# Patient Record
Sex: Female | Born: 1975 | Race: White | Hispanic: No | Marital: Married | Smoking: Never smoker
Health system: Southern US, Community
[De-identification: ages and names within clinical notes are randomized; demographics above are authoritative.]

## PROBLEM LIST (undated history)

## (undated) DIAGNOSIS — I471 Supraventricular tachycardia, unspecified: Secondary | ICD-10-CM

## (undated) DIAGNOSIS — Q210 Ventricular septal defect: Secondary | ICD-10-CM

## (undated) DIAGNOSIS — R002 Palpitations: Secondary | ICD-10-CM

## (undated) DIAGNOSIS — R112 Nausea with vomiting, unspecified: Secondary | ICD-10-CM

## (undated) DIAGNOSIS — K829 Disease of gallbladder, unspecified: Secondary | ICD-10-CM

## (undated) DIAGNOSIS — E8881 Metabolic syndrome: Secondary | ICD-10-CM

## (undated) DIAGNOSIS — Z8742 Personal history of other diseases of the female genital tract: Secondary | ICD-10-CM

## (undated) DIAGNOSIS — I499 Cardiac arrhythmia, unspecified: Secondary | ICD-10-CM

## (undated) DIAGNOSIS — N8003 Adenomyosis of the uterus: Secondary | ICD-10-CM

## (undated) DIAGNOSIS — J069 Acute upper respiratory infection, unspecified: Secondary | ICD-10-CM

## (undated) DIAGNOSIS — N800 Endometriosis of uterus: Secondary | ICD-10-CM

## (undated) DIAGNOSIS — D219 Benign neoplasm of connective and other soft tissue, unspecified: Secondary | ICD-10-CM

## (undated) DIAGNOSIS — Z9189 Other specified personal risk factors, not elsewhere classified: Secondary | ICD-10-CM

## (undated) DIAGNOSIS — R51 Headache: Secondary | ICD-10-CM

## (undated) DIAGNOSIS — R7303 Prediabetes: Secondary | ICD-10-CM

## (undated) DIAGNOSIS — M1990 Unspecified osteoarthritis, unspecified site: Secondary | ICD-10-CM

## (undated) DIAGNOSIS — Z9889 Other specified postprocedural states: Secondary | ICD-10-CM

## (undated) DIAGNOSIS — E559 Vitamin D deficiency, unspecified: Secondary | ICD-10-CM

## (undated) DIAGNOSIS — E282 Polycystic ovarian syndrome: Secondary | ICD-10-CM

## (undated) DIAGNOSIS — Z91018 Allergy to other foods: Secondary | ICD-10-CM

## (undated) DIAGNOSIS — N809 Endometriosis, unspecified: Secondary | ICD-10-CM

## (undated) DIAGNOSIS — D649 Anemia, unspecified: Secondary | ICD-10-CM

## (undated) DIAGNOSIS — E119 Type 2 diabetes mellitus without complications: Secondary | ICD-10-CM

## (undated) DIAGNOSIS — K219 Gastro-esophageal reflux disease without esophagitis: Secondary | ICD-10-CM

## (undated) DIAGNOSIS — R519 Headache, unspecified: Secondary | ICD-10-CM

## (undated) DIAGNOSIS — N970 Female infertility associated with anovulation: Secondary | ICD-10-CM

## (undated) HISTORY — DX: Polycystic ovarian syndrome: E28.2

## (undated) HISTORY — DX: Type 2 diabetes mellitus without complications: E11.9

## (undated) HISTORY — DX: Supraventricular tachycardia: I47.1

## (undated) HISTORY — DX: Benign neoplasm of connective and other soft tissue, unspecified: D21.9

## (undated) HISTORY — PX: OTHER SURGICAL HISTORY: SHX169

## (undated) HISTORY — DX: Palpitations: R00.2

## (undated) HISTORY — PX: DILATION AND CURETTAGE OF UTERUS: SHX78

## (undated) HISTORY — DX: Prediabetes: R73.03

## (undated) HISTORY — DX: Unspecified osteoarthritis, unspecified site: M19.90

## (undated) HISTORY — DX: Metabolic syndrome: E88.81

## (undated) HISTORY — DX: Disease of gallbladder, unspecified: K82.9

## (undated) HISTORY — DX: Endometriosis, unspecified: N80.9

## (undated) HISTORY — DX: Female infertility associated with anovulation: N97.0

## (undated) HISTORY — DX: Supraventricular tachycardia, unspecified: I47.10

## (undated) HISTORY — DX: Vitamin D deficiency, unspecified: E55.9

## (undated) HISTORY — DX: Metabolic syndrome: E88.810

## (undated) HISTORY — DX: Allergy to other foods: Z91.018

## (undated) HISTORY — DX: Other specified personal risk factors, not elsewhere classified: Z91.89

## (undated) HISTORY — DX: Endometriosis of uterus: N80.0

## (undated) HISTORY — DX: Adenomyosis of the uterus: N80.03

---

## 2001-01-01 ENCOUNTER — Ambulatory Visit (HOSPITAL_COMMUNITY): Admission: RE | Admit: 2001-01-01 | Discharge: 2001-01-01 | Payer: Self-pay | Admitting: Family Medicine

## 2001-01-01 ENCOUNTER — Encounter: Payer: Self-pay | Admitting: Family Medicine

## 2001-01-12 ENCOUNTER — Inpatient Hospital Stay (HOSPITAL_COMMUNITY): Admission: AD | Admit: 2001-01-12 | Discharge: 2001-01-14 | Payer: Self-pay | Admitting: Family Medicine

## 2001-01-12 ENCOUNTER — Encounter: Payer: Self-pay | Admitting: Family Medicine

## 2001-01-14 ENCOUNTER — Other Ambulatory Visit: Admission: RE | Admit: 2001-01-14 | Discharge: 2001-01-14 | Payer: Self-pay | Admitting: Obstetrics and Gynecology

## 2001-03-27 ENCOUNTER — Encounter (HOSPITAL_COMMUNITY): Admission: RE | Admit: 2001-03-27 | Discharge: 2001-04-26 | Payer: Self-pay | Admitting: Family Medicine

## 2001-05-06 HISTORY — PX: CHOLECYSTECTOMY: SHX55

## 2002-01-17 ENCOUNTER — Inpatient Hospital Stay (HOSPITAL_COMMUNITY): Admission: EM | Admit: 2002-01-17 | Discharge: 2002-01-20 | Payer: Self-pay | Admitting: Emergency Medicine

## 2002-01-18 ENCOUNTER — Encounter: Payer: Self-pay | Admitting: Internal Medicine

## 2002-01-27 ENCOUNTER — Ambulatory Visit (HOSPITAL_COMMUNITY): Admission: RE | Admit: 2002-01-27 | Discharge: 2002-01-27 | Payer: Self-pay | Admitting: Family Medicine

## 2002-01-27 ENCOUNTER — Encounter: Payer: Self-pay | Admitting: Family Medicine

## 2002-01-29 ENCOUNTER — Encounter: Payer: Self-pay | Admitting: Family Medicine

## 2002-01-29 ENCOUNTER — Encounter (HOSPITAL_COMMUNITY): Admission: RE | Admit: 2002-01-29 | Discharge: 2002-02-28 | Payer: Self-pay | Admitting: Family Medicine

## 2002-08-02 ENCOUNTER — Other Ambulatory Visit: Admission: RE | Admit: 2002-08-02 | Discharge: 2002-08-02 | Payer: Self-pay | Admitting: Obstetrics and Gynecology

## 2002-08-12 ENCOUNTER — Encounter: Payer: Self-pay | Admitting: Obstetrics and Gynecology

## 2002-08-12 ENCOUNTER — Inpatient Hospital Stay (HOSPITAL_COMMUNITY): Admission: RE | Admit: 2002-08-12 | Discharge: 2002-08-12 | Payer: Self-pay | Admitting: Obstetrics and Gynecology

## 2003-09-15 ENCOUNTER — Other Ambulatory Visit: Admission: RE | Admit: 2003-09-15 | Discharge: 2003-09-15 | Payer: Self-pay | Admitting: Gynecology

## 2004-07-20 ENCOUNTER — Ambulatory Visit (HOSPITAL_COMMUNITY): Admission: RE | Admit: 2004-07-20 | Discharge: 2004-07-20 | Payer: Self-pay | Admitting: Family Medicine

## 2004-11-07 ENCOUNTER — Other Ambulatory Visit: Admission: RE | Admit: 2004-11-07 | Discharge: 2004-11-07 | Payer: Self-pay | Admitting: Gynecology

## 2007-11-12 ENCOUNTER — Ambulatory Visit (HOSPITAL_COMMUNITY): Admission: RE | Admit: 2007-11-12 | Discharge: 2007-11-12 | Payer: Self-pay | Admitting: Obstetrics and Gynecology

## 2007-11-18 ENCOUNTER — Other Ambulatory Visit: Admission: RE | Admit: 2007-11-18 | Discharge: 2007-11-18 | Payer: Self-pay | Admitting: Gynecology

## 2008-09-14 ENCOUNTER — Inpatient Hospital Stay (HOSPITAL_COMMUNITY): Admission: AD | Admit: 2008-09-14 | Discharge: 2008-09-18 | Payer: Self-pay | Admitting: Obstetrics & Gynecology

## 2008-09-15 ENCOUNTER — Encounter (INDEPENDENT_AMBULATORY_CARE_PROVIDER_SITE_OTHER): Payer: Self-pay | Admitting: Obstetrics and Gynecology

## 2008-09-20 ENCOUNTER — Ambulatory Visit: Admission: RE | Admit: 2008-09-20 | Discharge: 2008-09-20 | Payer: Self-pay | Admitting: Obstetrics and Gynecology

## 2008-09-20 ENCOUNTER — Encounter: Admission: RE | Admit: 2008-09-20 | Discharge: 2008-10-20 | Payer: Self-pay | Admitting: Obstetrics and Gynecology

## 2008-10-21 ENCOUNTER — Encounter: Admission: RE | Admit: 2008-10-21 | Discharge: 2008-11-19 | Payer: Self-pay | Admitting: Obstetrics and Gynecology

## 2008-11-20 ENCOUNTER — Encounter: Admission: RE | Admit: 2008-11-20 | Discharge: 2008-12-16 | Payer: Self-pay | Admitting: Obstetrics and Gynecology

## 2010-05-31 ENCOUNTER — Other Ambulatory Visit (HOSPITAL_COMMUNITY): Payer: Self-pay | Admitting: Obstetrics and Gynecology

## 2010-05-31 DIAGNOSIS — IMO0002 Reserved for concepts with insufficient information to code with codable children: Secondary | ICD-10-CM

## 2010-05-31 DIAGNOSIS — Q210 Ventricular septal defect: Secondary | ICD-10-CM

## 2010-05-31 DIAGNOSIS — E282 Polycystic ovarian syndrome: Secondary | ICD-10-CM

## 2010-05-31 DIAGNOSIS — O169 Unspecified maternal hypertension, unspecified trimester: Secondary | ICD-10-CM

## 2010-06-22 ENCOUNTER — Other Ambulatory Visit (HOSPITAL_COMMUNITY): Payer: Self-pay | Admitting: Obstetrics and Gynecology

## 2010-06-22 ENCOUNTER — Ambulatory Visit (HOSPITAL_COMMUNITY)
Admission: RE | Admit: 2010-06-22 | Discharge: 2010-06-22 | Disposition: A | Discharge status: Home / Self Care | Payer: Commercial Managed Care - PPO | Source: Ambulatory Visit | Attending: Obstetrics and Gynecology | Admitting: Obstetrics and Gynecology

## 2010-06-22 ENCOUNTER — Encounter (HOSPITAL_COMMUNITY): Payer: Self-pay

## 2010-06-22 DIAGNOSIS — Q210 Ventricular septal defect: Secondary | ICD-10-CM

## 2010-06-22 DIAGNOSIS — O352XX0 Maternal care for (suspected) hereditary disease in fetus, not applicable or unspecified: Secondary | ICD-10-CM | POA: Insufficient documentation

## 2010-06-22 DIAGNOSIS — O34219 Maternal care for unspecified type scar from previous cesarean delivery: Secondary | ICD-10-CM | POA: Insufficient documentation

## 2010-06-22 DIAGNOSIS — O337XX0 Maternal care for disproportion due to other fetal deformities, not applicable or unspecified: Secondary | ICD-10-CM

## 2010-06-22 DIAGNOSIS — E282 Polycystic ovarian syndrome: Secondary | ICD-10-CM

## 2010-06-22 DIAGNOSIS — O10019 Pre-existing essential hypertension complicating pregnancy, unspecified trimester: Secondary | ICD-10-CM | POA: Insufficient documentation

## 2010-06-22 DIAGNOSIS — IMO0002 Reserved for concepts with insufficient information to code with codable children: Secondary | ICD-10-CM

## 2010-06-22 DIAGNOSIS — O169 Unspecified maternal hypertension, unspecified trimester: Secondary | ICD-10-CM

## 2010-07-20 ENCOUNTER — Ambulatory Visit (HOSPITAL_COMMUNITY)
Admission: RE | Admit: 2010-07-20 | Discharge: 2010-07-20 | Disposition: A | Discharge status: Home / Self Care | Payer: Commercial Managed Care - PPO | Source: Ambulatory Visit | Attending: Obstetrics and Gynecology | Admitting: Obstetrics and Gynecology

## 2010-07-20 ENCOUNTER — Other Ambulatory Visit (HOSPITAL_COMMUNITY): Payer: Self-pay | Admitting: Obstetrics and Gynecology

## 2010-07-20 DIAGNOSIS — O352XX0 Maternal care for (suspected) hereditary disease in fetus, not applicable or unspecified: Secondary | ICD-10-CM

## 2010-07-20 DIAGNOSIS — O337XX0 Maternal care for disproportion due to other fetal deformities, not applicable or unspecified: Secondary | ICD-10-CM

## 2010-07-20 DIAGNOSIS — O10019 Pre-existing essential hypertension complicating pregnancy, unspecified trimester: Secondary | ICD-10-CM | POA: Insufficient documentation

## 2010-07-20 DIAGNOSIS — O34219 Maternal care for unspecified type scar from previous cesarean delivery: Secondary | ICD-10-CM

## 2010-07-20 DIAGNOSIS — O2690 Pregnancy related conditions, unspecified, unspecified trimester: Secondary | ICD-10-CM

## 2010-08-04 ENCOUNTER — Inpatient Hospital Stay (HOSPITAL_COMMUNITY)
Admission: AD | Admit: 2010-08-04 | Discharge: 2010-08-04 | Disposition: A | Discharge status: Home / Self Care | Payer: 59 | Source: Ambulatory Visit | Attending: Obstetrics and Gynecology | Admitting: Obstetrics and Gynecology

## 2010-08-04 DIAGNOSIS — O99891 Other specified diseases and conditions complicating pregnancy: Secondary | ICD-10-CM | POA: Insufficient documentation

## 2010-08-04 DIAGNOSIS — W010XXA Fall on same level from slipping, tripping and stumbling without subsequent striking against object, initial encounter: Secondary | ICD-10-CM | POA: Insufficient documentation

## 2010-08-14 LAB — COMPREHENSIVE METABOLIC PANEL
Albumin: 2.1 g/dL — ABNORMAL LOW (ref 3.5–5.2)
Alkaline Phosphatase: 230 U/L — ABNORMAL HIGH (ref 39–117)
BUN: 10 mg/dL (ref 6–23)
BUN: 7 mg/dL (ref 6–23)
CO2: 23 mEq/L (ref 19–32)
Chloride: 105 mEq/L (ref 96–112)
Chloride: 106 mEq/L (ref 96–112)
Creatinine, Ser: 0.46 mg/dL (ref 0.4–1.2)
GFR calc non Af Amer: >60 mL/min (ref >60)
GFR calc non Af Amer: >60 mL/min (ref >60)
Glucose, Bld: 120 mg/dL — ABNORMAL HIGH (ref 70–99)
Glucose, Bld: 129 mg/dL — ABNORMAL HIGH (ref 70–99)
Potassium: 3.7 mEq/L (ref 3.5–5.1)
Total Bilirubin: 0.2 mg/dL — ABNORMAL LOW (ref 0.3–1.2)
Total Bilirubin: 0.4 mg/dL (ref 0.3–1.2)

## 2010-08-14 LAB — URINALYSIS, ROUTINE W REFLEX MICROSCOPIC
Bilirubin Urine: NEGATIVE
Ketones, ur: NEGATIVE mg/dL
Nitrite: NEGATIVE
Urobilinogen, UA: 0.2 mg/dL (ref 0.0–1.0)

## 2010-08-14 LAB — URIC ACID
Uric Acid, Serum: 4.6 mg/dL (ref 2.4–7.0)
Uric Acid, Serum: 5.1 mg/dL (ref 2.4–7.0)

## 2010-08-14 LAB — URINE MICROSCOPIC-ADD ON

## 2010-08-14 LAB — CBC
HCT: 30.8 % — ABNORMAL LOW (ref 36.0–46.0)
HCT: 36.3 % (ref 36.0–46.0)
Hemoglobin: 12.6 g/dL (ref 12.0–15.0)
Hemoglobin: 12.6 g/dL (ref 12.0–15.0)
MCHC: 33.1 g/dL (ref 30.0–36.0)
MCV: 91.8 fL (ref 78.0–100.0)
Platelets: 196 10*3/uL (ref 150–400)
Platelets: 226 10*3/uL (ref 150–400)
RBC: 4.16 MIL/uL (ref 3.87–5.11)
WBC: 10.9 10*3/uL — ABNORMAL HIGH (ref 4.0–10.5)
WBC: 13.4 10*3/uL — ABNORMAL HIGH (ref 4.0–10.5)
WBC: 9.9 10*3/uL (ref 4.0–10.5)

## 2010-08-14 LAB — LACTATE DEHYDROGENASE
LDH: 133 U/L (ref 94–250)
LDH: 139 U/L (ref 94–250)

## 2010-08-31 ENCOUNTER — Ambulatory Visit (HOSPITAL_COMMUNITY)
Admission: RE | Admit: 2010-08-31 | Discharge: 2010-08-31 | Disposition: A | Discharge status: Home / Self Care | Payer: Commercial Managed Care - PPO | Source: Ambulatory Visit | Attending: Obstetrics and Gynecology | Admitting: Obstetrics and Gynecology

## 2010-08-31 ENCOUNTER — Other Ambulatory Visit (HOSPITAL_COMMUNITY): Payer: Self-pay | Admitting: Obstetrics and Gynecology

## 2010-08-31 DIAGNOSIS — E669 Obesity, unspecified: Secondary | ICD-10-CM | POA: Insufficient documentation

## 2010-08-31 DIAGNOSIS — O10019 Pre-existing essential hypertension complicating pregnancy, unspecified trimester: Secondary | ICD-10-CM | POA: Insufficient documentation

## 2010-08-31 DIAGNOSIS — O352XX0 Maternal care for (suspected) hereditary disease in fetus, not applicable or unspecified: Secondary | ICD-10-CM | POA: Insufficient documentation

## 2010-08-31 DIAGNOSIS — O34219 Maternal care for unspecified type scar from previous cesarean delivery: Secondary | ICD-10-CM | POA: Insufficient documentation

## 2010-08-31 DIAGNOSIS — O99210 Obesity complicating pregnancy, unspecified trimester: Secondary | ICD-10-CM | POA: Insufficient documentation

## 2010-09-18 NOTE — Discharge Summary (Signed)
Christina Rodriguez, Christina Rodriguez                ACCOUNT NO.:  [id number]   MEDICAL RECORD NO.:  [id number]          PATIENT TYPE:  INP   LOCATION:  9115                          FACILITY:  [facility]   PHYSICIAN:  Malva Limes, M.D.    DATE OF BIRTH:  June 09, 1975   DATE OF ADMISSION:  09/14/2008  DATE OF DISCHARGE:  09/18/2008                               DISCHARGE SUMMARY   FINAL DIAGNOSES:  Intrauterine gestation at 40 weeks, suspected fetal  macrosomia, pregnancy-induced high blood pressure, history of  supraventricular tachycardia and a small ventricular septal defect,  asthma.   PROCEDURE:  Primary low segment transverse cesarean section.   SURGEON:  Randye Lobo, MD   ASSISTANT:  Gretchen Short, [facility]   COMPLICATIONS:  None.   This 35 year old G1, P0 presents at 25 weeks' gestation for evaluation  of pregnancy-induced hypertension.  The patient had been seen in the  office on May 12 for routine visit, was noted to have some elevated  blood pressures.  The patient had been on Cardizem to control her  supraventricular tachycardia during the pregnancy, but now she is  experiencing some edema and a trace of protein in her urine.  The  patient's antepartum course up to this point have been complicated by  this history of supraventricular tachycardia.  The patient also has a  small ventricular septal defect.  She had been cleared by Cardiology for  delivery.  She also has asthma and had been on Advair and albuterol  during her pregnancy and is obese.  Otherwise, antepartum course, her  blood pressure did not start elevating until this point.  The patient  upon admission did have an ultrasound that documented estimated fetal  weight of over 4300 g, BPP was 8/8 and presentation was vertex.  The  patient was continued to be elevated and monitored for her blood  pressure and signs of preeclampsia.  At this point, a decision was made  to proceed with a cesarean section secondary to suspected fetal  macrosomia.  The patient does not have gestational diabetes mellitus.  The patient was taken to the operating room on Sep 15, 2008, by Dr.  Conley Simmonds where a primary low segment transverse cesarean section was  performed with the delivery of an 8-pound 7-ounce female infant with  Apgars of 9 and 9.  Delivery went without complications.  The patient's  postoperative course was benign without any significant fevers.  The  patient was felt ready for discharge on postoperative day #3.  She was  sent home on a regular diet, told to decrease activities, told to  continue her prenatal vitamins, was given a prescription for Darvocet-N  100 to take 1-2 every 4-6 hours as needed for pain, told she could use  ibuprofen up to 600 mg every 6 hours as needed for pain, was to follow  up in our office in 4 weeks.  Instructions and precautions were reviewed  with the patient.   LABORATORY FINDINGS ON DISCHARGE:  The patient had a hemoglobin of 9.8,  white blood cell count of 9.9, and platelets of 196,000.  The patient's  PIH panel remained within normal limits throughout her hospital course.      Leilani Able, P.A.-C.    ______________________________  Malva Limes, M.D.    MB/MEDQ  D:  09/28/2008  T:  09/29/2008  Job:  [id number]

## 2010-09-18 NOTE — Op Note (Signed)
Christina Rodriguez, Christina Rodriguez                ACCOUNT NO.:  [id number]   MEDICAL RECORD NO.:  [id number]          PATIENT TYPE:  INP   LOCATION:  9172                          FACILITY:  [facility]   PHYSICIAN:  Randye Lobo, M.D.   DATE OF BIRTH:  1976/01/22   DATE OF PROCEDURE:  09/15/2008  DATE OF DISCHARGE:                               OPERATIVE REPORT   PREOPERATIVE DIAGNOSES:  1. Intrauterine gestation at 40 weeks.  2. Suspected fetal macrosomia.  3. Pregnancy-induced hypertension.   POSTOPERATIVE DIAGNOSES:  1. Intrauterine gestation at 40 weeks.  2. Suspected fetal macrosomia.  3. Pregnancy-induced hypertension.   PROCEDURE:  Primary low segment transverse cesarean section.   SURGEON:  Randye Lobo, MD   ASSISTANT:  Gretchen Short, PA-C   ANESTHESIA:  Spinal.   INTRAVENOUS FLUIDS:  2050 mL Ringer's lactate.   ESTIMATED BLOOD LOSS:  400 mL.   URINE OUTPUT:  125 mL.   COMPLICATIONS:  None.   INDICATIONS FOR THE PROCEDURE:  The patient is a 35 year old gravida 1,  para 10 Caucasian female at 35 weeks' gestation, who was seen in the  office on Sep 14, 2008 for routine visit and was noted to have slightly  elevated blood pressure.  The patient had been on Cardizem to control  supraventricular tachycardia during the pregnancy.  The patient was also  exhibiting 2+ lower extremity edema and trace protein in the urine.  The  patient was sent to the Maternity Admissions Unit for evaluation where  she had a PIH panel which was all normal.  The formal urinalysis  documented 30 mg of protein per deciliter.  The patient did have an  ultrasound documenting an estimated fetal weight of 4357 grams and a  biophysical profile was 8/8.  The presentation was vertex.  The patient  was noted to have some elevated blood pressures.  A decision was made to  admit the patient for observation of blood pressure elevation and signs  of preeclampsia and a plan was made to proceed with a primary cesarean  section for suspected fetal macrosomia the following morning.  Of note,  the patient did have a normal glucose tolerance test and hemoglobin A1c  during the pregnancy.   A discussion was held with the patient on the morning of Sep 15, 2008,  regarding her diagnoses and a recommendation to proceed with a cesarean  section and this was done after risks, benefits, and alternatives were  reviewed.   FINDINGS:  A viable female was delivered at 9:25 a.m., now with Apgars  of 8 at 1 minute and 9 at 5 minutes.  The weight was 8 pounds 7 ounces.  The amniotic fluid was clear.  The uterus, tubes, and ovaries were  unremarkable.  The placenta had a normal insertion of a 3-vessel cord.   SPECIMENS:  The placenta was sent to Pathology.   PROCEDURE:  The patient was escorted from her room on Labor and Delivery  down to the preop hold area.  From there, she was then taken to the C-  section suite where her spinal anesthetic  was administered.  The patient  was placed in the supine position with a left lateral tilt. Then, the  abdomen was sterilely prepped and a Foley catheter placed in the  bladder.  She was sterilely draped.   The procedure began by making a Pfannenstiel incision sharply with a  scalpel.  The incision was carried down to the fascia using a scalpel  and monopolar cautery for hemostasis.  The fascia was incised in the  midline with the scalpel and the incision was extended bilaterally with  the Mayo scissors.  The rectus muscles were dissected from the fascia  superiorly and inferiorly.  The rectus muscles were sharply divided in  the midline.  The parietal peritoneum was elevated with 2 hemostat  clamps and entered sharply.  The peritoneal incision was extended  cranially and caudally.   The lower uterine segment was exposed and the bladder flap was sharply  created.  A transverse lower uterine segment incision was then created  with a scalpel.  The incision was extended bluntly  bilaterally.  Membranes were ruptured.  A hand was inserted through the uterine  incision and the vertex was delivered without difficulty.  The nares and  mouth were suctioned and the remainder of the newborn was then  delivered.  The umbilical cord was doubly clamped and cut.  The newborn  was carried over to the awaiting pediatricians in vigorous condition.   Cord blood was then obtained and the placenta was manually extracted and  sent to Pathology.   The uterus was then wiped clean with a moistened lap pad and all  remaining products of conception were removed.  The uterus was  exteriorized for its closure.  The incision was closed with a double  layer closure of #1 chromic.  The first was a running locked layer and  the second was an imbricating layer.   The uterus was returned to the peritoneal cavity which was irrigated and  suctioned.  There was a small area of bleeding noted in the right  midportion of the incision and this responded well to a horizontal  mattress suture of #1 chromic.  Hemostasis was then good and the abdomen  was therefore closed.   The parietal peritoneum was closed with a running suture of 3-0 Vicryl.  The rectus muscles were reapproximated in the midline with interrupted  sutures of #1 chromic.  The fascia was closed with a running suture of 0  Vicryl.  The subcutaneous layer was irrigated, suctioned, and made  hemostatic with monopolar cautery.  The subcutaneous layer was closed  with interrupted sutures of 2-0 plain gut suture and the skin was closed  with staples.  A sterile pressure bandage was placed over the incision.   This concluded the patient's procedure.  There were no complications.  All needle, instrument, and sponge counts were correct.  The patient was  escorted to the recovery room in stable and awake condition.      Randye Lobo, M.D.  Electronically Signed     BES/MEDQ  D:  09/15/2008  T:  09/15/2008  Job:  [id number]

## 2010-09-21 NOTE — H&P (Signed)
NAME:  Christina Rodriguez, Christina Rodriguez                          ACCOUNT NO.:  [id number]   MEDICAL RECORD NO.:  [id number]                   PATIENT TYPE:  EMS   LOCATION:  ED                                   FACILITY:  [facility]   PHYSICIAN:  Gracelyn Nurse, M.D.              DATE OF BIRTH:  03/01/1976   DATE OF ADMISSION:  01/17/2002  DATE OF DISCHARGE:                                HISTORY & PHYSICAL   CHIEF COMPLAINT:  Abdominal pain.   HISTORY OF PRESENT ILLNESS:  This is a 35 year old white female who presents  with right upper quadrant abdominal pain that started earlier today.  She  has had no nausea, vomiting, or diarrhea.  No fever but feels that she has  had chills.  She never had symptoms like this before.   PAST MEDICAL HISTORY:  1. Hypertension.  2. Asthma.  3. Iron deficiency anemia.   ALLERGIES:  No known drug allergies.   CURRENT MEDICATIONS:  1. Cardizem CD 180 mg q.d.  2. Zyrtec 10 mg q.d.  3. Multivitamin with folate q.d.   SOCIAL HISTORY:  She does not smoke or drink alcohol.  She is married.  Two  children.  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   FAMILY HISTORY:  Mother, 32, is in good health.  Father, age 54, has  diabetes and hypertension.   REVIEW OF SYSTEMS:  As per HPI.  All other systems reviewed and are normal.   PHYSICAL EXAMINATION:  VITAL SIGNS:  Temperature 97.2, pulse 54,  respirations 16, blood pressure 116/74.  GENERAL:  Well-nourished white female in no acute distress.  HEENT:  Pupils are equal, round, and reactive to light.  Extraocular  movements intact.  Oral mucosa is moist.  Oropharynx is clear.  CARDIOVASCULAR:  Regular rate and rhythm.  No murmurs.  LUNGS:  Clear to auscultation.  ABDOMEN:  Soft, nondistended.  Bowel sounds are positive.  She is tender in  the right upper quadrant with positive  Murphy sign.  EXTREMITIES:  Trace edema lower extremities.  NEUROLOGIC:  Cranial nerves II-XII grossly intact.  SKIN:  Moist.  No rash.   ADMISSION LABORATORY DATA:  White blood cell count 12.3, hemoglobin 12.7,  platelets 225.  Sodium 137, potassium 3.6, chloride 109, CO2 27, BUN 10,  creatinine 0.7, glucose 112, total bilirubin 0.8, alkaline phosphatase 64,  SGOT 23, SGPT 45, total protein 7.4.   ASSESSMENT AND PLAN:  1. Abdominal pain:  With location of abdominal pain in the right upper     quadrant and elevated white count and mildly elevated SGPT, this is     likely early cholecystitis.  Will go ahead and admit and start     antibiotics and get an abdominal ultrasound in the morning and surgical     consult if necessary.  2. Hypertension:  Will continue her current medications.  Gracelyn Nurse, M.D.    JDJ/MEDQ  D:  01/17/2002  T:  01/18/2002  Job:  [id number]

## 2010-09-21 NOTE — Consult Note (Signed)
NAME:  Christina Rodriguez, Christina Rodriguez                          ACCOUNT NO.:  [id number]   MEDICAL RECORD NO.:  [id number]                   PATIENT TYPE:  INP   LOCATION:  A311                                 FACILITY:  [facility]   PHYSICIAN:  Dirk Dress. Katrinka Blazing, M.D.                DATE OF BIRTH:  24-Jul-1975   DATE OF CONSULTATION:  DATE OF DISCHARGE:                                   CONSULTATION   HISTORY OF PRESENT ILLNESS:  [age]-year-old female who presented on  the evening of 14 September with recurrent severe abdominal pain, without  nausea or vomiting.  The patient states that she developed indigestion in  the early afternoon of 14 September.  This started while she was returning  from the beach.  It became more severe  as she approached home.  After she  arrived she was brought to the emergency room by her husband because of  recurrent severe pain.   On evaluation in the emergency room she was noted to have an acute abdomen  with epigastric and right upper quadrant tenderness.  She had mild  leukocytosis with white count of 12,300 and she had moderate epigastric and  right upper quadrant tenderness.  She was admitted with the presumptive  diagnosis of acure cholecystitis.  Ultrasound was done earlier today and it  shows multiple gallstones with thickening of the gallbladder wall compatible  with acute cholecystitis.  Clinically she is improved.  She is now scheduled  for laparoscopic cholecystectomy and has been counselled for this.   PAST HISTORY:  Reveals that she has a history of hypertension, iron-  deficiency anemia secondary to severe hypermenorrhea and she has seasonal  allergies.   MEDICATIONS ON ADMISSION:  1. Cardizem CD 180 mg q.d.  2. Zyrtec 10 mg q.d.  3. Multivitamin with iron.   PHYSICAL EXAMINATION:   GENERAL:  On examination she appears to be in no acute distress.   VITAL SIGNS:  Blood pressure is 120/90, pulse 80, respirations 18 and  temperature 98.   HEENT:   No evidence of jaundice.  Sclerae and conjunctivae are clear.  Pupils equal and react.  Extraocular movements intact.  Oropharynx  unremarkable.  Normal oral mucosa.  Gag reflex is intact.   NECK:  Supple.  No JVD or bruit.  No adenopathy or thyromegaly.   CHEST:  Clear to auscultation.   HEART:  Regular rate and rhythm without murmur, gallop or rub.   ABDOMEN:  Mildly obese, soft with mild epigastric and right upper quadrant  tenderness without guarding or rebound.  She has good active bowel sounds.   EXTREMITIES:  No cyanosis, clubbing or edema.   NEUROLOGIC EXAMINATION:  No focal motor, sensory or cerebellar deficit.   IMPRESSION:  1. Acute cholecystitis with cholelithiasis.  2. Hypertension.  3. Iron-deficiency anemia.   PLAN:  The patient is scheduled for laparoscopic cholecystectomy in the A.M.  Dirk Dress. Katrinka Blazing, M.D.    LCS/MEDQ  D:  01/18/2002  T:  01/18/2002  Job:  [id number]

## 2010-09-25 NOTE — Op Note (Signed)
   NAME:  Christina Rodriguez, Christina Rodriguez                          ACCOUNT NO.:  [id number]   MEDICAL RECORD NO.:  [id number]                   PATIENT TYPE:  INP   LOCATION:  A311                                 FACILITY:  [facility]   PHYSICIAN:  Dirk Dress. Katrinka Blazing, M.D.                DATE OF BIRTH:  [birth year]   DATE OF PROCEDURE:  01/19/2002  DATE OF DISCHARGE:                                 OPERATIVE REPORT   PREOPERATIVE DIAGNOSES:  1. Cholelithiasis.  2. Acute cholecystitis.   POSTOPERATIVE DIAGNOSES:  1. Cholelithiasis.  2. Acute cholecystitis.   PROCEDURE:  Laparoscopic cholecystectomy.   SURGEON:  Dirk Dress. Katrinka Blazing, M.D.   DESCRIPTION OF PROCEDURE:  Under general anesthesia, the patient's abdomen  was prepped and draped in a sterile field.  A supraumbilical incision was  made and a Veress needle was inserted without difficulty.  Position was  confirmed by saline drop test.  Using a Visiport guide, a 10-mm port was  placed under videoscopic guidance.  Laparoscope was placed and an acutely  inflamed edematous gallbladder was noted.  Standard right upper quadrant  incisions were made and a 10-mm port and two 5-mm ports were placed under  videoscopic guidance.  The gallbladder was positioned.  The cystic duct was  dissected, clipped with five clips, and divided.  There were three cystic  artery branches.  Each was dissected, clipped with three clips, and divided.  The gallbladder was separated from the infrahepatic bed.  The gallbladder  wall was thick and there was significant oozing of blood from the  gallbladder bed.  The gallbladder was retrieved in an EndoCatch device.  Irrigation was carried out and electrocautery was carried out in the bed of  the liver until there was no bleeding.  There was no evidence of any bile  leak.  There was minimal drainage otherwise.  A kidney drain was not placed.  The patient tolerated the procedure  well.  CO2 was allowed to escape from the abdomen and then the  ports were  removed.  The incisions were closed with 0 Dexon on the fascia of the larger  incisions and staples on the skin.  The patient tolerated the procedure  well.  She was awakened from anesthesia uneventfully, transferred to a bed,  and taken to the Post Anesthetic Care Unit.                                                 Dirk Dress. Katrinka Blazing, M.D.    LCS/MEDQ  D:  01/19/2002  T:  01/20/2002  Job:  [id number]   cc:   Donna Bernard, M.D.  [address]  [place]  [place] [address]  Fax: [phone]

## 2010-09-25 NOTE — Discharge Summary (Signed)
[facility]  Patient:    Christina Rodriguez, Christina Rodriguez Visit Number: [id number] MRN: [id number]          Service Type: Attending:  Donna Bernard, M.D. Dictated by:   Donna Bernard, M.D. Adm. Date:  01/12/01 Disc. Date: 01/14/01                             Discharge Summary  FINAL DIAGNOSES: 1. Severe iron deficiency anemia. 2. Exacerbation of asthma. 3. Menorrhagia.  FINAL DISPOSITION:  Patient discharged to home.  DISCHARGE MEDICATIONS: 1. Stop Toprol. 2. Start Cardizem CD 180 mg each morning. 3. Prednisone taper as directed. 4. Ventolin 2 sprays q.4h. while awake. 5. Levaquin daily for 7 days. 6. Chromagen forte 1 at least b.i.d. with food.  FOLLOWUP:  Follow up in the office in 1 week.  Report any significant bleeding.  INITIAL HISTORY AND PHYSICAL:  Please history and physical as dictated.  HOSPITAL COURSE:  This patient is a 35 year old white female admitted to the hospital with cough, fever, trouble breathing, wheezing. She also had an excessive amount of vaginal bleeding noted.  She was admitted and is being evaluated by Dr. Francee Piccolo for this who had attempted to hormonally maintain her dysfunctional uterine bleeding.  Due to a combination of factors it was felt she needed to be in the hospital.  Dr. Emelda Fear was consulted.  He assessed the patient and made recommendations, please see his notes.  Dr. Ladona Horns. Neijstrom, M.D. is also consulted.  He evaluated the patient and he felt that her problems were most likely to severe iron deficiency anemia with iron intolerance.  Over the next several days the patient improved.  Due to her reactive airways we elected to stop her chronic beta blocker therapy and switch her to Cardizem.  Over the next several days the patient improved.  On the day of discharge she is feeling better.  She is breathing easier. Though she still had significant ongoing problems it was felt that this could be managed on an  outpatient basis.  The patient was discharged with the diagnosis and disposition as noted above. Dictated by:   Donna Bernard, M.D. Attending:  Donna Bernard, M.D. DD:  03/11/01 TD:  03/11/01 Job: [id number]

## 2010-09-25 NOTE — Discharge Summary (Signed)
   NAMEEVANGELA, Christina Rodriguez                            ACCOUNT NO.:  [id number]   MEDICAL RECORD NO.:  [id number]                   PATIENT TYPE:   LOCATION:                                       FACILITY:   PHYSICIAN:  Dirk Dress. Katrinka Blazing, M.D.                DATE OF BIRTH:   DATE OF ADMISSION:  01/17/2002  DATE OF DISCHARGE:  01/20/2002                                 DISCHARGE SUMMARY   DISCHARGE DIAGNOSES:  1. Acute cholecystitis with cholelithiasis.  2. Hypertension.  3. Iron-deficiency anemia.   SPECIAL PROCEDURE:  Laparoscopic cholecystectomy September 16.   DISPOSITION:  The patient discharged home in stable, satisfactory condition.   DISCHARGE MEDICATIONS:  1. Tiazac 180 mg q.d.  2. Phenergan 25 mg q.i.d. p.r.n.  3. Tylenol 650 mg 2 q.4h. p.r.n.  4. Tylox 1 or 2 q.4h. p.r.n.   FOLLOW UP:  The patient is scheduled to be seen in the office on September  25.   SUMMARY:  This is a 35 year old female who presented on the evening of  September 14 with recurrent severe abdominal pain.  She developed  indigestion on the evening of September 14.  Her pain became more severe and  she was brought to the emergency room by her husband.  On evaluation, she  had severe epigastric and right upper quadrant tenderness with a white count  of 12,300.  She was admitted and an ultrasound was done which showed  multiple gallstones with thickening of the gallbladder wall compatible with  acute cholecystitis.  The patient was seen in consultation and was scheduled  for surgery.  She underwent laparoscopic cholecystectomy uneventfully on  September 16.  She had an inflamed gallbladder with multiple stones.  She  had nausea and vomiting on the first postoperative day but this resolved.  She was discharged home on the evening of the first postoperative day in  satisfactory condition.                                               Dirk Dress. Katrinka Blazing, M.D.    LCS/MEDQ  D:  04/24/2002  T:  04/26/2002   Job:  [id number]   cc:   Lorin Picket A. Gerda Diss, M.D.  [address], [address]  [place]  [place] [address]  Fax: [phone]

## 2010-10-12 ENCOUNTER — Ambulatory Visit (HOSPITAL_COMMUNITY)
Admission: RE | Admit: 2010-10-12 | Discharge: 2010-10-12 | Disposition: A | Discharge status: Home / Self Care | Payer: 59 | Source: Ambulatory Visit | Attending: Obstetrics and Gynecology | Admitting: Obstetrics and Gynecology

## 2010-10-12 ENCOUNTER — Other Ambulatory Visit (HOSPITAL_COMMUNITY): Payer: Self-pay | Admitting: Obstetrics and Gynecology

## 2010-10-12 DIAGNOSIS — O352XX0 Maternal care for (suspected) hereditary disease in fetus, not applicable or unspecified: Secondary | ICD-10-CM | POA: Insufficient documentation

## 2010-10-12 DIAGNOSIS — O2690 Pregnancy related conditions, unspecified, unspecified trimester: Secondary | ICD-10-CM

## 2010-10-12 DIAGNOSIS — E669 Obesity, unspecified: Secondary | ICD-10-CM | POA: Insufficient documentation

## 2010-10-12 DIAGNOSIS — O34219 Maternal care for unspecified type scar from previous cesarean delivery: Secondary | ICD-10-CM | POA: Insufficient documentation

## 2010-10-12 DIAGNOSIS — O10019 Pre-existing essential hypertension complicating pregnancy, unspecified trimester: Secondary | ICD-10-CM | POA: Insufficient documentation

## 2010-11-09 ENCOUNTER — Ambulatory Visit (HOSPITAL_COMMUNITY)
Admission: RE | Admit: 2010-11-09 | Discharge: 2010-11-09 | Disposition: A | Discharge status: Home / Self Care | Payer: 59 | Source: Ambulatory Visit | Attending: Obstetrics and Gynecology | Admitting: Obstetrics and Gynecology

## 2010-11-09 ENCOUNTER — Other Ambulatory Visit: Payer: Self-pay

## 2010-11-09 ENCOUNTER — Encounter (HOSPITAL_COMMUNITY): Payer: Self-pay

## 2010-11-09 DIAGNOSIS — O2690 Pregnancy related conditions, unspecified, unspecified trimester: Secondary | ICD-10-CM

## 2010-11-09 DIAGNOSIS — O352XX0 Maternal care for (suspected) hereditary disease in fetus, not applicable or unspecified: Secondary | ICD-10-CM | POA: Insufficient documentation

## 2010-11-09 DIAGNOSIS — O99210 Obesity complicating pregnancy, unspecified trimester: Secondary | ICD-10-CM | POA: Insufficient documentation

## 2010-11-09 DIAGNOSIS — O34219 Maternal care for unspecified type scar from previous cesarean delivery: Secondary | ICD-10-CM | POA: Insufficient documentation

## 2010-11-09 DIAGNOSIS — O10019 Pre-existing essential hypertension complicating pregnancy, unspecified trimester: Secondary | ICD-10-CM | POA: Insufficient documentation

## 2010-11-09 DIAGNOSIS — E669 Obesity, unspecified: Secondary | ICD-10-CM | POA: Insufficient documentation

## 2010-11-09 HISTORY — DX: Nausea with vomiting, unspecified: R11.2

## 2010-11-09 HISTORY — DX: Ventricular septal defect: Q21.0

## 2010-11-09 HISTORY — DX: Acute upper respiratory infection, unspecified: J06.9

## 2010-11-09 HISTORY — DX: Cardiac arrhythmia, unspecified: I49.9

## 2010-11-09 HISTORY — DX: Other specified postprocedural states: Z98.890

## 2010-11-09 LAB — SURGICAL PCR SCREEN
MRSA, PCR: NEGATIVE
Staphylococcus aureus: NEGATIVE

## 2010-11-09 LAB — CBC
Hemoglobin: 11.4 g/dL — ABNORMAL LOW (ref 12.0–15.0)
MCH: 29.4 pg (ref 26.0–34.0)
Platelets: 294 10*3/uL (ref 150–400)
RBC: 3.88 MIL/uL (ref 3.87–5.11)
WBC: 9.1 10*3/uL (ref 4.0–10.5)

## 2010-11-09 NOTE — Patient Instructions (Addendum)
20 Christina Rodriguez  11/09/2010   Your procedure is scheduled on:  Friday July 13th  Report to [facility] at 10  AM.  Call this number if you have problems the morning of surgery: [phone]   Remember:   Do not eat food:After Midnight.  Do not drink clear liquids: After Midnight.  Take these medicines the morning of surgery with A SIP OF WATER: cardizem, advair and bring inhalers with you day of surgery   Do not wear jewelry, make-up or nail polish.  Do not bring valuables to the hospital.  Contacts, dentures or bridgework may not be worn into surgery.  Leave suitcase in the car. After surgery it may be brought to your room.  For patients admitted to the hospital, checkout time is 11:00 AM the day of discharge.   Patients discharged the day of surgery will not be allowed to drive home.  Name and phone number of your driver:Brandon [phone]  Special Instructions: CHG shower per instructions   Please read over the following fact sheets that you were given: surgical site infections

## 2010-11-10 LAB — RPR: RPR Ser Ql: NONREACTIVE

## 2010-11-15 ENCOUNTER — Other Ambulatory Visit: Payer: Self-pay | Admitting: Obstetrics and Gynecology

## 2010-11-15 ENCOUNTER — Encounter (HOSPITAL_COMMUNITY): Payer: Self-pay | Admitting: Obstetrics and Gynecology

## 2010-11-15 NOTE — H&P (Addendum)
Christina Rodriguez is a 35 y.o. female presenting for scheduled repeat c/section.   HPI:  Pt is a G2P1001 who presents at 39 weeks for a scheduled repeat c-section.  She has a h/o a prior LTCS for macrosomia. Patient declines trial of labor. 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 Patient's current pregnancy has been complicated by maternal obesity, suspected macrosomia and a history of chronic hypertension for which she is on Cardizem. Additionally, the patient is a history of a VSD. She's been asymptomatic from this during the pregnancy, however a fetal echo was performed during the pregnancy for evaluation of her cardiac anatomy of the baby. She has been followed jointly, with maternal-fetal medicine. Her most recent ultrasound for estimated fetal weight demonstrated an estimated fetal weight greater than 90th percentile. OB History    Grav Para Term Preterm Abortions TAB SAB Ect Mult Living   2 1 1             Past Medical History  Diagnosis Date  . PONV (postoperative nausea and vomiting)   . Dysrhythmia     history of svt-controlled on cardizem-diagnosed in 1999-  . Asthma     albuterol inhaler prn-recent uri-using more freq  . Recurrent upper respiratory infection (URI)     resolving  . VSD (ventricular septal defect)     history of -no problems   Past Surgical History  Procedure Date  . Cesarean section 2010  . Cholecystectomy 2003   Family History: Noncontributory Social History:  reports that she has never smoked. She does not have any smokeless tobacco history on file. She reports that she does not drink alcohol or use illicit drugs. All: Shellfish, beta blocker, soybeans No current facility-administered medications for this encounter. Current outpatient prescriptions:acetaminophen (TYLENOL) 500 MG tablet, Take 500-1,000 mg by mouth every 6 (six) hours as needed. TAKES FOR PAIN , Disp: , Rfl: ;  albuterol (PROVENTIL,VENTOLIN) 90 MCG/ACT inhaler, Inhale 2  puffs into the lungs every 6 (six) hours as needed. TAKES FOR SHORTNESS OF BREATH , Disp: , Rfl: ;  diltiazem (CARDIZEM CD) 240 MG 24 hr capsule, Take 240 mg by mouth daily.  , Disp: , Rfl:  Fluticasone-Salmeterol (ADVAIR DISKUS) 250-50 MCG/DOSE AEPB, Inhale 1 puff into the lungs 2 (two) times daily.  , Disp: , Rfl: ;  Prenatal Vit-Fe Psac Cmplx-FA (PRENATAL MULTIVITAMIN) 60-1 MG tablet, Take 1 tablet by mouth daily.  , Disp: , Rfl:    ROS: Patient endorses active fetal movement, denies leakage of fluid or bleeding    There were no vitals taken for this visit. Physical exam: Gen.: Alert and oriented x3 in no apparent distress Cardiovascular: Regular rate and rhythm, no murmurs gallops or rubs Pulmonary: Clear to auscultation bilaterally Abdomen: Obese, soft, nontender, nondistended, gravid Cervix: Deferred  Prenatal labs: ABO, Rh: A+ Antibody: RH neg Rubella: Immune RPR: NON REACTIVE (07/06 1415)  HBsAg: Neg HIV: Neg GBS: Neg Genetic Screening: Declined One Hr GTT: 122 GC: Neg Ch: Neg   Assessment/Plan: 35 year old gravida 2 para 1001 who presents at 62 weeks estimated gestational age for a scheduled repeat cesarean section 1) admit 2) Ancef 2 g on call to the OR. Plan to administer prior to skin incision. 3) SCDs for DVT prophylaxis  Jacobi Nile H. 11/15/2010, 12:52 PM

## 2010-11-16 ENCOUNTER — Encounter (HOSPITAL_COMMUNITY): Payer: Self-pay

## 2010-11-16 ENCOUNTER — Inpatient Hospital Stay (HOSPITAL_COMMUNITY): Payer: 59

## 2010-11-16 ENCOUNTER — Encounter (HOSPITAL_COMMUNITY)
Admission: RE | Disposition: A | Discharge status: Home / Self Care | Payer: Self-pay | Source: Ambulatory Visit | Attending: Obstetrics and Gynecology

## 2010-11-16 ENCOUNTER — Encounter (HOSPITAL_COMMUNITY): Payer: Self-pay | Admitting: Obstetrics and Gynecology

## 2010-11-16 ENCOUNTER — Inpatient Hospital Stay (HOSPITAL_COMMUNITY)
Admission: RE | Admit: 2010-11-16 | Discharge: 2010-11-19 | DRG: 765 | Disposition: A | Discharge status: Home / Self Care | Payer: 59 | Source: Ambulatory Visit | Attending: Obstetrics and Gynecology | Admitting: Obstetrics and Gynecology

## 2010-11-16 ENCOUNTER — Inpatient Hospital Stay (HOSPITAL_COMMUNITY): Admission: RE | Admit: 2010-11-16 | Payer: 59 | Source: Ambulatory Visit | Admitting: Obstetrics and Gynecology

## 2010-11-16 ENCOUNTER — Encounter (HOSPITAL_COMMUNITY): Payer: Self-pay | Admitting: *Deleted

## 2010-11-16 DIAGNOSIS — E669 Obesity, unspecified: Secondary | ICD-10-CM | POA: Diagnosis present

## 2010-11-16 DIAGNOSIS — O34219 Maternal care for unspecified type scar from previous cesarean delivery: Principal | ICD-10-CM | POA: Diagnosis present

## 2010-11-16 DIAGNOSIS — O3660X0 Maternal care for excessive fetal growth, unspecified trimester, not applicable or unspecified: Secondary | ICD-10-CM | POA: Diagnosis present

## 2010-11-16 DIAGNOSIS — O1002 Pre-existing essential hypertension complicating childbirth: Secondary | ICD-10-CM | POA: Diagnosis present

## 2010-11-16 SURGERY — Surgical Case
Anesthesia: Spinal | Site: Abdomen | Wound class: Clean Contaminated

## 2010-11-16 MED ORDER — WITCH HAZEL-GLYCERIN EX PADS: MEDICATED_PAD | CUTANEOUS | Status: DC | PRN

## 2010-11-16 MED ORDER — OXYTOCIN 20 UNITS IN LACTATED RINGERS INFUSION - SIMPLE
125.0000 mL/h | INTRAVENOUS | Status: AC
  Administered 2010-11-16 – 2010-11-17 (×2): 125 mL/h via INTRAVENOUS
  Filled 2010-11-16 (×2): qty 1000

## 2010-11-16 MED ORDER — ONDANSETRON HCL 4 MG/2ML IJ SOLN: 4.0000 mg | Freq: Three times a day (TID) | INTRAMUSCULAR | Status: DC | PRN

## 2010-11-16 MED ORDER — NALBUPHINE SYRINGE 5 MG/0.5 ML
5.0000 mg | INJECTION | INTRAMUSCULAR | Status: AC | PRN
  Filled 2010-11-16: qty 1

## 2010-11-16 MED ORDER — IBUPROFEN 200 MG PO TABS
600.0000 mg | ORAL_TABLET | Freq: Four times a day (QID) | ORAL | Status: DC | PRN
  Administered 2010-11-16 – 2010-11-19 (×2): 600 mg via ORAL

## 2010-11-16 MED ORDER — DILTIAZEM HCL ER COATED BEADS 240 MG PO CP24
240.0000 mg | ORAL_CAPSULE | Freq: Every day | ORAL | Status: DC
  Administered 2010-11-17 – 2010-11-19 (×3): 240 mg via ORAL
  Filled 2010-11-16 (×4): qty 1

## 2010-11-16 MED ORDER — PRENATAL PLUS 27-1 MG PO TABS
1.0000 | ORAL_TABLET | Freq: Every day | ORAL | Status: DC
  Administered 2010-11-17 – 2010-11-19 (×3): 1 via ORAL
  Filled 2010-11-16 (×3): qty 1

## 2010-11-16 MED ORDER — CEFAZOLIN SODIUM 1-5 GM-% IV SOLN: 1.0000 g | Freq: Once | INTRAVENOUS | Status: DC

## 2010-11-16 MED ORDER — METOCLOPRAMIDE HCL 5 MG/ML IJ SOLN: 10.0000 mg | Freq: Once | INTRAMUSCULAR | Status: DC | PRN

## 2010-11-16 MED ORDER — NALBUPHINE SYRINGE 5 MG/0.5 ML: 5.0000 mg | INJECTION | INTRAMUSCULAR | Status: AC | PRN

## 2010-11-16 MED ORDER — MORPHINE SULFATE (PF) 0.5 MG/ML IJ SOLN
INTRAMUSCULAR | Status: DC | PRN
  Administered 2010-11-16: .15 mg via EPIDURAL

## 2010-11-16 MED ORDER — KETOROLAC TROMETHAMINE 30 MG/ML IJ SOLN: 30.0000 mg | Freq: Four times a day (QID) | INTRAMUSCULAR | Status: AC | PRN

## 2010-11-16 MED ORDER — CEFAZOLIN SODIUM 1-5 GM-% IV SOLN: 1.0000 g | INTRAVENOUS | Status: DC

## 2010-11-16 MED ORDER — FENTANYL CITRATE 0.05 MG/ML IJ SOLN: 25.0000 ug | INTRAMUSCULAR | Status: DC | PRN

## 2010-11-16 MED ORDER — EPHEDRINE SULFATE 50 MG/ML IJ SOLN
INTRAMUSCULAR | Status: DC | PRN
  Administered 2010-11-16: 20 mg via INTRAVENOUS
  Administered 2010-11-16: 10 mg via INTRAVENOUS
  Administered 2010-11-16: 20 mg via INTRAVENOUS

## 2010-11-16 MED ORDER — CEFAZOLIN SODIUM-DEXTROSE 2-3 GM-% IV SOLR
2.0000 g | Freq: Once | INTRAVENOUS | Status: DC
  Filled 2010-11-16: qty 50

## 2010-11-16 MED ORDER — SIMETHICONE 80 MG PO CHEW: 80.0000 mg | CHEWABLE_TABLET | ORAL | Status: DC | PRN

## 2010-11-16 MED ORDER — ALBUTEROL SULFATE HFA 108 (90 BASE) MCG/ACT IN AERS
2.0000 | INHALATION_SPRAY | Freq: Four times a day (QID) | RESPIRATORY_TRACT | Status: DC | PRN
  Filled 2010-11-16: qty 6.7

## 2010-11-16 MED ORDER — PHENYLEPHRINE HCL 10 MG/ML IJ SOLN
INTRAMUSCULAR | Status: DC | PRN
  Administered 2010-11-16: 75 ug via INTRAVENOUS

## 2010-11-16 MED ORDER — FLUTICASONE-SALMETEROL 250-50 MCG/DOSE IN AEPB
1.0000 | INHALATION_SPRAY | Freq: Two times a day (BID) | RESPIRATORY_TRACT | Status: DC
  Administered 2010-11-16 – 2010-11-19 (×5): 1 via RESPIRATORY_TRACT
  Filled 2010-11-16: qty 14

## 2010-11-16 MED ORDER — NALOXONE HCL 0.4 MG/ML IJ SOLN: 0.4000 mg | INTRAMUSCULAR | Status: DC | PRN

## 2010-11-16 MED ORDER — DIPHENHYDRAMINE HCL 25 MG PO CAPS
25.0000 mg | ORAL_CAPSULE | ORAL | Status: DC | PRN
  Filled 2010-11-16: qty 1

## 2010-11-16 MED ORDER — SCOPOLAMINE 1 MG/3DAYS TD PT72
1.0000 | MEDICATED_PATCH | TRANSDERMAL | Status: DC
  Administered 2010-11-16: 1.5 mg via TRANSDERMAL

## 2010-11-16 MED ORDER — SODIUM CHLORIDE 0.9 % IV SOLN
1.0000 ug/kg/h | INTRAVENOUS | Status: DC | PRN
  Filled 2010-11-16: qty 2.5

## 2010-11-16 MED ORDER — SIMETHICONE 80 MG PO CHEW
80.0000 mg | CHEWABLE_TABLET | Freq: Three times a day (TID) | ORAL | Status: DC
  Administered 2010-11-16 – 2010-11-19 (×8): 80 mg via ORAL

## 2010-11-16 MED ORDER — SCOPOLAMINE 1 MG/3DAYS TD PT72: 1.0000 | MEDICATED_PATCH | Freq: Once | TRANSDERMAL | Status: DC

## 2010-11-16 MED ORDER — ONDANSETRON HCL 4 MG/2ML IJ SOLN
INTRAMUSCULAR | Status: DC | PRN
  Administered 2010-11-16: 4 mg via INTRAVENOUS

## 2010-11-16 MED ORDER — LACTATED RINGERS IV SOLN
INTRAVENOUS | Status: DC
Start: 2010-11-16 — End: 2010-11-16
  Administered 2010-11-16 (×4): via INTRAVENOUS

## 2010-11-16 MED ORDER — IBUPROFEN 600 MG PO TABS
600.0000 mg | ORAL_TABLET | Freq: Four times a day (QID) | ORAL | Status: DC
  Administered 2010-11-16 – 2010-11-19 (×10): 600 mg via ORAL
  Filled 2010-11-16 (×11): qty 1

## 2010-11-16 MED ORDER — ONDANSETRON HCL 4 MG/2ML IJ SOLN: 4.0000 mg | INTRAMUSCULAR | Status: DC | PRN

## 2010-11-16 MED ORDER — BUPIVACAINE HCL 0.75 % IJ SOLN
INTRAMUSCULAR | Status: DC | PRN
  Administered 2010-11-16: 15 mg

## 2010-11-16 MED ORDER — METOCLOPRAMIDE HCL 5 MG/ML IJ SOLN
10.0000 mg | Freq: Three times a day (TID) | INTRAMUSCULAR | Status: DC | PRN
  Administered 2010-11-16: 10 mg via INTRAVENOUS

## 2010-11-16 MED ORDER — MEPERIDINE HCL 25 MG/ML IJ SOLN: 6.2500 mg | INTRAMUSCULAR | Status: DC | PRN

## 2010-11-16 MED ORDER — DIPHENHYDRAMINE HCL 25 MG PO CAPS
25.0000 mg | ORAL_CAPSULE | Freq: Four times a day (QID) | ORAL | Status: DC | PRN
  Administered 2010-11-17: 25 mg via ORAL
  Filled 2010-11-16: qty 1

## 2010-11-16 MED ORDER — TETANUS-DIPHTH-ACELL PERTUSSIS 5-2.5-18.5 LF-MCG/0.5 IM SUSP
0.5000 mL | Freq: Once | INTRAMUSCULAR | Status: DC
  Filled 2010-11-16: qty 0.5

## 2010-11-16 MED ORDER — ONDANSETRON HCL 4 MG PO TABS
4.0000 mg | ORAL_TABLET | ORAL | Status: DC | PRN
  Administered 2010-11-17 (×3): 4 mg via ORAL
  Filled 2010-11-16 (×3): qty 1

## 2010-11-16 MED ORDER — SENNOSIDES-DOCUSATE SODIUM 8.6-50 MG PO TABS
1.0000 | ORAL_TABLET | Freq: Every day | ORAL | Status: DC
  Administered 2010-11-16 – 2010-11-18 (×3): 1 via ORAL

## 2010-11-16 MED ORDER — OXYTOCIN 20 UNITS IN LACTATED RINGERS INFUSION - SIMPLE
INTRAVENOUS | Status: DC | PRN
  Administered 2010-11-16 (×2): 20 [IU] via INTRAVENOUS

## 2010-11-16 MED ORDER — ALBUTEROL 90 MCG/ACT IN AERS: 2.0000 | INHALATION_SPRAY | Freq: Four times a day (QID) | RESPIRATORY_TRACT | Status: DC | PRN

## 2010-11-16 MED ORDER — KETOROLAC TROMETHAMINE 30 MG/ML IJ SOLN: 15.0000 mg | Freq: Once | INTRAMUSCULAR | Status: DC | PRN

## 2010-11-16 MED ORDER — CEFAZOLIN SODIUM 1-5 GM-% IV SOLN
INTRAVENOUS | Status: DC | PRN
  Administered 2010-11-16: 2 g via INTRAVENOUS

## 2010-11-16 MED ORDER — ZOLPIDEM TARTRATE 5 MG PO TABS: 5.0000 mg | ORAL_TABLET | Freq: Every evening | ORAL | Status: DC | PRN

## 2010-11-16 MED ORDER — RHO D IMMUNE GLOBULIN 1500 UNIT/2ML IJ SOLN: 300.0000 ug | Freq: Once | INTRAMUSCULAR | Status: DC

## 2010-11-16 MED ORDER — KETOROLAC TROMETHAMINE 60 MG/2ML IM SOLN
60.0000 mg | Freq: Once | INTRAMUSCULAR | Status: AC | PRN
  Administered 2010-11-16: 60 mg via INTRAMUSCULAR

## 2010-11-16 MED ORDER — OXYCODONE-ACETAMINOPHEN 5-325 MG PO TABS
1.0000 | ORAL_TABLET | ORAL | Status: DC | PRN
Start: 2010-11-16 — End: 2010-11-19

## 2010-11-16 MED ORDER — DIPHENHYDRAMINE HCL 50 MG/ML IJ SOLN: 12.5000 mg | INTRAMUSCULAR | Status: DC | PRN

## 2010-11-16 MED ORDER — SODIUM CHLORIDE 0.9 % IJ SOLN: 3.0000 mL | INTRAMUSCULAR | Status: DC | PRN

## 2010-11-16 MED ORDER — METOCLOPRAMIDE HCL 5 MG/ML IJ SOLN
10.0000 mg | Freq: Once | INTRAMUSCULAR | Status: AC
  Administered 2010-11-16: 10 mg via INTRAVENOUS

## 2010-11-16 MED ORDER — MENTHOL 3 MG MT LOZG: 1.0000 | LOZENGE | OROMUCOSAL | Status: DC | PRN

## 2010-11-16 MED ORDER — FENTANYL CITRATE 0.05 MG/ML IJ SOLN
INTRAMUSCULAR | Status: DC | PRN
  Administered 2010-11-16 (×4): 25 ug via INTRAVENOUS

## 2010-11-16 MED ORDER — DIPHENHYDRAMINE HCL 50 MG/ML IJ SOLN: 25.0000 mg | INTRAMUSCULAR | Status: DC | PRN

## 2010-11-16 SURGICAL SUPPLY — 26 items
CLOTH BEACON ORANGE TIMEOUT ST (SAFETY) ×3 IMPLANT
DRAPE UTILITY XL STRL (DRAPES) ×3 IMPLANT
DRESSING TELFA 8X3 (GAUZE/BANDAGES/DRESSINGS) ×3 IMPLANT
ELECT REM PT RETURN 9FT ADLT (ELECTROSURGICAL) ×3
ELECTRODE REM PT RTRN 9FT ADLT (ELECTROSURGICAL) ×2 IMPLANT
EXTRACTOR VACUUM M CUP 4 TUBE (SUCTIONS) IMPLANT
GAUZE SPONGE 4X4 12PLY STRL LF (GAUZE/BANDAGES/DRESSINGS) ×6 IMPLANT
GLOVE BIO SURGEON STRL SZ7 (GLOVE) ×6 IMPLANT
GOWN BRE IMP SLV AUR LG STRL (GOWN DISPOSABLE) ×6 IMPLANT
KIT ABG SYR 3ML LUER SLIP (SYRINGE) IMPLANT
NDL HYPO 25X5/8 SAFETYGLIDE (NEEDLE) IMPLANT
NEEDLE HYPO 25X5/8 SAFETYGLIDE (NEEDLE) IMPLANT
NS IRRIG 1000ML POUR BTL (IV SOLUTION) ×3 IMPLANT
PACK C SECTION WH (CUSTOM PROCEDURE TRAY) ×3 IMPLANT
PAD ABD 7.5X8 STRL (GAUZE/BANDAGES/DRESSINGS) ×3 IMPLANT
RTRCTR C-SECT PINK 25CM LRG (MISCELLANEOUS) IMPLANT
RTRCTR C-SECT PINK 34CM XLRG (MISCELLANEOUS) IMPLANT
SLEEVE SCD COMPRESS KNEE MED (MISCELLANEOUS) IMPLANT
STAPLER VISISTAT 35W (STAPLE) ×2 IMPLANT
SUT CHROMIC 1 CTX 36 (SUTURE) ×6 IMPLANT
SUT PDS AB 0 CTX 60 (SUTURE) ×3 IMPLANT
SUT VIC AB 2-0 CT1 27 (SUTURE) ×3
SUT VIC AB 2-0 CT1 TAPERPNT 27 (SUTURE) ×2 IMPLANT
TOWEL OR 17X24 6PK STRL BLUE (TOWEL DISPOSABLE) ×6 IMPLANT
TRAY FOLEY CATH 14FR (SET/KITS/TRAYS/PACK) ×3 IMPLANT
WATER STERILE IRR 1000ML POUR (IV SOLUTION) ×3 IMPLANT

## 2010-11-16 NOTE — Interval H&P Note (Signed)
History and Physical Interval Note:   11/16/2010   11:40 AM   Christina Rodriguez  has presented today for surgery, with the diagnosis of Previous Cesarean Section   The various methods of treatment have been discussed with the patient and family. After consideration of risks, benefits and other options for treatment, the patient has consented to  Procedure(s): CESAREAN SECTION as a surgical intervention .  I have reviewed the patients' chart and labs.  Questions were answered to the patient's satisfaction.     Almon Hercules  MD  No changes to H&P

## 2010-11-16 NOTE — Brief Op Note (Signed)
11/16/2010  1:05 PM  PATIENT:  Christina Rodriguez  34 y.o. female  PRE-OPERATIVE DIAGNOSIS:  1) history of prior cesarean section declines part of labor 2) 39 week intrauterine pregnancy 3) suspected macrosomia POST-OPERATIVE DIAGNOSIS:  Same   PROCEDURE:  Procedure(s): Repeat low transverse cesarean section   SURGEON:  Waynard Reeds, MD  PHYSICIAN ASSISTANT: NA  ASSISTANTS:  Wyvonnia Lora, MD  ANESTHESIA:   epidural  ESTIMATED BLOOD LOSS: 800 BLOOD ADMINISTERED:none  DRAINS: Urinary Catheter (Foley)   LOCAL MEDICATIONS USED:  NONE  SPECIMEN:  Source of Specimen:  placenta  DISPOSITION OF SPECIMEN:  L&D for disposal  COUNTS:  YES  TOURNIQUET:  N/A   DICTATION #:     PATIENT DISPOSITION:  PACU - hemodynamically stable.   Delay start of Pharmacological VTE agent (>24hrs) due to surgical blood loss or risk of bleeding:  no

## 2010-11-16 NOTE — Anesthesia Postprocedure Evaluation (Signed)
  Anesthesia Post-op Note  Patient: Christina Rodriguez  Procedure(s) Performed:  CESAREAN SECTION - Repeat   Patient Location: PACU  Anesthesia Type: Spinal  Level of Consciousness: awake, alert  and oriented  Airway and Oxygen Therapy: Patient Spontanous Breathing  Post-op Pain: none  Post-op Assessment:   Post-op Vital Signs: Reviewed and stable  Complications: No apparent anesthesia complications

## 2010-11-16 NOTE — Anesthesia Preprocedure Evaluation (Signed)
Anesthesia Evaluation  Name, MR# and DOB Patient awake, Patient confused and Patient unresponsive  General Assessment Comment  Reviewed: Allergy & Precautions, H&P  and Patient's Chart, lab work & pertinent test results  History of Anesthesia Complications (+) PONV  Airway Mallampati: III TM Distance: >3 FB Neck ROM: Full    Dental No notable dental hx (+) Teeth Intact   Pulmonary    pulmonary exam normal   Cardiovascular Dysrhythmias: controlled with Rx. Regular Normal   Neuro/PsychNegative Neurological ROS Negative Psych ROS  GI/Hepatic/Renal negative GI ROS, negative Liver ROS, and negative Renal ROS (+)       Endo/Other  Negative Endocrine ROS (+)   Abdominal   Musculoskeletal negative musculoskeletal ROS (+)  Hematology negative hematology ROS (+)   Peds  Reproductive/Obstetrics (+) Pregnancy   Anesthesia Other Findings             Anesthesia Physical Anesthesia Plan  ASA: III  Anesthesia Plan: Spinal   Post-op Pain Management:    Induction:   Airway Management Planned:   Additional Equipment:   Intra-op Plan:   Post-operative Plan:   Informed Consent: I have reviewed the patients History and Physical, chart, labs and discussed the procedure including the risks, benefits and alternatives for the proposed anesthesia with the patient or authorized representative who has indicated his/her understanding and acceptance.     Plan Discussed with: Anesthesiologist (AP)  Anesthesia Plan Comments:         Anesthesia Quick Evaluation

## 2010-11-16 NOTE — Transfer of Care (Signed)
Immediate Anesthesia Transfer of Care Note  Patient: Christina Rodriguez  Procedure(s) Performed:  CESAREAN SECTION - Repeat   Patient Location: PACU  Anesthesia Type: Spinal  Level of Consciousness: awake and oriented  Airway & Oxygen Therapy: Patient Spontanous Breathing  Post-op Assessment: Report given to PACU RN and Post -op Vital signs reviewed and stable  Post vital signs: Reviewed and stable  Complications: No apparent anesthesia complications

## 2010-11-16 NOTE — Consult Note (Signed)
[facility] [place]  Delivery Note   11/16/2010  12:09 PM  I was called to the operating room at the request of the patient's obstetrician.   Prenatal Hx:  Prior c/section.  Chronic hypertension.  Suspected macrosomia.   PMH:  VSD  Intrapartum Hx:  No labor.  Elective scheduled c/section at term.  Delivery:  Uncomplicated vertex delivery.  The baby was vigorous.  She appears to be large for gestation.  ___________________ Electronically signed by:  Ruben Gottron, MD Neonatologist

## 2010-11-16 NOTE — Progress Notes (Signed)
Mom has PCOS, baby conceived IVF, history of low milk supply, used Fenugreek and Reglan with last baby but never produced an adequate amount of breastmilk. Breastmilk dried up at 3 months. Mom is already postpumping, Did not observe feeding, baby breastfed at 2020 at 15-20 minutes. Basics reviewed, Lactation brochure given to parents, community resources for breastfeeding moms discussed, mom advised of outpatient services available if needed. Advised to offer breast every 2-3 hours or on demand, post pump for 15 minutes. Baby had low blood sugar after birth, supplemented with formula due to this, mom has supplemented one time because baby was fussy. Advised to limit supplements unless medically necessary (another low blood sugar). If supplement [place] medicine dropper of slow flow nipple and limit to 5ml.

## 2010-11-16 NOTE — Op Note (Signed)
Preoperative diagnosis: 1) 39 week intrauterine pregnancy 2) history of a prior cesarean section declines trial of labor 3) suspected macrosomia  Postoperative diagnosis: Same  Procedure: Repeat low transverse cesarean section  Surgeon: Dr. Waynard Reeds  Asst.: Dr. Duard Larsen  Anesthesia: Spinal  Operative findings: Vigorous female infant in the vertex presentation weighing 10 lbs. 1 oz. with Apgar scores of 9 at 1 minute and 9 at 5 minutes. Normal-appearing ovaries tubes and uterus  Specimen placenta  Disposition of specimen to labor and delivery for disposal  Estimated blood loss 800 cc  Complications: None  Disposition of patient: To PACU in stable condition following the procedure  Procedure: Ms. Diven is a 35 year old G2 P1001 who presents at 2 weeks and 0 days estimated gestational age for a repeat cesarean section for history of a prior C-section for macrosomia he declines trial of labor. Following the appropriate informed consent the patient was brought to the operating room where spinal anesthesia was administered and found to be adequate. She was placed in the dorsal supine position with a leftward tilt. She was prepped and draped in the normal sterile fashion. A scalpel was then used to make a Pfannenstiel skin incision which was carried down to through underlying layers of soft tissue to the fascia. The fascial incision was extended laterally with Mayo scissors. The superior aspect of the fascial incision was grasped with Coker clamps x2, tented up, and the underlying rectus muscle was dissected off sharply with the electrocautery unit. The same procedure was repeated on the inferior aspect of the fascial incision. The rectus muscles were then separated in the midline, entered bluntly, the incision was then extended superiorly and inferiorly with good visualization of the bladder. The Alexis  retractor was then deployed. The vesicouterine peritoneum was then tented up, entered  sharply with the Metzenbaum scissors, the incision was extended laterally with the Metzenbaums, and the bladder flap was created digitally. The scalpel was then used to make a low transverse incision on the uterus which has been asked tented with blunt dissection. Bandage scissors were used to further extend the uterine incision. The fetal vertex was then identified, rotated, and delivered through the uterine incision followed by the infant's body. The infant cried vigorously on the operative field, was bulb suctioned, the cord was clamped and cut, and the infant was passed to the waiting neonatologist. The placenta was then spontaneously delivered. The uterus was exteriorized and cleared of all clot and debris. The uterine incision was repaired with #1 chromic in a running locked fashion. Several figure-of-eight sutures were required to achieve complete hemostasis of the uterine incision. The ovaries and tubes were inspected and found to be normal. The uterus was then returned to the abdominal cavity, the abdominal cavity was cleared of all clot and debris, and the Alexis retractor was removed. The abdominal peritoneum was reapproximated with 2-0 Vicryl in a running fashion. The rectus muscles were reapproximated with #1 chromic in a running fashion. The fascia was closed with a looped PDS in a running fashion. The subcutaneous tissue was reapproximated with 2-0 plain gut suture in an interrupted fashion. And the skin was closed with staples. All sponge and needle counts were correct x2 patient tolerated the procedure well was brought to the recovery room in stable condition following the procedure.

## 2010-11-16 NOTE — Anesthesia Procedure Notes (Signed)
Spinal Block  Patient location during procedure: OR Start time: 11/16/2010 11:47 AM Staffing Anesthesiologist: Yony Roulston A. Performed by: anesthesiologist  Preanesthetic Checklist Completed: patient identified, site marked, surgical consent, pre-op evaluation, timeout performed, IV checked, risks and benefits discussed and monitors and equipment checked Spinal Block Patient position: sitting Prep: site prepped and draped and DuraPrep Patient monitoring: heart rate, cardiac monitor, continuous pulse ox and blood pressure Approach: midline Location: L3-4 Injection technique: single-shot Needle Needle type: Sprotte  Needle gauge: 24 G Needle length: 9 cm Needle insertion depth: 7 cm Assessment Sensory level: T4

## 2010-11-17 LAB — CBC
HCT: 28.9 % — ABNORMAL LOW (ref 36.0–46.0)
Hemoglobin: 9.4 g/dL — ABNORMAL LOW (ref 12.0–15.0)
MCH: 29.7 pg (ref 26.0–34.0)
MCV: 91.2 fL (ref 78.0–100.0)
RBC: 3.17 MIL/uL — ABNORMAL LOW (ref 3.87–5.11)

## 2010-11-17 MED ORDER — ACETAMINOPHEN 500 MG PO TABS
1000.0000 mg | ORAL_TABLET | Freq: Four times a day (QID) | ORAL | Status: DC | PRN
  Administered 2010-11-17 – 2010-11-19 (×5): 1000 mg via ORAL
  Filled 2010-11-17 (×2): qty 2

## 2010-11-17 NOTE — Progress Notes (Signed)
  Patient is eating, ambulating, voiding.  Pain control is good.  Filed Vitals:   11/16/10 1655 11/16/10 2200 11/17/10 0207 11/17/10 0600  BP: 111/71 102/70 113/72 121/81  Pulse: 76 81 75 89  Temp: 98.3 F (36.8 C) 98.2 F (36.8 C) 98.2 F (36.8 C) 98.8 F (37.1 C)  TempSrc: Oral Oral Oral Oral  Resp: 20 20 18 18   Height:      Weight:      SpO2: 98% 96% 97%    Lungs CTA Cor RRR Fundus firm, incisions intact. Perineum without swelling.  Lab Results  Component Value Date   WBC 9.7 11/17/2010   HGB 9.4* 11/17/2010   HCT 28.9* 11/17/2010   MCV 91.2 11/17/2010   PLT 219 11/17/2010     A+/Imm  A/P  Routine care.  Expect d/c per plan.    Kaj Vasil A

## 2010-11-18 NOTE — Progress Notes (Signed)
Patient is eating, ambulating, voiding.  Pain control is good.  Filed Vitals:   11/17/10 1000 11/17/10 1535 11/17/10 2100 11/18/10 0625  BP: 112/68 116/80 118/82 125/79  Pulse: 82 87 76 75  Temp: 97.8 F (36.6 C) 98.2 F (36.8 C) 98 F (36.7 C) 98.5 F (36.9 C)  TempSrc: Oral Oral Oral Oral  Resp: 20 19 18 18   Height:      Weight:      SpO2: 97%       lungs:   clear to auscultation cor:    RRR Abdomen:  soft, appropriate tenderness, incisions intact and without erythema or                       exudate. ex:    no cords   Lab Results  Component Value Date   WBC 9.7 11/17/2010   HGB 9.4* 11/17/2010   HCT 28.9* 11/17/2010   MCV 91.2 11/17/2010   PLT 219 11/17/2010     Rh +/Immune   A/P  Routine post op and postpartum care.  Expect d/c per plan.  Percocet for pain control.  Iron for anemia.

## 2010-11-18 NOTE — Progress Notes (Deleted)
Patient is eating, ambulating, voiding.  Pain control is good.  Filed Vitals:   11/17/10 1000 11/17/10 1535 11/17/10 2100 11/18/10 0625  BP: 112/68 116/80 118/82 125/79  Pulse: 82 87 76 75  Temp: 97.8 F (36.6 C) 98.2 F (36.8 C) 98 F (36.7 C) 98.5 F (36.9 C)  TempSrc: Oral Oral Oral Oral  Resp: 20 19 18 18   Height:      Weight:      SpO2: 97%       Fundus firm Perineum without swelling.  Lab Results  Component Value Date   WBC 9.7 11/17/2010   HGB 9.4* 11/17/2010   HCT 28.9* 11/17/2010   MCV 91.2 11/17/2010   PLT 219 11/17/2010       A/P  Routine care.  Expect d/c per plan.    Tulio Facundo A

## 2010-11-19 ENCOUNTER — Encounter (HOSPITAL_COMMUNITY): Payer: Self-pay | Admitting: *Deleted

## 2010-11-19 MED ORDER — IBUPROFEN 600 MG PO TABS: 600.0000 mg | ORAL_TABLET | Freq: Four times a day (QID) | ORAL | Status: AC | PRN

## 2010-11-19 NOTE — Discharge Summary (Signed)
Obstetric Discharge Summary Reason for Admission: cesarean section Prenatal Procedures: ultrasound Intrapartum Procedures: cesarean: low cervical, transverse Postpartum Procedures: none Complications-Operative and Postpartum: none  Hemoglobin  Date Value Range Status  11/17/2010 9.4* 12.0-15.0 (g/dL) Final     HCT  Date Value Range Status  11/17/2010 28.9* 36.0-46.0 (%) Final    Discharge Diagnoses: Term Pregnancy-delivered  Discharge Information: Date: 11/19/2010  Diet: routine and regular Medications: Ibuprophen Condition: stable Instructions:  Discharge to: home   Newborn Data: Live born  Information for the patient's newborn:  Arlita, Buffkin Girl Elesha [[id number]]  female ; APGAR , ; weight ;  Home with mother.  ANDERSON,MARK E 11/19/2010, 12:13 PM

## 2010-11-19 NOTE — Progress Notes (Signed)
Pt is POD 3.  Doing well. Ready for d/c.

## 2010-11-19 NOTE — Progress Notes (Signed)
Nursing observed.  Mom assisted w/minor positioning changes.  Multiple swallows heard (R breast).  Mom encouraged.  L breast noted to have inverted "Coke-bottled" appearance w/areola and nipple placement.  Requested that Mom continue pumping after feeds until baby has surpassed birth weight.  Mom also told that Fenugreek is contraindicated in those w/asthma & HTN (of which Mom has both). Lurline Hare Tushka

## 2010-11-27 ENCOUNTER — Ambulatory Visit (HOSPITAL_COMMUNITY)
Admit: 2010-11-27 | Discharge: 2010-11-27 | Disposition: A | Discharge status: Home / Self Care | Payer: 59 | Attending: Obstetrics and Gynecology | Admitting: Obstetrics and Gynecology

## 2010-11-27 NOTE — Progress Notes (Signed)
BABY:  Christina Rodriguez  DOB [birth year]  BW 10.1.4  DISCHARGE WEIGHT 9-4 11/19/10  WT AT MD ON 11/22/10  9-2  MOM: Christina Rodriguez  PATIENT HER FOR FEEDING ASSESSMENT WITH 11 DAY OLD BABY.  PATIENT HAS HX OF POOR MILK SUPPLY WITH FIRST BABY EVEN AFTER PUMPING, REGLAN AND FENUGREEK.  PATIENTS BREASTS VERY SOFT AND SHE STATES NEVER HAS FELT FULLNESS.  BABY BREASTFEEDS EVERY 3 HOUR FOR 15 30 MIN AND NEEDS PC OF 2-2.5 OZ OF FORMULA BY BOTTLE.  PATIENT HAS A NEW MEDELA PIS SHE HAS NEVER USED.  SHE REPORTS USING HER MANUAL PUMP SOME OBTAINING ABOUT 10 CC'S.  BABY OBSERVED ON RIGHT BREAST FOR .  LATCH FAIR AND RELATCHED A FEW TIMES FOR BETTER DEPTH ALTHOUGH BABY TENDS TO SLIDE TO NIPPLE.  NUTRITIVE SUCKING FOR 6-7 MINUTES THEN A LOT OF NON- NUTRITIVE.  PC WEIGHT ON 1ST SIDE 10CCS.  BABY THEN FED ON LEFT BREAST FOR 5 MIN. WITH LITTLE SWALLOWING.  SNS  SET UP AND BABY TOOK 25 CC'S WELL.  PLAN IS TO CONTINUE BREASTFEEDING EVERY THREE HOURS AND USE SNS OR BOTTLE TO PC 2-3 OZ OF SUPPLEMENT.  ENCOURAGED TO BEGIN PC PUMPING WITH DEBP AND CALL MD TO DISCUSS POSSIBLE REGLAN [place].  PATIENT TO CALL us FOR F/U APPOINTMENT.

## 2010-12-04 ENCOUNTER — Encounter (HOSPITAL_COMMUNITY): Payer: Self-pay | Admitting: Obstetrics and Gynecology

## 2012-12-15 ENCOUNTER — Other Ambulatory Visit: Payer: Self-pay | Admitting: Nurse Practitioner

## 2012-12-15 MED ORDER — NORETHIN ACE-ETH ESTRAD-FE 1.5-30 MG-MCG PO TABS: 1.0000 | ORAL_TABLET | Freq: Every day | ORAL | Status: DC

## 2012-12-25 ENCOUNTER — Other Ambulatory Visit: Payer: Self-pay | Admitting: Obstetrics and Gynecology

## 2013-02-08 ENCOUNTER — Other Ambulatory Visit: Payer: Self-pay | Admitting: Nurse Practitioner

## 2013-02-08 MED ORDER — METFORMIN HCL ER 500 MG PO TB24: 1000.0000 mg | ORAL_TABLET | Freq: Every day | ORAL | Status: DC

## 2013-02-10 ENCOUNTER — Encounter: Payer: Self-pay | Admitting: Nurse Practitioner

## 2013-02-10 ENCOUNTER — Ambulatory Visit (INDEPENDENT_AMBULATORY_CARE_PROVIDER_SITE_OTHER): Payer: 59 | Admitting: Nurse Practitioner

## 2013-02-10 VITALS — BP 132/88 | Ht 68.0 in

## 2013-02-10 DIAGNOSIS — M722 Plantar fascial fibromatosis: Secondary | ICD-10-CM

## 2013-02-10 MED ORDER — METHYLPREDNISOLONE ACETATE 40 MG/ML IJ SUSP: 40.0000 mg | Freq: Once | INTRAMUSCULAR | Status: DC

## 2013-02-11 ENCOUNTER — Encounter: Payer: Self-pay | Admitting: Nurse Practitioner

## 2013-02-11 NOTE — Progress Notes (Signed)
Subjective:  Presents for complaints of medial area heel pain for the past few months. Has now gotten to the point where she has pain with weightbearing. Started off with pain in the morning and when she first gets up from sitting. Has tried different shoes and gel inserts with no improvement. No history of injury.  Objective:   BP 132/88  Ht 5\' 8"  (1.727 m) NAD alert, active. Localized area of tenderness noted on the right medial heel area just distal to the medial malleolus. No erythema or warmth. Area was injected with 1 cc lidocaine with epi mixed with 20 mg Depo-Medrol by Dr. Brett Canales. Patient tolerated procedure well.  Assessment:Plantar fasciitis of right foot - Plan: methylPREDNISolone acetate (DEPO-MEDROL) injection 40 mg  Plan: Expect gradual improvement of symptoms, recheck if worsens or persists.

## 2013-03-24 ENCOUNTER — Ambulatory Visit (INDEPENDENT_AMBULATORY_CARE_PROVIDER_SITE_OTHER): Payer: 59 | Admitting: Nurse Practitioner

## 2013-03-24 DIAGNOSIS — J329 Chronic sinusitis, unspecified: Secondary | ICD-10-CM

## 2013-03-24 MED ORDER — PREDNISONE 20 MG PO TABS: ORAL_TABLET | ORAL | Status: DC

## 2013-03-24 MED ORDER — AZITHROMYCIN 250 MG PO TABS: ORAL_TABLET | ORAL | Status: DC

## 2013-03-24 MED ORDER — METHYLPREDNISOLONE ACETATE 40 MG/ML IJ SUSP
40.0000 mg | Freq: Once | INTRAMUSCULAR | Status: AC
  Administered 2013-03-24: 40 mg via INTRAMUSCULAR

## 2013-03-25 ENCOUNTER — Encounter: Payer: Self-pay | Admitting: Nurse Practitioner

## 2013-03-25 NOTE — Progress Notes (Signed)
Subjective:  Presents complaints of cough and congestion for the past 6-7 days. No fever. Spells of coughing. Mainly nonproductive. Producing slight green mucus mainly in the mornings. Slight wheeze at times. Used her albuterol inhaler right before office visit. Frontal area headache. No ear pain. No sore throat.  Objective:   There were no vitals taken for this visit. NAD. Alert, oriented. TMs clear effusion, no erythema. Pharynx injected with PND noted. Neck supple with mild soft nontender adenopathy. Lungs clear. Heart regular rate rhythm.  Assessment:Rhinosinusitis - Plan: methylPREDNISolone acetate (DEPO-MEDROL) injection 40 mg  Plan: Meds ordered this encounter  Medications  . DISCONTD: azithromycin (ZITHROMAX Z-PAK) 250 MG tablet    Sig: Take 2 tablets (500 mg) on  Day 1,  followed by 1 tablet (250 mg) once daily on Days 2 through 5.    Dispense:  6 each    Refill:  0    Order Specific Question:  Supervising Provider    Answer:  Merlyn Albert [[id number]]  . methylPREDNISolone acetate (DEPO-MEDROL) injection 40 mg    Sig:   . predniSONE (DELTASONE) 20 MG tablet    Sig: 3 po qd x 3 d then 2 po qd x 3 d then 1 po qd x 3 d    Dispense:  18 tablet    Refill:  0    Order Specific Question:  Supervising Provider    Answer:  Merlyn Albert [[id number]]  . azithromycin (ZITHROMAX Z-PAK) 250 MG tablet    Sig: Take 2 tablets (500 mg) on  Day 1,  followed by 1 tablet (250 mg) once daily on Days 2 through 5.    Dispense:  6 each    Refill:  0    Order Specific Question:  Supervising Provider    Answer:  Riccardo Dubin   Given prescription for prednisone if no improvement in symptoms or if wheezing worsens. Continue OTC meds as directed for cough. Recheck if worsens or persists.

## 2013-04-28 ENCOUNTER — Other Ambulatory Visit: Payer: Self-pay | Admitting: Nurse Practitioner

## 2013-04-28 MED ORDER — TRIAMCINOLONE ACETONIDE 0.1 % MT PSTE: 1.0000 "application " | PASTE | Freq: Two times a day (BID) | OROMUCOSAL | Status: DC

## 2013-05-07 ENCOUNTER — Encounter: Payer: Self-pay | Admitting: Nurse Practitioner

## 2013-05-07 ENCOUNTER — Other Ambulatory Visit: Payer: Self-pay | Admitting: *Deleted

## 2013-05-07 ENCOUNTER — Ambulatory Visit (INDEPENDENT_AMBULATORY_CARE_PROVIDER_SITE_OTHER): Payer: 59 | Admitting: Nurse Practitioner

## 2013-05-07 VITALS — BP 118/80 | Temp 98.5°F | Ht 68.0 in

## 2013-05-07 DIAGNOSIS — J45901 Unspecified asthma with (acute) exacerbation: Secondary | ICD-10-CM

## 2013-05-07 DIAGNOSIS — J069 Acute upper respiratory infection, unspecified: Secondary | ICD-10-CM

## 2013-05-07 DIAGNOSIS — J209 Acute bronchitis, unspecified: Secondary | ICD-10-CM

## 2013-05-07 MED ORDER — LEVOFLOXACIN 500 MG PO TABS: 500.0000 mg | ORAL_TABLET | Freq: Every day | ORAL | Status: DC

## 2013-05-07 MED ORDER — ALBUTEROL SULFATE (2.5 MG/3ML) 0.083% IN NEBU: INHALATION_SOLUTION | RESPIRATORY_TRACT | Status: DC

## 2013-05-07 MED ORDER — ALBUTEROL SULFATE HFA 108 (90 BASE) MCG/ACT IN AERS: 2.0000 | INHALATION_SPRAY | RESPIRATORY_TRACT | Status: DC | PRN

## 2013-05-07 MED ORDER — PREDNISONE 20 MG PO TABS: ORAL_TABLET | ORAL | Status: DC

## 2013-05-07 MED ORDER — ALBUTEROL SULFATE (2.5 MG/3ML) 0.083% IN NEBU
2.5000 mg | INHALATION_SOLUTION | Freq: Once | RESPIRATORY_TRACT | Status: AC
  Administered 2013-05-07: 2.5 mg via RESPIRATORY_TRACT

## 2013-05-10 ENCOUNTER — Encounter: Payer: Self-pay | Admitting: Nurse Practitioner

## 2013-05-10 NOTE — Progress Notes (Signed)
Subjective:  Presents for complaints of continued cough and wheezing. Began the Friday before Christmas. Just completed a Z-Pak. Finished a short course of prednisone today. No fever. Cough is improved continues to have frequent nonproductive spasmodic cough. Slight wheeze which is better. Initially airflow was very diminished, this has improved. No sore throat or ear pain. Chest pain and headache with coughing. Currently on menstrual cycle, no chance of pregnancy. On birth control pills.  Objective:   BP 118/80  Temp(Src) 98.5 F (36.9 C) (Oral)  Ht 5\' 8"  (1.727 m) NAD. Alert, oriented. TMs significant clear effusion, no erythema. Pharynx injected with PND noted. Neck supple with mild soft nontender adenopathy. Lungs mildly diminished breath sounds, faint expiratory wheezes heard throughout lung fields. Given albuterol 2.5 mg nebulizer treatment. Airflow much improved with continued faint expiratory wheezes heard more clearly throughout lung fields. Color normal limit. No tachypnea. Heart regular rate rhythm.  Assessment:Acute upper respiratory infections of unspecified site - Plan: albuterol (PROVENTIL) (2.5 MG/3ML) 0.083% nebulizer solution 2.5 mg  Acute bronchitis  Asthma with acute exacerbation  Plan: Meds ordered this encounter  Medications  . predniSONE (DELTASONE) 20 MG tablet    Sig: 3 po qd x 3 d then 2 po qd x 3 d then 1 po qd x 3 d    Dispense:  18 tablet    Refill:  0    Order Specific Question:  Supervising Provider    Answer:  Merlyn AlbertLUKING, WILLIAM S [[id number]]  . DISCONTD: albuterol (PROVENTIL HFA;VENTOLIN HFA) 108 (90 BASE) MCG/ACT inhaler    Sig: Inhale 2 puffs into the lungs every 4 (four) hours as needed for wheezing or shortness of breath.    Dispense:  1 Inhaler    Refill:  5    Order Specific Question:  Supervising Provider    Answer:  Merlyn AlbertLUKING, WILLIAM S [[id number]]  . albuterol (PROVENTIL) (2.5 MG/3ML) 0.083% nebulizer solution    Sig: Use via neb q 4 hrs prn wheezing   Dispense:  50 vial    Refill:  5    Order Specific Question:  Supervising Provider    Answer:  Merlyn AlbertLUKING, WILLIAM S [[id number]]  . levofloxacin (LEVAQUIN) 500 MG tablet    Sig: Take 1 tablet (500 mg total) by mouth daily.    Dispense:  10 tablet    Refill:  0    Order Specific Question:  Supervising Provider    Answer:  Merlyn AlbertLUKING, WILLIAM S [[id number]]  . albuterol (PROVENTIL) (2.5 MG/3ML) 0.083% nebulizer solution 2.5 mg    Sig:    Recheck in 72 hours if no improvement, go to ED over the weekend if worse. Continue OTC meds as directed for congestion.

## 2013-05-19 ENCOUNTER — Telehealth (HOSPITAL_COMMUNITY): Payer: Self-pay | Admitting: Dietician

## 2013-05-19 NOTE — Telephone Encounter (Addendum)
Received message left on 05/18/13 at 05/18/13 at 1357. Desires Am appointment.

## 2013-05-19 NOTE — Telephone Encounter (Signed)
Called back at 1333. Left message. Offered appointment on 06/18/13.

## 2013-05-19 NOTE — Telephone Encounter (Signed)
Called back on 05/18/13 at 1425. Confirmed 06/18/13 appointment at 0900.

## 2013-05-19 NOTE — Telephone Encounter (Signed)
Received message left on 05/18/13 at 1113. Pt desires appointment. Noted pt is a [employer] on [employer]. She desires appointment on 05/28/13 in the afternoon or 06/18/13.

## 2013-06-07 ENCOUNTER — Telehealth: Payer: Self-pay

## 2013-06-07 ENCOUNTER — Other Ambulatory Visit: Payer: Self-pay | Admitting: Nurse Practitioner

## 2013-06-07 MED ORDER — FUROSEMIDE 80 MG PO TABS: 80.0000 mg | ORAL_TABLET | Freq: Every day | ORAL | Status: DC

## 2013-06-07 NOTE — Telephone Encounter (Signed)
[facility]

## 2013-06-18 ENCOUNTER — Encounter (HOSPITAL_COMMUNITY): Payer: Self-pay | Admitting: Dietician

## 2013-06-18 NOTE — Progress Notes (Signed)
Outpatient Initial Nutrition Assessment  Date:06/18/2013   Appt Start Time: 0852  Referring Physician: Wellness Reason for Visit: obesity  Nutrition Assessment:  Height: '5\' 8"'  (172.7 cm)   Weight: 264 lb (119.75 kg)   IBW: 140#  %IBW: 189# UBW: 260#  %UBW: 102$ Body mass index is 40.15 kg/(m^2).  Goal Weight: 238# (10% loss of current wt) Weight hx: Wt Readings from Last 10 Encounters:  06/18/13 264 lb (119.75 kg)  11/16/10 265 lb (120.203 kg)  11/16/10 265 lb (120.203 kg)  11/09/10 265 lb (120.203 kg)   Reports pregregnancy wt of 215# 5 years ago. She reports unable to lose weight after 2 back to back pregnancies.  Estimated nutritional needs:  Kcals/ day: 1900-2000 Protein (grams)/day: 96-120 Fluid (L)/ day: 1.9-2.0  PMH:  Past Medical History  Diagnosis Date  . PONV (postoperative nausea and vomiting)   . Dysrhythmia     history of svt-controlled on cardizem-diagnosed in 1999-  . Asthma     albuterol inhaler prn-recent uri-using more freq  . Recurrent upper respiratory infection (URI)     resolving  . VSD (ventricular septal defect)     history of -no problems    Medications:  Current Outpatient Rx  Name  Route  Sig  Dispense  Refill  . albuterol (PROVENTIL HFA;VENTOLIN HFA) 108 (90 BASE) MCG/ACT inhaler   Inhalation   Inhale 2 puffs into the lungs every 4 (four) hours as needed for wheezing or shortness of breath.   1 Inhaler   5   . albuterol (PROVENTIL) (2.5 MG/3ML) 0.083% nebulizer solution      Use via neb q 4 hrs prn wheezing   50 vial   5   . azithromycin (ZITHROMAX Z-PAK) 250 MG tablet      Take 2 tablets (500 mg) on  Day 1,  followed by 1 tablet (250 mg) once daily on Days 2 through 5.   6 each   0   . diltiazem (CARDIZEM CD) 240 MG 24 hr capsule   Oral   Take 240 mg by mouth daily.           . Fluticasone-Salmeterol (ADVAIR DISKUS) 250-50 MCG/DOSE AEPB   Inhalation   Inhale 1 puff into the lungs 2 (two) times daily.           .  furosemide (LASIX) 80 MG tablet   Oral   Take 1 tablet (80 mg total) by mouth daily.   90 tablet   1   . levofloxacin (LEVAQUIN) 500 MG tablet   Oral   Take 1 tablet (500 mg total) by mouth daily.   10 tablet   0   . metFORMIN (GLUCOPHAGE-XR) 500 MG 24 hr tablet   Oral   Take 2 tablets (1,000 mg total) by mouth daily with breakfast.   180 tablet   1   . norethindrone-ethinyl estradiol-iron (MICROGESTIN FE,GILDESS FE,LOESTRIN FE) 1.5-30 MG-MCG tablet   Oral   Take 1 tablet by mouth daily.   1 Package   0   . predniSONE (DELTASONE) 20 MG tablet      3 po qd x 3 d then 2 po qd x 3 d then 1 po qd x 3 d   18 tablet   0   . Prenatal Vit-Fe Psac Cmplx-FA (PRENATAL MULTIVITAMIN) 60-1 MG tablet   Oral   Take 1 tablet by mouth daily.           Marland Kitchen triamcinolone (KENALOG) 0.1 % paste  Mouth/Throat   Use as directed 1 application in the mouth or throat 2 (two) times daily. prn   5 g   0     Please deliver to [facility]. Than ...   . Vitamin D, Ergocalciferol, (DRISDOL) 50000 UNITS CAPS capsule   Oral   Take 50,000 Units by mouth every 7 (seven) days.           Labs: CMP     Component Value Date/Time   NA 132* 09/15/2008 1921   K 3.7 09/15/2008 1921   CL 105 09/15/2008 1921   CO2 23 09/15/2008 1921   GLUCOSE 120* 09/15/2008 1921   BUN 7 09/15/2008 1921   CREATININE 0.46 09/15/2008 1921   CALCIUM 8.3* 09/15/2008 1921   PROT 5.2* 09/15/2008 1921   ALBUMIN 2.1* 09/15/2008 1921   AST 25 09/15/2008 1921   ALT 19 09/15/2008 1921   ALKPHOS 171* 09/15/2008 1921   BILITOT 0.4 09/15/2008 1921   GFRNONAA >60 09/15/2008 1921   GFRAA  Value: >60        The eGFR has been calculated using the MDRD equation. This calculation has not been validated in all clinical situations. eGFR's persistently <60 mL/min signify possible Chronic Kidney Disease. 09/15/2008 1921    Lipid Panel  No results found for this basename: chol, trig, hdl, cholhdl, vldl, ldlcalc     No results found  for this basename: HGBA1C   Lab Results  Component Value Date   CREATININE 0.46 09/15/2008     Lifestyle/ social habits: Christina Rodriguez resides in [place] with her husband and 2 children (ages 3 and 68). Occupation Therapist, sports at [employer].  Pt reports stress level as 3-4 out of 10 citing "life" as main sources of stress. Physical activity is inconsistent. Outside of job and caring for her children, she does not engage in exercise.   Nutrition hx/habits: Christina Rodriguez desires weight loss she reports. She would like to get down to her prepregnancy weight (215#). She reports it has been difficult for her to get the weight off after having back to back pregnancies. Due to her busy schedule, meals often consist of convenience food items. She reports dinners are also hectic, due to varying end times to her job. She skips dinner once per week.  She chooses mainly healthy proteins, but admits to having weaknesses for pasta, bacon, and cheese. She eats out about once per week and splits a plate with her kids. Meal consists usually of chicken and baked potato.  She has made an effort to cut back on high calorie soft drinks. She drinks mainly water. She desires to cut out soda completely. She has good support from her husband, who is also trying to commit to a healthier lifestyle.    Diet recall: Breakfast: egg biscuit from [employer] and water; Lunch: Hot Pocket, Easy Mac OR Oodles of Noodles; Dinner: Berniece Salines and Eggs, chicken OR soup. Beverages mainly consist of water and diet soda. Occasional Starbucks Frappuchino.   Nutrition Diagnosis:  Nutrition Intervention: Nutrition rx: 1500 Kcal NAS, no added suagr diet; 3 meals per day; low calorie beverages only; Physical activity as tolerated  Education/Counseling Provided: Educated pt on principles of weight management. Discussed principles of energy expenditure and how changes in diet and physical activity affect weight status. Discussed nutritional content of  commonly eaten foods and suggested healthier alternatives. Educated pt on plate method and a general, healthful diet that includes low fat dairy, lean meats, whole fruits and vegetables, and whole grains  most often. Discussed importance of a healthy diet along with regular physical activity (at least 30 minutes 5 times per week) to achieve weight loss goals. Encouraged slow, moderate weight loss (0.5-2# weight loss per week) and adopting healthy lifestyle changes vs. obtaining a certain body type or weight. Encouraged weighing self weekly at a consistent day and time of choice. Showed pt functionality of MyFitnessPal and encouraged using a food diary to better track caloric intake. Used TeachBack to assess understanding.   Understanding, Motivation, Ability to Follow Recommendations: Expect fair to good compliance.   Monitoring and Evaluation: Goals: 1) 0.2-2# wt loss per week; 2) Physical activity as tolerated  Recommendations: 1) Choose healthier option for breakfast and lunch such as granola bars, healthy choice meals, or leftovers; 2) Continue to use crock pot to save time with cooking; 3) Cook extra portions during the weekend for healthy dinners and lunch  F/U: PRN. RD contact information Provided  Christina Rodriguez, RD, LDN 06/18/2013  Appt EndTime: 2010

## 2013-07-19 ENCOUNTER — Ambulatory Visit (INDEPENDENT_AMBULATORY_CARE_PROVIDER_SITE_OTHER): Payer: 59 | Admitting: Nurse Practitioner

## 2013-07-19 ENCOUNTER — Encounter: Payer: Self-pay | Admitting: Nurse Practitioner

## 2013-07-19 VITALS — BP 122/88 | Ht 68.0 in

## 2013-07-19 DIAGNOSIS — J45909 Unspecified asthma, uncomplicated: Secondary | ICD-10-CM | POA: Insufficient documentation

## 2013-07-19 DIAGNOSIS — J209 Acute bronchitis, unspecified: Secondary | ICD-10-CM

## 2013-07-19 DIAGNOSIS — J45901 Unspecified asthma with (acute) exacerbation: Secondary | ICD-10-CM

## 2013-07-19 DIAGNOSIS — J069 Acute upper respiratory infection, unspecified: Secondary | ICD-10-CM

## 2013-07-19 MED ORDER — PREDNISONE 20 MG PO TABS: ORAL_TABLET | ORAL | Status: DC

## 2013-07-19 MED ORDER — METHYLPREDNISOLONE ACETATE 40 MG/ML IJ SUSP
40.0000 mg | Freq: Once | INTRAMUSCULAR | Status: AC
  Administered 2013-07-19: 40 mg via INTRAMUSCULAR

## 2013-07-19 MED ORDER — AZITHROMYCIN 250 MG PO TABS: ORAL_TABLET | ORAL | Status: DC

## 2013-07-19 NOTE — Progress Notes (Signed)
Subjective:  Presents for complaints of a flareup of her asthma that began 2 days ago. Using her albuterol at least every 4 hours. No fever. No sinus headache. Occasional cough. Some postnasal drainage. Producing clear mucus. No sore throat or ear pain. Taking fluids well. Note patient just use her albuterol inhaler right before visit.  Objective:   BP 122/88  Ht 5\' 8"  (1.727 m) NAD. Alert, oriented. TMs clear effusion, no erythema. Pharynx injected with PND noted. Neck supple with mild soft nontender adenopathy. Lungs faint expiratory wheezes noted throughout lung fields. Crackles with occasional sonorous rhonchi noted on expiration. No tachypnea. Normal color. Heart regular rate rhythm.  Assessment:Acute upper respiratory infections of unspecified site - Plan: methylPREDNISolone acetate (DEPO-MEDROL) injection 40 mg  Acute bronchitis - Plan: methylPREDNISolone acetate (DEPO-MEDROL) injection 40 mg  Asthma with acute exacerbation - Plan: methylPREDNISolone acetate (DEPO-MEDROL) injection 40 mg  Plan: Meds ordered this encounter  Medications  . methylPREDNISolone acetate (DEPO-MEDROL) injection 40 mg    Sig:   . azithromycin (ZITHROMAX Z-PAK) 250 MG tablet    Sig: Take 2 tablets (500 mg) on  Day 1,  followed by 1 tablet (250 mg) once daily on Days 2 through 5.    Dispense:  6 each    Refill:  0    Order Specific Question:  Supervising Provider    Answer:  Merlyn AlbertLUKING, WILLIAM S [[id number]]  . predniSONE (DELTASONE) 20 MG tablet    Sig: 3 po qd x 3 d then 2 po qd x 3 d then 1 po qd x 3 d    Dispense:  18 tablet    Refill:  0    Order Specific Question:  Supervising Provider    Answer:  Merlyn AlbertLUKING, WILLIAM S [[id number]]   recheck in 48 hours if no improvement, sooner if worse.

## 2013-08-13 ENCOUNTER — Telehealth: Payer: Self-pay

## 2013-08-13 NOTE — Telephone Encounter (Signed)
Patient would like a 90 day supply of Pantoprazole sent to [facility].  

## 2013-08-16 ENCOUNTER — Other Ambulatory Visit: Payer: Self-pay | Admitting: *Deleted

## 2013-08-16 MED ORDER — PANTOPRAZOLE SODIUM 40 MG PO TBEC: 40.0000 mg | DELAYED_RELEASE_TABLET | Freq: Every day | ORAL | Status: DC

## 2013-09-02 ENCOUNTER — Ambulatory Visit: Payer: 59 | Admitting: Nurse Practitioner

## 2013-09-03 ENCOUNTER — Ambulatory Visit (INDEPENDENT_AMBULATORY_CARE_PROVIDER_SITE_OTHER): Payer: 59 | Admitting: Nurse Practitioner

## 2013-09-03 DIAGNOSIS — G43109 Migraine with aura, not intractable, without status migrainosus: Secondary | ICD-10-CM

## 2013-09-03 MED ORDER — TOPIRAMATE 25 MG PO TABS: ORAL_TABLET | ORAL | Status: DC

## 2013-09-06 ENCOUNTER — Encounter: Payer: Self-pay | Admitting: Nurse Practitioner

## 2013-09-06 MED ORDER — RIZATRIPTAN BENZOATE 10 MG PO TBDP: 10.0000 mg | ORAL_TABLET | ORAL | Status: DC | PRN

## 2013-09-06 NOTE — Progress Notes (Signed)
Subjective:  Presents for c/o exacerbation of her migraine headaches. Has an aura with headaches, usually "flashing lights" but can be other. No numbness, weakness of face, arms or legs. Exacerbations began last year, progressively worse. 10-15 headaches per month. Nausea, rare vomiting. No change in symptomatology. Has not identified any specific triggers. Not specifically associated with menses. On ocs, some breakthrough spotting.  Objective:   There were no vitals taken for this visit. NAD. Alert, oriented. Lungs clear. Heart RRR.   Assessment:  Problem List Items Addressed This Visit     Cardiovascular and Mediastinum   Migraine headache with aura - Primary   Relevant Medications      topiramate (TOPAMAX) tablet      rizatriptan (MAXALT MLT) disintegrating tablet     Plan:  Meds ordered this encounter  Medications  . topiramate (TOPAMAX) 25 MG tablet    Sig: One po q hs x 7 d then one po BID x 7 d then one po q AM and 2 po q PM    Dispense:  42 tablet    Refill:  0    Order Specific Question:  Supervising Provider    Answer:  Merlyn AlbertLUKING, WILLIAM S [[id number]]  . rizatriptan (MAXALT-MLT) 10 MG disintegrating tablet    Sig: Take 1 tablet (10 mg total) by mouth as needed for migraine. May repeat in 2 hours if needed; max 2 per 24 hours    Dispense:  27 tablet    Refill:  1    Order Specific Question:  Supervising Provider    Answer:  Merlyn AlbertLUKING, WILLIAM S [[id number]]  recommend patient keep a headache diary and bring to next visit. Warning signs reviewed. Recheck sooner if any problems. Recheck in 3 weeks. Patient advised of risks associated with pregnancy and Topamax use.

## 2013-09-16 ENCOUNTER — Other Ambulatory Visit: Payer: Self-pay | Admitting: Nurse Practitioner

## 2013-09-16 ENCOUNTER — Telehealth: Payer: Self-pay | Admitting: *Deleted

## 2013-09-16 MED ORDER — AZITHROMYCIN 250 MG PO TABS: ORAL_TABLET | ORAL | Status: DC

## 2013-09-16 NOTE — Telephone Encounter (Signed)
Will send in to [facility] so she can get it before she leaves

## 2013-09-16 NOTE — Telephone Encounter (Signed)
Leaving on vacation. Can a zpack be called into [facility] just in case she needs it while on vacation.

## 2013-09-16 NOTE — Telephone Encounter (Signed)
Yes. Will send in rx.

## 2013-09-17 ENCOUNTER — Encounter: Payer: Self-pay | Admitting: Family Medicine

## 2013-09-17 ENCOUNTER — Ambulatory Visit (INDEPENDENT_AMBULATORY_CARE_PROVIDER_SITE_OTHER): Payer: 59 | Admitting: Family Medicine

## 2013-09-17 VITALS — BP 116/81 | Ht 68.0 in | Wt 245.0 lb

## 2013-09-17 DIAGNOSIS — M722 Plantar fascial fibromatosis: Secondary | ICD-10-CM

## 2013-09-17 NOTE — Progress Notes (Signed)
Patient ID: Christina Rodriguez, female   DOB: 1975/07/10, 38 y.o.   MRN: [id number] 38 year old female presents with plantar fasciitis symptoms for approximately one year. She's had 2 injections done by her primary care doctor both giving her some symptomatic relief for several months however, the symptoms returned. She's taken anti-inflammatory and apply heat to the area for symptomatic relief. She also notices when she was on a short course of prednisone her symptoms resolved somewhat. Has not been a significant amount of icing her morning night splint in the past. Has not been a significant amount stretching.  Pertinent past medical history: Migraine headaches, asthma  Social history: Nonsmoker, no alcohol  Review of systems as per history of present illness otherwise all systems negative  Examination: BP 116/81  Ht 5\' 8"  (1.727 m)  Wt 245 lb (111.131 kg)  BMI 37.26 kg/m2 Well-developed well-nourished 38 year old female awake alert oriented no acute distress Atraumatic normocephalic Extraocular muscles intact Normal respiratory effort  Feet: Arch collapse with standing with splaying of the toes Tenderness to palpation of the medial calcaneal tuberosity of the right foot Negative calcaneal squeeze Intact Thompson test Neurovascularly intact with equal pulses  Gait: Excessive pronation with ambulation To many toes sign standing greater on the right  Patient was fitted for a : standard, cushioned, semi-rigid orthotic. The orthotic was heated and afterward the patient stood on the orthotic blank positioned on the orthotic stand. The patient was positioned in subtalar neutral position and 10 degrees of ankle dorsiflexion in a weight bearing stance. After completion of molding, a stable base was applied to the orthotic blank. The blank was ground to a stable position for weight bearing. Size: 8 Base: blue eva Posting: none Additional orthotic padding: none  The patient did not have any  normal running or walking shoes with her. She had flip-flops in her work clogs.  We made the orthotics today. She'll put them in her shoes at home and she will return later today if there are any issues or areas of discomfort.  Greater than 40 minutes of time was spent obtaining history from the patient discussing treatment options doing physical examination and making custom orthotics as well as evaluating gait.

## 2013-09-17 NOTE — Assessment & Plan Note (Signed)
Patient was placed in formal custom orthotics today. I've also given her a prescription for a night splint to wear either the evenings or at night if she tolerates it well sleeping. She's given a plantar fashion strap to wear. I advised stretching as well as rolling her foot on a tennis ball and rolling her foot on an iced water bottle. She'll try all these measures if her symptoms do not slowly start to improve over the next 6 weeks she'll return and we'll discuss further options. Advised her that a third corticosteroid injection is ill-advised at this time. I believe orthotics will help alleviate some of her symptoms.  As she did not have appropriate foot wear with her today she will return and we'll make any needed adjustments as needed.

## 2013-12-30 ENCOUNTER — Other Ambulatory Visit: Payer: Self-pay | Admitting: Obstetrics and Gynecology

## 2013-12-31 ENCOUNTER — Other Ambulatory Visit: Payer: Self-pay | Admitting: Nurse Practitioner

## 2013-12-31 DIAGNOSIS — R5383 Other fatigue: Secondary | ICD-10-CM

## 2013-12-31 DIAGNOSIS — Z0189 Encounter for other specified special examinations: Secondary | ICD-10-CM

## 2013-12-31 DIAGNOSIS — M2550 Pain in unspecified joint: Secondary | ICD-10-CM

## 2013-12-31 DIAGNOSIS — Z13220 Encounter for screening for lipoid disorders: Secondary | ICD-10-CM

## 2013-12-31 DIAGNOSIS — D509 Iron deficiency anemia, unspecified: Secondary | ICD-10-CM

## 2013-12-31 DIAGNOSIS — R5381 Other malaise: Secondary | ICD-10-CM

## 2013-12-31 LAB — CYTOLOGY - PAP

## 2014-01-07 ENCOUNTER — Encounter (INDEPENDENT_AMBULATORY_CARE_PROVIDER_SITE_OTHER): Payer: Self-pay

## 2014-01-21 LAB — LIPID PANEL
Cholesterol: 139 mg/dL (ref 0–200)
HDL: 30 mg/dL — ABNORMAL LOW (ref >39)
LDL Cholesterol: 87 mg/dL (ref 0–99)
Total CHOL/HDL Ratio: 4.6 Ratio
Triglycerides: 108 mg/dL (ref <150)
VLDL: 22 mg/dL (ref 0–40)

## 2014-01-21 LAB — BASIC METABOLIC PANEL
BUN: 16 mg/dL (ref 6–23)
CALCIUM: 9.2 mg/dL (ref 8.4–10.5)
CO2: 25 meq/L (ref 19–32)
Chloride: 104 mEq/L (ref 96–112)
Creat: 0.62 mg/dL (ref 0.50–1.10)
GLUCOSE: 77 mg/dL (ref 70–99)
Potassium: 4.2 mEq/L (ref 3.5–5.3)
SODIUM: 137 meq/L (ref 135–145)

## 2014-01-21 LAB — TSH: TSH: 2.877 u[IU]/mL (ref 0.350–4.500)

## 2014-01-21 LAB — SEDIMENTATION RATE: Sed Rate: 34 mm/hr — ABNORMAL HIGH (ref 0–22)

## 2014-01-21 LAB — HEPATIC FUNCTION PANEL
ALT: 13 U/L (ref 0–35)
AST: 13 U/L (ref 0–37)
Albumin: 4.1 g/dL (ref 3.5–5.2)
Alkaline Phosphatase: 74 U/L (ref 39–117)
BILIRUBIN DIRECT: 0.1 mg/dL (ref 0.0–0.3)
BILIRUBIN INDIRECT: 0.2 mg/dL (ref 0.2–1.2)
Total Bilirubin: 0.3 mg/dL (ref 0.2–1.2)
Total Protein: 7.1 g/dL (ref 6.0–8.3)

## 2014-01-21 LAB — VITAMIN D 25 HYDROXY (VIT D DEFICIENCY, FRACTURES): Vit D, 25-Hydroxy: 25 ng/mL — ABNORMAL LOW (ref 30–89)

## 2014-01-21 LAB — FERRITIN: Ferritin: 16 ng/mL (ref 10–291)

## 2014-01-24 ENCOUNTER — Encounter: Payer: Self-pay | Admitting: Nurse Practitioner

## 2014-01-24 ENCOUNTER — Ambulatory Visit (INDEPENDENT_AMBULATORY_CARE_PROVIDER_SITE_OTHER): Payer: 59 | Admitting: Nurse Practitioner

## 2014-01-24 DIAGNOSIS — D509 Iron deficiency anemia, unspecified: Secondary | ICD-10-CM

## 2014-01-24 DIAGNOSIS — E559 Vitamin D deficiency, unspecified: Secondary | ICD-10-CM

## 2014-01-24 DIAGNOSIS — R700 Elevated erythrocyte sedimentation rate: Secondary | ICD-10-CM

## 2014-01-24 NOTE — Progress Notes (Signed)
Subjective:  Presents to discuss labs requested by GYN. Followed for heavy menses. Also sed rate was very elevated at last check. Taking vitamin D. Wanted labs done through [facility] as employee due to costs.  Objective:   See labs dated 01/20/14. Reviewed results with patient. Sed rate has improved. HDL slightly low.   Assessment: Elevated sed rate  Iron deficiency anemia  Vitamin D insufficiency  Plan: given a copy of labs for GYN. Continue vitamin D. Recommend regular exercise. Recheck here as needed.

## 2014-02-16 ENCOUNTER — Other Ambulatory Visit: Payer: Self-pay | Admitting: Nurse Practitioner

## 2014-02-16 MED ORDER — ANTIPYRINE-BENZOCAINE 5.4-1.4 % OT SOLN: OTIC | Status: DC

## 2014-02-16 NOTE — Progress Notes (Signed)
Phone call from patient. C/o severe ear pain; throbbing; kept her up last night. Is supposed to go out of town today. Sinus pressure for the past few weeks. Sinus headache. No fever. Will call in antibiotic. Office visit if no better.

## 2014-02-18 ENCOUNTER — Other Ambulatory Visit: Payer: Self-pay

## 2014-02-28 ENCOUNTER — Other Ambulatory Visit: Payer: Self-pay | Admitting: *Deleted

## 2014-02-28 MED ORDER — FLUCONAZOLE 150 MG PO TABS: 150.0000 mg | ORAL_TABLET | Freq: Once | ORAL | Status: DC

## 2014-02-28 MED ORDER — FLUCONAZOLE 150 MG PO TABS: ORAL_TABLET | ORAL | Status: DC

## 2014-03-02 ENCOUNTER — Other Ambulatory Visit: Payer: Self-pay | Admitting: Nurse Practitioner

## 2014-03-02 MED ORDER — TERCONAZOLE 0.4 % VA CREA: 1.0000 | TOPICAL_CREAM | Freq: Every day | VAGINAL | Status: DC

## 2014-03-04 ENCOUNTER — Other Ambulatory Visit: Payer: Self-pay | Admitting: *Deleted

## 2014-03-04 MED ORDER — FLUCONAZOLE 150 MG PO TABS: ORAL_TABLET | ORAL | Status: DC

## 2014-03-07 ENCOUNTER — Encounter: Payer: Self-pay | Admitting: Nurse Practitioner

## 2014-04-06 ENCOUNTER — Other Ambulatory Visit: Payer: Self-pay | Admitting: Nurse Practitioner

## 2014-04-06 MED ORDER — DOXYCYCLINE HYCLATE 100 MG PO TABS: 100.0000 mg | ORAL_TABLET | Freq: Two times a day (BID) | ORAL | Status: DC

## 2014-04-06 NOTE — Progress Notes (Signed)
C/o pain on outer ear yesterday; worse today. Mild erythema and small papule noted.

## 2014-07-12 ENCOUNTER — Other Ambulatory Visit: Payer: Self-pay

## 2014-07-12 MED ORDER — HYDROCODONE-HOMATROPINE 5-1.5 MG/5ML PO SYRP: 5.0000 mL | ORAL_SOLUTION | Freq: Four times a day (QID) | ORAL | Status: DC | PRN

## 2014-09-07 ENCOUNTER — Other Ambulatory Visit: Payer: Self-pay | Admitting: Nurse Practitioner

## 2014-09-07 MED ORDER — NORETHINDRONE 0.35 MG PO TABS: 1.0000 | ORAL_TABLET | Freq: Every day | ORAL | Status: DC

## 2014-10-13 ENCOUNTER — Other Ambulatory Visit: Payer: Self-pay | Admitting: Nurse Practitioner

## 2014-10-13 MED ORDER — FLUCONAZOLE 150 MG PO TABS
ORAL_TABLET | ORAL | Status: DC
Start: 2014-10-13 — End: 2014-10-13

## 2014-10-13 MED ORDER — ONDANSETRON 8 MG PO TBDP: 8.0000 mg | ORAL_TABLET | Freq: Three times a day (TID) | ORAL | Status: DC | PRN

## 2014-10-13 MED ORDER — AZITHROMYCIN 250 MG PO TABS: ORAL_TABLET | ORAL | Status: DC

## 2014-10-13 MED ORDER — FLUCONAZOLE 150 MG PO TABS: ORAL_TABLET | ORAL | Status: DC

## 2014-11-15 ENCOUNTER — Other Ambulatory Visit: Payer: Self-pay | Admitting: Nurse Practitioner

## 2014-11-15 ENCOUNTER — Telehealth: Payer: Self-pay | Admitting: *Deleted

## 2014-11-15 MED ORDER — DILTIAZEM HCL ER COATED BEADS 240 MG PO CP24: 240.0000 mg | ORAL_CAPSULE | Freq: Every day | ORAL | Status: DC

## 2014-11-15 MED ORDER — METFORMIN HCL ER 500 MG PO TB24: 1000.0000 mg | ORAL_TABLET | Freq: Every day | ORAL | Status: DC

## 2014-11-15 MED ORDER — FUROSEMIDE 80 MG PO TABS: 80.0000 mg | ORAL_TABLET | Freq: Every day | ORAL | Status: DC

## 2014-11-15 NOTE — Telephone Encounter (Signed)
Pt.notified

## 2014-11-15 NOTE — Telephone Encounter (Signed)
Requesting refill on cardizem, lasix, and metformin. [facility]

## 2014-11-15 NOTE — Telephone Encounter (Signed)
Done. Office visit this fall.

## 2014-11-16 ENCOUNTER — Encounter (HOSPITAL_COMMUNITY): Payer: Self-pay

## 2014-11-16 ENCOUNTER — Encounter (HOSPITAL_COMMUNITY)
Admission: RE | Admit: 2014-11-16 | Discharge: 2014-11-16 | Disposition: A | Discharge status: Home / Self Care | Payer: 59 | Source: Ambulatory Visit | Attending: Obstetrics and Gynecology | Admitting: Obstetrics and Gynecology

## 2014-11-16 DIAGNOSIS — Z01818 Encounter for other preprocedural examination: Secondary | ICD-10-CM | POA: Diagnosis present

## 2014-11-16 HISTORY — DX: Headache, unspecified: R51.9

## 2014-11-16 HISTORY — DX: Anemia, unspecified: D64.9

## 2014-11-16 HISTORY — DX: Headache: R51

## 2014-11-16 HISTORY — DX: Personal history of other diseases of the female genital tract: Z87.42

## 2014-11-16 HISTORY — DX: Gastro-esophageal reflux disease without esophagitis: K21.9

## 2014-11-16 LAB — BASIC METABOLIC PANEL
ANION GAP: 4 — AB (ref 5–15)
BUN: 12 mg/dL (ref 6–20)
CHLORIDE: 108 mmol/L (ref 101–111)
CO2: 23 mmol/L (ref 22–32)
Calcium: 8.4 mg/dL — ABNORMAL LOW (ref 8.9–10.3)
Creatinine, Ser: 0.74 mg/dL (ref 0.44–1.00)
GFR calc Af Amer: >60 mL/min (ref >60)
Glucose, Bld: 127 mg/dL — ABNORMAL HIGH (ref 65–99)
POTASSIUM: 3.9 mmol/L (ref 3.5–5.1)
Sodium: 135 mmol/L (ref 135–145)

## 2014-11-16 LAB — CBC
HCT: 36.8 % (ref 36.0–46.0)
HEMOGLOBIN: 11.7 g/dL — AB (ref 12.0–15.0)
MCH: 27 pg (ref 26.0–34.0)
MCHC: 31.8 g/dL (ref 30.0–36.0)
MCV: 84.8 fL (ref 78.0–100.0)
PLATELETS: 340 10*3/uL (ref 150–400)
RBC: 4.34 MIL/uL (ref 3.87–5.11)
RDW: 16.1 % — AB (ref 11.5–15.5)
WBC: 8.9 10*3/uL (ref 4.0–10.5)

## 2014-11-16 NOTE — Patient Instructions (Signed)
Your procedure is scheduled on:  December 06, 2014  Enter through the Main Entrance of [facility] at:  10:30 am   Pick up the phone at the desk and dial [phone].  Call this number if you have problems the morning of surgery: [phone].  Remember: Do NOT eat food:  After midnight on 12-05-14   Do NOT drink clear liquids after:  8:00 am day of surgery  Take these medicines the morning of surgery with a SIP OF WATER:  Cardizem CD, and Lasix Bring all inhalers with you day of surgery   Do NOT wear jewelry (body piercing), metal hair clips/bobby pins, make-up, or nail polish. Do NOT wear lotions, powders, or perfumes.  You may wear deoderant. Do NOT shave for 48 hours prior to surgery. Do NOT bring valuables to the hospital. Contacts, dentures, or bridgework may not be worn into surgery. Have a responsible adult drive you home and stay with you for 24 hours after your procedure.

## 2014-11-17 ENCOUNTER — Other Ambulatory Visit (HOSPITAL_COMMUNITY): Payer: Self-pay | Admitting: Obstetrics and Gynecology

## 2014-11-17 DIAGNOSIS — Z1231 Encounter for screening mammogram for malignant neoplasm of breast: Secondary | ICD-10-CM

## 2014-12-05 ENCOUNTER — Other Ambulatory Visit: Payer: Self-pay | Admitting: Obstetrics and Gynecology

## 2014-12-06 ENCOUNTER — Ambulatory Visit (HOSPITAL_COMMUNITY): Payer: 59 | Admitting: Anesthesiology

## 2014-12-06 ENCOUNTER — Ambulatory Visit (HOSPITAL_COMMUNITY)
Admission: RE | Admit: 2014-12-06 | Discharge: 2014-12-06 | Disposition: A | Discharge status: Home / Self Care | Payer: 59 | Source: Ambulatory Visit | Attending: Obstetrics and Gynecology | Admitting: Obstetrics and Gynecology

## 2014-12-06 ENCOUNTER — Encounter (HOSPITAL_COMMUNITY)
Admission: RE | Disposition: A | Discharge status: Home / Self Care | Payer: Self-pay | Source: Ambulatory Visit | Attending: Obstetrics and Gynecology

## 2014-12-06 ENCOUNTER — Encounter (HOSPITAL_COMMUNITY): Payer: Self-pay | Admitting: Anesthesiology

## 2014-12-06 DIAGNOSIS — K219 Gastro-esophageal reflux disease without esophagitis: Secondary | ICD-10-CM | POA: Diagnosis not present

## 2014-12-06 DIAGNOSIS — N840 Polyp of corpus uteri: Secondary | ICD-10-CM | POA: Insufficient documentation

## 2014-12-06 DIAGNOSIS — J45909 Unspecified asthma, uncomplicated: Secondary | ICD-10-CM | POA: Diagnosis not present

## 2014-12-06 DIAGNOSIS — G43909 Migraine, unspecified, not intractable, without status migrainosus: Secondary | ICD-10-CM | POA: Insufficient documentation

## 2014-12-06 DIAGNOSIS — Z79899 Other long term (current) drug therapy: Secondary | ICD-10-CM | POA: Insufficient documentation

## 2014-12-06 DIAGNOSIS — Z888 Allergy status to other drugs, medicaments and biological substances status: Secondary | ICD-10-CM | POA: Insufficient documentation

## 2014-12-06 DIAGNOSIS — Z3202 Encounter for pregnancy test, result negative: Secondary | ICD-10-CM | POA: Diagnosis not present

## 2014-12-06 DIAGNOSIS — Z6841 Body Mass Index (BMI) 40.0 and over, adult: Secondary | ICD-10-CM | POA: Diagnosis not present

## 2014-12-06 DIAGNOSIS — Z91013 Allergy to seafood: Secondary | ICD-10-CM | POA: Diagnosis not present

## 2014-12-06 DIAGNOSIS — N939 Abnormal uterine and vaginal bleeding, unspecified: Secondary | ICD-10-CM | POA: Diagnosis present

## 2014-12-06 DIAGNOSIS — I471 Supraventricular tachycardia: Secondary | ICD-10-CM | POA: Diagnosis not present

## 2014-12-06 DIAGNOSIS — D649 Anemia, unspecified: Secondary | ICD-10-CM | POA: Diagnosis not present

## 2014-12-06 DIAGNOSIS — Z91018 Allergy to other foods: Secondary | ICD-10-CM | POA: Insufficient documentation

## 2014-12-06 HISTORY — PX: HYSTEROSCOPY WITH D & C: SHX1775

## 2014-12-06 LAB — TYPE AND SCREEN
ABO/RH(D): A POS
Antibody Screen: NEGATIVE

## 2014-12-06 LAB — ABO/RH: ABO/RH(D): A POS

## 2014-12-06 LAB — PREGNANCY, URINE: PREG TEST UR: NEGATIVE

## 2014-12-06 SURGERY — DILATATION AND CURETTAGE /HYSTEROSCOPY
Anesthesia: General | Site: Vagina

## 2014-12-06 MED ORDER — LIDOCAINE HCL 1 % IJ SOLN
INTRAMUSCULAR | Status: AC
  Filled 2014-12-06: qty 20

## 2014-12-06 MED ORDER — DEXAMETHASONE SODIUM PHOSPHATE 10 MG/ML IJ SOLN
INTRAMUSCULAR | Status: DC | PRN
  Administered 2014-12-06: 8 mg via INTRAVENOUS

## 2014-12-06 MED ORDER — MEPERIDINE HCL 25 MG/ML IJ SOLN: 6.2500 mg | INTRAMUSCULAR | Status: DC | PRN

## 2014-12-06 MED ORDER — DEXAMETHASONE SODIUM PHOSPHATE 10 MG/ML IJ SOLN
INTRAMUSCULAR | Status: AC
Start: 2014-12-06 — End: 2014-12-06
  Filled 2014-12-06: qty 1

## 2014-12-06 MED ORDER — ONDANSETRON HCL 4 MG/2ML IJ SOLN
INTRAMUSCULAR | Status: DC | PRN
  Administered 2014-12-06: 4 mg via INTRAVENOUS

## 2014-12-06 MED ORDER — HYDROMORPHONE HCL 1 MG/ML IJ SOLN: 0.2500 mg | INTRAMUSCULAR | Status: DC | PRN

## 2014-12-06 MED ORDER — FENTANYL CITRATE (PF) 250 MCG/5ML IJ SOLN
INTRAMUSCULAR | Status: AC
  Filled 2014-12-06: qty 25

## 2014-12-06 MED ORDER — SCOPOLAMINE 1 MG/3DAYS TD PT72
MEDICATED_PATCH | TRANSDERMAL | Status: AC
  Administered 2014-12-06: 1.5 mg via TRANSDERMAL
  Filled 2014-12-06: qty 1

## 2014-12-06 MED ORDER — GLYCINE 1.5 % IR SOLN
Status: DC | PRN
  Administered 2014-12-06: 3000 mL

## 2014-12-06 MED ORDER — LIDOCAINE HCL (CARDIAC) 20 MG/ML IV SOLN
INTRAVENOUS | Status: DC | PRN
  Administered 2014-12-06: 80 mg via INTRAVENOUS

## 2014-12-06 MED ORDER — LACTATED RINGERS IV SOLN: INTRAVENOUS | Status: DC

## 2014-12-06 MED ORDER — MIDAZOLAM HCL 2 MG/2ML IJ SOLN
INTRAMUSCULAR | Status: DC | PRN
  Administered 2014-12-06: 2 mg via INTRAVENOUS

## 2014-12-06 MED ORDER — PROPOFOL 10 MG/ML IV BOLUS
INTRAVENOUS | Status: DC | PRN
  Administered 2014-12-06: 200 mg via INTRAVENOUS

## 2014-12-06 MED ORDER — PROPOFOL 10 MG/ML IV BOLUS
INTRAVENOUS | Status: AC
  Filled 2014-12-06: qty 40

## 2014-12-06 MED ORDER — FENTANYL CITRATE (PF) 100 MCG/2ML IJ SOLN
INTRAMUSCULAR | Status: DC | PRN
  Administered 2014-12-06: 25 ug via INTRAVENOUS
  Administered 2014-12-06: 50 ug via INTRAVENOUS
  Administered 2014-12-06: 25 ug via INTRAVENOUS
  Administered 2014-12-06: 100 ug via INTRAVENOUS

## 2014-12-06 MED ORDER — LACTATED RINGERS IV SOLN
INTRAVENOUS | Status: DC
  Administered 2014-12-06 (×2): via INTRAVENOUS

## 2014-12-06 MED ORDER — MIDAZOLAM HCL 2 MG/2ML IJ SOLN
INTRAMUSCULAR | Status: AC
  Filled 2014-12-06: qty 4

## 2014-12-06 MED ORDER — SCOPOLAMINE 1 MG/3DAYS TD PT72
1.0000 | MEDICATED_PATCH | Freq: Once | TRANSDERMAL | Status: DC
  Administered 2014-12-06: 1.5 mg via TRANSDERMAL

## 2014-12-06 MED ORDER — LIDOCAINE HCL (CARDIAC) 20 MG/ML IV SOLN
INTRAVENOUS | Status: AC
  Filled 2014-12-06: qty 5

## 2014-12-06 MED ORDER — METOCLOPRAMIDE HCL 5 MG/ML IJ SOLN
INTRAMUSCULAR | Status: AC
  Filled 2014-12-06: qty 2

## 2014-12-06 MED ORDER — KETOROLAC TROMETHAMINE 30 MG/ML IJ SOLN
INTRAMUSCULAR | Status: AC
  Filled 2014-12-06: qty 1

## 2014-12-06 MED ORDER — METOCLOPRAMIDE HCL 5 MG/ML IJ SOLN
10.0000 mg | Freq: Once | INTRAMUSCULAR | Status: AC | PRN
  Administered 2014-12-06: 10 mg via INTRAVENOUS

## 2014-12-06 MED ORDER — ONDANSETRON HCL 4 MG/2ML IJ SOLN
INTRAMUSCULAR | Status: AC
  Filled 2014-12-06: qty 2

## 2014-12-06 MED ORDER — KETOROLAC TROMETHAMINE 30 MG/ML IJ SOLN
INTRAMUSCULAR | Status: DC | PRN
  Administered 2014-12-06: 30 mg via INTRAVENOUS

## 2014-12-06 MED ORDER — HYDROCODONE-ACETAMINOPHEN 5-325 MG PO TABS: ORAL_TABLET | ORAL | Status: DC

## 2014-12-06 MED ORDER — LIDOCAINE HCL 1 % IJ SOLN
INTRAMUSCULAR | Status: DC | PRN
  Administered 2014-12-06: 10 mL

## 2014-12-06 SURGICAL SUPPLY — 16 items
ABLATOR ENDOMETRIAL BIPOLAR (ABLATOR) IMPLANT
CANISTER SUCT 3000ML (MISCELLANEOUS) ×2 IMPLANT
CATH ROBINSON RED A/P 16FR (CATHETERS) ×2 IMPLANT
CLOTH BEACON ORANGE TIMEOUT ST (SAFETY) ×2 IMPLANT
CONTAINER PREFILL 10% NBF 60ML (FORM) ×4 IMPLANT
DILATOR CANAL MILEX (MISCELLANEOUS) IMPLANT
ELECT REM PT RETURN 9FT ADLT (ELECTROSURGICAL) ×2
ELECTRODE REM PT RTRN 9FT ADLT (ELECTROSURGICAL) IMPLANT
GLOVE BIO SURGEON STRL SZ7 (GLOVE) ×2 IMPLANT
GOWN STRL REUS W/TWL LRG LVL3 (GOWN DISPOSABLE) ×4 IMPLANT
PACK VAGINAL MINOR WOMEN LF (CUSTOM PROCEDURE TRAY) ×2 IMPLANT
PAD OB MATERNITY 4.3X12.25 (PERSONAL CARE ITEMS) ×2 IMPLANT
TOWEL OR 17X24 6PK STRL BLUE (TOWEL DISPOSABLE) ×4 IMPLANT
TUBING AQUILEX INFLOW (TUBING) ×2 IMPLANT
TUBING AQUILEX OUTFLOW (TUBING) ×2 IMPLANT
WATER STERILE IRR 1000ML POUR (IV SOLUTION) ×2 IMPLANT

## 2014-12-06 NOTE — H&P (Signed)
Christina Rodriguez is an 39 y.o. female.   39 yo G2P2 presents for hysteroscopy D&C for abnormal uterine bleeding. Office sonohystogram showed an endocervical polyp. R/B/A of the procedure were discussed and length and patient wishes to procede   No LMP recorded.    Past Medical History  Diagnosis Date  . PONV (postoperative nausea and vomiting)   . Dysrhythmia     history of svt-controlled on cardizem-diagnosed in 1999-  . Asthma     albuterol inhaler prn-recent uri-using more freq  . Recurrent upper respiratory infection (URI)     resolving  . VSD (ventricular septal defect)     history of -no problems  . GERD (gastroesophageal reflux disease)     uses protonix  . History of polycystic ovarian disease   . Headache     migraines   . Anemia     HIstory of     Past Surgical History  Procedure Laterality Date  . Cesarean section  2010  . Cholecystectomy  2003  . Cesarean section  11/16/2010    Procedure: CESAREAN SECTION;  Surgeon: Almon Hercules;  Location: [facility];  Service: Gynecology;  Laterality: N/A;  Repeat   . Uterine polyps    . Dilation and curettage of uterus      History reviewed. No pertinent family history.  Social History:  reports that she has never smoked. She has never used smokeless tobacco. She reports that she does not drink alcohol or use illicit drugs.  Allergies:  Allergies  Allergen Reactions  . Beta Adrenergic Blockers Shortness Of Breath  . Shrimp [Shellfish Allergy] Hives  . Soybeans Hives    Facility-administered medications prior to admission  Medication Dose Route Frequency Provider Last Rate Last Dose  . methylPREDNISolone acetate (DEPO-MEDROL) injection 40 mg  40 mg Intramuscular Once Campbell Riches, NP       Prescriptions prior to admission  Medication Sig Dispense Refill Last Dose  . albuterol (PROVENTIL HFA;VENTOLIN HFA) 108 (90 BASE) MCG/ACT inhaler Inhale 2 puffs into the lungs every 4 (four) hours as needed for wheezing or  shortness of breath. 1 Inhaler 5 rescue  . albuterol (PROVENTIL) (2.5 MG/3ML) 0.083% nebulizer solution Use via neb q 4 hrs prn wheezing 50 vial 5 rescue  . diltiazem (CARDIZEM CD) 240 MG 24 hr capsule Take 1 capsule (240 mg total) by mouth daily. 90 capsule 1 12/06/2014 at 0730  . EPINEPHrine 0.3 mg/0.3 mL IJ SOAJ injection Inject 0.3 mg into the muscle once.   rescue  . Fluticasone-Salmeterol (ADVAIR DISKUS) 250-50 MCG/DOSE AEPB Inhale 1 puff into the lungs 2 (two) times daily.     Taking  . ibuprofen (ADVIL,MOTRIN) 200 MG tablet Take 400 mg by mouth every 6 (six) hours as needed for headache, mild pain or moderate pain.     . metFORMIN (GLUCOPHAGE-XR) 500 MG 24 hr tablet Take 2 tablets (1,000 mg total) by mouth daily with breakfast. 180 tablet 1 12/05/2014 at Unknown time  . Multiple Vitamins-Minerals (MULTIVITAMIN ADULT PO) Take 1 tablet by mouth daily.     . norethindrone (MICRONOR,CAMILA,ERRIN) 0.35 MG tablet Take 1 tablet (0.35 mg total) by mouth daily. 3 Package 3 12/05/2014 at Unknown time  . ondansetron (ZOFRAN-ODT) 8 MG disintegrating tablet Take 1 tablet (8 mg total) by mouth every 8 (eight) hours as needed for nausea or vomiting. 30 tablet 0   . pantoprazole (PROTONIX) 40 MG tablet Take 1 tablet (40 mg total) by mouth daily. (Patient taking differently: Take  40 mg by mouth daily as needed (acid reflux). ) 90 tablet 1 Past Month at Unknown time  . rizatriptan (MAXALT-MLT) 10 MG disintegrating tablet Take 1 tablet (10 mg total) by mouth as needed for migraine. May repeat in 2 hours if needed; max 2 per 24 hours 27 tablet 1   . antipyrine-benzocaine (AURALGAN) otic solution 4 drops in affected ear QID prn pain (Patient not taking: Reported on 11/14/2014) 10 mL 0 Completed Course at Unknown time  . azithromycin (ZITHROMAX Z-PAK) 250 MG tablet Take 2 tablets (500 mg) on  Day 1,  followed by 1 tablet (250 mg) once daily on Days 2 through 5. (Patient not taking: Reported on 11/14/2014) 6 each 0 Completed  Course at Unknown time  . doxycycline (VIBRA-TABS) 100 MG tablet Take 1 tablet (100 mg total) by mouth 2 (two) times daily. (Patient not taking: Reported on 11/14/2014) 20 tablet 0 Completed Course at Unknown time  . fluconazole (DIFLUCAN) 150 MG tablet One po qd prn yeast infection; may repeat in 3-4 days if needed (Patient not taking: Reported on 11/14/2014) 2 tablet 0 Completed Course at Unknown time  . furosemide (LASIX) 80 MG tablet Take 1 tablet (80 mg total) by mouth daily. 90 tablet 1   . HYDROcodone-homatropine (HYCODAN) 5-1.5 MG/5ML syrup Take 5 mLs by mouth every 6 (six) hours as needed for cough. (Patient not taking: Reported on 11/14/2014) 90 mL 0 Completed Course at Unknown time  . predniSONE (DELTASONE) 20 MG tablet 3 po qd x 3 d then 2 po qd x 3 d then 1 po qd x 3 d (Patient not taking: Reported on 11/14/2014) 18 tablet 0 Completed Course at Unknown time  . terconazole (TERAZOL 7) 0.4 % vaginal cream Place 1 applicator vaginally at bedtime. (Patient not taking: Reported on 11/14/2014) 45 g 0 Completed Course at Unknown time  . topiramate (TOPAMAX) 25 MG tablet One po q hs x 7 d then one po BID x 7 d then one po q AM and 2 po q PM (Patient not taking: Reported on 11/14/2014) 42 tablet 0 Not Taking at Unknown time  . triamcinolone (KENALOG) 0.1 % paste Use as directed 1 application in the mouth or throat 2 (two) times daily. prn (Patient not taking: Reported on 11/14/2014) 5 g 0 Not Taking at Unknown time    ROS  Blood pressure 134/91, pulse 92, temperature 98.6 F (37 C), temperature source Oral, resp. rate 18, SpO2 100 %. Physical Exam   AOX3, NAD Abd soft Breathing without difficulty  Results for orders placed or performed during the hospital encounter of 12/06/14 (from the past 24 hour(s))  Pregnancy, urine     Status: None   Collection Time: 12/06/14 11:15 AM  Result Value Ref Range   Preg Test, Ur NEGATIVE NEGATIVE    No results found.  Assessment/Plan: 1) Hysteroscopy  D&C  Christina Rodriguez H. 12/06/2014, 11:37 AM

## 2014-12-06 NOTE — Anesthesia Postprocedure Evaluation (Signed)
  Anesthesia Post-op Note  Patient: Christina Rodriguez  Procedure(s) Performed: Procedure(s): DILATATION AND CURETTAGE /HYSTEROSCOPY (N/A)  Patient is awake and responsive. Pain and nausea are reasonably well controlled. Vital signs are stable and clinically acceptable. Oxygen saturation is clinically acceptable. There are no apparent anesthetic complications at this time. Patient is ready for discharge.

## 2014-12-06 NOTE — Discharge Instructions (Signed)

## 2014-12-06 NOTE — Anesthesia Preprocedure Evaluation (Addendum)
Anesthesia Evaluation  Patient identified by MRN, date of birth, ID band Patient awake    Reviewed: Allergy & Precautions, NPO status , Patient's Chart, lab work & pertinent test results, reviewed documented beta blocker date and time   History of Anesthesia Complications (+) PONV and history of anesthetic complications  Airway Mallampati: II  TM Distance: >3 FB Neck ROM: Full    Dental no notable dental hx. (+) Teeth Intact   Pulmonary asthma , Recent URI , Resolved,  breath sounds clear to auscultation  Pulmonary exam normal       Cardiovascular + dysrhythmias Supra Ventricular Tachycardia Rhythm:Regular Rate:Normal  SVT controlled with Diltiazem Hx/o VSD   Neuro/Psych  Headaches, negative psych ROS   GI/Hepatic Neg liver ROS, GERD-  Medicated and Controlled,  Endo/Other  Morbid obesityPCOS  Renal/GU   negative genitourinary   Musculoskeletal negative musculoskeletal ROS (+)   Abdominal (+) + obese,   Peds  Hematology  (+) anemia ,   Anesthesia Other Findings   Reproductive/Obstetrics                            Anesthesia Physical Anesthesia Plan  ASA: III  Anesthesia Plan: General   Post-op Pain Management:    Induction: Intravenous  Airway Management Planned: LMA  Additional Equipment:   Intra-op Plan:   Post-operative Plan: Extubation in OR  Informed Consent: I have reviewed the patients History and Physical, chart, labs and discussed the procedure including the risks, benefits and alternatives for the proposed anesthesia with the patient or authorized representative who has indicated his/her understanding and acceptance.   Dental advisory given  Plan Discussed with: CRNA, Anesthesiologist and Surgeon  Anesthesia Plan Comments:       Anesthesia Quick Evaluation

## 2014-12-06 NOTE — Transfer of Care (Addendum)
Immediate Anesthesia Transfer of Care Note  Patient: Christina Rodriguez  Procedure(s) Performed: Procedure(s): DILATATION AND CURETTAGE /HYSTEROSCOPY (N/A)  Patient Location: PACU  Anesthesia Type:General  Level of Consciousness: drowsy but arouses to touch and name  Airway & Oxygen Therapy: Patient Spontanous Breathing and Patient connected to nasal cannula oxygen  Post-op Assessment: Report given to RN and Post -op Vital signs reviewed and stable  Post vital signs: Reviewed and stable R 20 SaO2 100% VSS EKG NSR with occasional unifocal wide complex ventricular beats  Last Vitals:  Filed Vitals:   12/06/14 1255  BP:   Pulse: 93  Temp: 36.6 C  Resp:     Complications: No apparent anesthesia complications

## 2014-12-06 NOTE — Op Note (Signed)
Pre-Operative Diagnosis:  Abnormal uterine bleeding Postoperative Diagnosis: Same Procedure: Hysteroscopy, dilation and curettage, endocervical polypectomy Surgeon: Dr. Waynard Reeds Assistant: None Operative Findings: Anteverted uterus, normal appearing intrauterine cavity. Bilateral ostia visualized. Endocervical polyp noted. Fluid deficit 350 cc, however a significant volume of fluid was spilled on the floor Specimen: Endometrial and endocervical curettings EBL: Total I/O In: 500 [I.V.:500] Out: -    Christina Rodriguez Is a 39 year old gravida 2 para 2002 who presents for definitive surgical management for abnormal uterine bleeding. Please see the patient's history and physical for complete details of the history. Management options were discussed with the patient. R/B/A reviewed. Following appropriate informed consent was taken to the operating room. The patient was appropriately identified during a time out procedure. General anesthesia was administered and the patient was placed in the dorsal lithotomy position. The patient was prepped and draped in the normal sterile fashion. A speculum was placed into the vagina, a single-tooth tenaculum was placed on the anterior lip of the cervix, and 10 cc of 1% lidocaine was administered in a paracervical fashion. The cervix was serially dilated with Hank dilators. The hysteroscope was passed through the cervix and the above findings were noted. Both sharp curettage and a hysteroscopic scissors were used to remove the endocervical polyp. This completed the procedure. The patient tolerated the procedure well was brought to the recovery room in stable condition for the procedure. All sponge and needle counts correct x2.

## 2014-12-06 NOTE — Anesthesia Procedure Notes (Signed)
Procedure Name: LMA Insertion Date/Time: 12/06/2014 11:57 AM Performed by: Earmon Phoenix Pre-anesthesia Checklist: Patient identified, Patient being monitored, Timeout performed, Emergency Drugs available and Suction available Patient Re-evaluated:Patient Re-evaluated prior to inductionOxygen Delivery Method: Circle system utilized Preoxygenation: Pre-oxygenation with 100% oxygen Intubation Type: IV induction Ventilation: Mask ventilation without difficulty LMA: LMA inserted and LMA with gastric port inserted LMA Size: 4.0 Number of attempts: 1 Placement Confirmation: positive ETCO2,  CO2 detector and breath sounds checked- equal and bilateral Tube secured with: Tape Dental Injury: Teeth and Oropharynx as per pre-operative assessment

## 2014-12-07 ENCOUNTER — Encounter (HOSPITAL_COMMUNITY): Payer: Self-pay | Admitting: Obstetrics and Gynecology

## 2014-12-08 ENCOUNTER — Other Ambulatory Visit: Payer: Self-pay | Admitting: Nurse Practitioner

## 2014-12-08 MED ORDER — BACLOFEN 10 MG PO TABS: 10.0000 mg | ORAL_TABLET | Freq: Three times a day (TID) | ORAL | Status: DC

## 2014-12-26 ENCOUNTER — Ambulatory Visit (INDEPENDENT_AMBULATORY_CARE_PROVIDER_SITE_OTHER): Payer: 59 | Admitting: Nurse Practitioner

## 2014-12-26 ENCOUNTER — Encounter: Payer: Self-pay | Admitting: Nurse Practitioner

## 2014-12-26 DIAGNOSIS — L509 Urticaria, unspecified: Secondary | ICD-10-CM | POA: Diagnosis not present

## 2014-12-26 MED ORDER — METHYLPREDNISOLONE ACETATE 40 MG/ML IJ SUSP
40.0000 mg | Freq: Once | INTRAMUSCULAR | Status: AC
  Administered 2014-12-26: 40 mg via INTRAMUSCULAR

## 2014-12-26 MED ORDER — PREDNISONE 20 MG PO TABS: ORAL_TABLET | ORAL | Status: DC

## 2014-12-26 NOTE — Patient Instructions (Signed)
Loratadine 10 mg in the morning Benadryl 25 mg in the evening Zantac 150 mg daily

## 2014-12-26 NOTE — Progress Notes (Signed)
Subjective:  Presents for complaints of migratory hives that comes and goes over the past week. Worse 2 days ago. 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 No known exposure to any allergens. Itching is very intense especially at nighttime. Right eye was swollen shut, this has improved. No difficulty breathing or swallowing. No wheezing. Had some prednisone at home, has taken 60 mg for the past 3 days. This has helped some.  Objective:   Wt  NAD. Alert, oriented. TMs minimal clear effusion, no erythema. Minimal erythema above the right eye with minimal edema. Conjunctiva clear. Pharynx clear. Neck supple with mild soft anterior adenopathy. Lungs clear. Heart regular rhythm. A picture was shown of multiple slightly raised pink plaques. Has faint areas on the abdomen and neck.  Assessment: Urticaria - Plan: methylPREDNISolone acetate (DEPO-MEDROL) injection 40 mg  Plan:  Meds ordered this encounter  Medications  . predniSONE (DELTASONE) 20 MG tablet    Sig: 3 po qd x 3 d then 2 po qd x 3 d then 1 po qd x 3 d    Dispense:  18 tablet    Refill:  0    Order Specific Question:  Supervising Provider    Answer:  Merlyn Albert [[id number]]  . methylPREDNISolone acetate (DEPO-MEDROL) injection 40 mg    Sig:    OTC antihistamines and Zantac as directed. Warning signs reviewed. Call or go to ED if symptoms worsen, recheck if persists or recurs.

## 2014-12-29 ENCOUNTER — Ambulatory Visit (HOSPITAL_COMMUNITY): Payer: 59

## 2015-01-05 ENCOUNTER — Ambulatory Visit (HOSPITAL_COMMUNITY)
Admission: RE | Admit: 2015-01-05 | Discharge: 2015-01-05 | Disposition: A | Discharge status: Home / Self Care | Payer: 59 | Source: Ambulatory Visit | Attending: Obstetrics and Gynecology | Admitting: Obstetrics and Gynecology

## 2015-01-05 DIAGNOSIS — Z1231 Encounter for screening mammogram for malignant neoplasm of breast: Secondary | ICD-10-CM | POA: Diagnosis not present

## 2015-01-19 ENCOUNTER — Other Ambulatory Visit: Payer: Self-pay | Admitting: Obstetrics and Gynecology

## 2015-01-20 LAB — CYTOLOGY - PAP

## 2015-01-23 ENCOUNTER — Other Ambulatory Visit: Payer: Self-pay | Admitting: Nurse Practitioner

## 2015-01-23 DIAGNOSIS — R5383 Other fatigue: Secondary | ICD-10-CM

## 2015-01-23 DIAGNOSIS — R700 Elevated erythrocyte sedimentation rate: Secondary | ICD-10-CM

## 2015-01-23 DIAGNOSIS — Z79899 Other long term (current) drug therapy: Secondary | ICD-10-CM

## 2015-01-23 DIAGNOSIS — Z13220 Encounter for screening for lipoid disorders: Secondary | ICD-10-CM

## 2015-01-23 DIAGNOSIS — D500 Iron deficiency anemia secondary to blood loss (chronic): Secondary | ICD-10-CM

## 2015-01-23 DIAGNOSIS — D649 Anemia, unspecified: Secondary | ICD-10-CM | POA: Insufficient documentation

## 2015-01-23 DIAGNOSIS — E559 Vitamin D deficiency, unspecified: Secondary | ICD-10-CM | POA: Insufficient documentation

## 2015-01-27 LAB — TSH: TSH: 2.88 u[IU]/mL (ref 0.450–4.500)

## 2015-01-27 LAB — LIPID PANEL
CHOL/HDL RATIO: 5.4 ratio — AB (ref 0.0–4.4)
CHOLESTEROL TOTAL: 162 mg/dL (ref 100–199)
HDL: 30 mg/dL — ABNORMAL LOW (ref >39)
LDL CALC: 117 mg/dL — AB (ref 0–99)
TRIGLYCERIDES: 77 mg/dL (ref 0–149)
VLDL CHOLESTEROL CAL: 15 mg/dL (ref 5–40)

## 2015-01-27 LAB — CBC WITH DIFFERENTIAL/PLATELET
BASOS ABS: 0 10*3/uL (ref 0.0–0.2)
BASOS: 0 %
EOS (ABSOLUTE): 0.2 10*3/uL (ref 0.0–0.4)
Eos: 2 %
Hematocrit: 38.9 % (ref 34.0–46.6)
Hemoglobin: 12.5 g/dL (ref 11.1–15.9)
Immature Grans (Abs): 0 10*3/uL (ref 0.0–0.1)
Immature Granulocytes: 0 %
LYMPHS ABS: 1.9 10*3/uL (ref 0.7–3.1)
Lymphs: 26 %
MCH: 27.7 pg (ref 26.6–33.0)
MCHC: 32.1 g/dL (ref 31.5–35.7)
MCV: 86 fL (ref 79–97)
MONOS ABS: 0.5 10*3/uL (ref 0.1–0.9)
Monocytes: 6 %
NEUTROS ABS: 4.7 10*3/uL (ref 1.4–7.0)
Neutrophils: 66 %
PLATELETS: 389 10*3/uL — AB (ref 150–379)
RBC: 4.52 x10E6/uL (ref 3.77–5.28)
RDW: 15.7 % — AB (ref 12.3–15.4)
WBC: 7.2 10*3/uL (ref 3.4–10.8)

## 2015-01-27 LAB — HEPATIC FUNCTION PANEL
ALBUMIN: 4.1 g/dL (ref 3.5–5.5)
ALK PHOS: 75 IU/L (ref 39–117)
ALT: 17 IU/L (ref 0–32)
AST: 16 IU/L (ref 0–40)
Bilirubin Total: 0.5 mg/dL (ref 0.0–1.2)
Bilirubin, Direct: 0.11 mg/dL (ref 0.00–0.40)
TOTAL PROTEIN: 7 g/dL (ref 6.0–8.5)

## 2015-01-27 LAB — BASIC METABOLIC PANEL
BUN / CREAT RATIO: 19 (ref 8–20)
BUN: 13 mg/dL (ref 6–20)
CHLORIDE: 100 mmol/L (ref 97–108)
CO2: 21 mmol/L (ref 18–29)
CREATININE: 0.67 mg/dL (ref 0.57–1.00)
Calcium: 9.2 mg/dL (ref 8.7–10.2)
GFR calc Af Amer: 129 mL/min/{1.73_m2} (ref >59)
GFR calc non Af Amer: 112 mL/min/{1.73_m2} (ref >59)
GLUCOSE: 93 mg/dL (ref 65–99)
Potassium: 4.6 mmol/L (ref 3.5–5.2)
SODIUM: 137 mmol/L (ref 134–144)

## 2015-01-27 LAB — SEDIMENTATION RATE: Sed Rate: 32 mm/hr (ref 0–32)

## 2015-01-27 LAB — FERRITIN: FERRITIN: 36 ng/mL (ref 15–150)

## 2015-01-27 LAB — VITAMIN D 25 HYDROXY (VIT D DEFICIENCY, FRACTURES): Vit D, 25-Hydroxy: 22.4 ng/mL — ABNORMAL LOW (ref 30.0–100.0)

## 2015-02-14 ENCOUNTER — Telehealth: Payer: Self-pay | Admitting: Family Medicine

## 2015-02-14 ENCOUNTER — Other Ambulatory Visit: Payer: Self-pay | Admitting: Nurse Practitioner

## 2015-02-14 MED ORDER — SULFAMETHOXAZOLE-TRIMETHOPRIM 800-160 MG PO TABS: 1.0000 | ORAL_TABLET | Freq: Two times a day (BID) | ORAL | Status: DC

## 2015-02-14 NOTE — Telephone Encounter (Signed)
Spoke with patient earlier. Small skin tag removed from bottom of both breasts. Right one healing well. Left one has superficial open area with slight yellowish discoloration, faint surrounding erythema and tender to palpation. Most likely secondary infection after procedure. Will send in Rx for antibiotics. Recheck if worsens or persists.

## 2015-02-14 NOTE — Telephone Encounter (Signed)
Patient has infected skin tag,it was taken out couple of weeks ago.call into [facility].

## 2015-02-21 ENCOUNTER — Ambulatory Visit: Payer: 59 | Admitting: Cardiovascular Disease

## 2015-03-29 ENCOUNTER — Encounter: Payer: Self-pay | Admitting: Cardiovascular Disease

## 2015-03-29 ENCOUNTER — Ambulatory Visit (INDEPENDENT_AMBULATORY_CARE_PROVIDER_SITE_OTHER): Payer: 59 | Admitting: Cardiovascular Disease

## 2015-03-29 VITALS — BP 104/82 | HR 85 | Ht 67.0 in | Wt 269.0 lb

## 2015-03-29 DIAGNOSIS — Z8679 Personal history of other diseases of the circulatory system: Secondary | ICD-10-CM | POA: Diagnosis not present

## 2015-03-29 DIAGNOSIS — I498 Other specified cardiac arrhythmias: Secondary | ICD-10-CM

## 2015-03-29 DIAGNOSIS — I499 Cardiac arrhythmia, unspecified: Secondary | ICD-10-CM

## 2015-03-29 DIAGNOSIS — Q210 Ventricular septal defect: Secondary | ICD-10-CM

## 2015-03-29 DIAGNOSIS — Z136 Encounter for screening for cardiovascular disorders: Secondary | ICD-10-CM

## 2015-03-29 NOTE — Progress Notes (Signed)
Patient ID: Christina Rodriguez, female   DOB: [birth year], 39 y.o.   MRN: [id number]       CARDIOLOGY CONSULT NOTE  Patient ID: Christina Rodriguez MRN: [id number] DOB/AGE: 11/10/1975 39 y.o.  Admit date: (Not on file) Primary Physician Lubertha South, MD  Reason for Consultation: SVT, VSD  HPI: The patient is a 39 year old woman with a reported history of SVT and VSD as per the electronic medical record. I do not have much by way of past medical documentation regarding this. THere had been no recent echocardiograms or stress tests that I can find. A review of lab work shows lipid panel on 01/26/15 with total cholesterol 162, triglycerides 77, HDL 30, LDL 117. Platelets mildly elevated at 389. Basic metabolic panel was normal.  She is feeling well and denies chest pain, leg swelling, dizziness, shortness of breath, orthopnea, paroxysmal nocturnal dyspnea, and syncope. She very seldom has palpitations and takes long-acting diltiazem which has been refilled by her PCP, Dr. 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  She has been on Lasix 80 mg daily with supplemental potassium for several years and has not tried coming off of it.  ECG performed in the office today demonstrates sinus rhythm with frequent PVCs in a pattern of ventricular bigeminy.    Allergies  Allergen Reactions  . Beta Adrenergic Blockers Shortness Of Breath  . Shrimp [Shellfish Allergy] Hives  . Soybeans Hives    Current Outpatient Prescriptions  Medication Sig Dispense Refill  . albuterol (PROVENTIL HFA;VENTOLIN HFA) 108 (90 BASE) MCG/ACT inhaler Inhale 2 puffs into the lungs every 4 (four) hours as needed for wheezing or shortness of breath. 1 Inhaler 5  . baclofen (LIORESAL) 10 MG tablet Take 1 tablet (10 mg total) by mouth 3 (three) times daily. 30 each 0  . diltiazem (CARDIZEM CD) 240 MG 24 hr capsule Take 1 capsule (240 mg total) by mouth daily. 90 capsule 1  . EPINEPHrine 0.3 mg/0.3 mL IJ  SOAJ injection Inject 0.3 mg into the muscle once.    . Fluticasone-Salmeterol (ADVAIR DISKUS) 250-50 MCG/DOSE AEPB Inhale 1 puff into the lungs 2 (two) times daily.      . furosemide (LASIX) 80 MG tablet Take 1 tablet (80 mg total) by mouth daily. 90 tablet 1  . ibuprofen (ADVIL,MOTRIN) 200 MG tablet Take 400 mg by mouth every 6 (six) hours as needed for headache, mild pain or moderate pain.    . metFORMIN (GLUCOPHAGE-XR) 500 MG 24 hr tablet Take 2 tablets (1,000 mg total) by mouth daily with breakfast. 180 tablet 1  . Multiple Vitamins-Minerals (MULTIVITAMIN ADULT PO) Take 1 tablet by mouth daily.    . norethindrone (MICRONOR,CAMILA,ERRIN) 0.35 MG tablet Take 1 tablet (0.35 mg total) by mouth daily. 3 Package 3  . ondansetron (ZOFRAN-ODT) 8 MG disintegrating tablet Take 1 tablet (8 mg total) by mouth every 8 (eight) hours as needed for nausea or vomiting. 30 tablet 0  . pantoprazole (PROTONIX) 40 MG tablet Take 1 tablet (40 mg total) by mouth daily. (Patient taking differently: Take 40 mg by mouth daily as needed (acid reflux). ) 90 tablet 1  . rizatriptan (MAXALT-MLT) 10 MG disintegrating tablet Take 1 tablet (10 mg total) by mouth as needed for migraine. May repeat in 2 hours if needed; max 2 per 24 hours 27 tablet 1  . sulfamethoxazole-trimethoprim (BACTRIM DS,SEPTRA DS) 800-160 MG tablet Take 1 tablet by mouth 2 (two) times daily. 14 tablet 0  No current facility-administered medications for this visit.    Past Medical History  Diagnosis Date  . PONV (postoperative nausea and vomiting)   . Dysrhythmia     history of svt-controlled on cardizem-diagnosed in 1999-  . Asthma     albuterol inhaler prn-recent uri-using more freq  . Recurrent upper respiratory infection (URI)     resolving  . VSD (ventricular septal defect)     history of -no problems  . GERD (gastroesophageal reflux disease)     uses protonix  . History of polycystic ovarian disease   . Headache     migraines   .  Anemia     HIstory of     Past Surgical History  Procedure Laterality Date  . Cesarean section  2010  . Cholecystectomy  2003  . Cesarean section  11/16/2010    Procedure: CESAREAN SECTION;  Surgeon: Almon Hercules;  Location: [facility];  Service: Gynecology;  Laterality: N/A;  Repeat   . Uterine polyps    . Dilation and curettage of uterus    . Hysteroscopy w/d&c N/A 12/06/2014    Procedure: DILATATION AND CURETTAGE /HYSTEROSCOPY;  Surgeon: Waynard Reeds, MD;  Location: [facility];  Service: Gynecology;  Laterality: N/A;    Social History   Social History  . Marital Status: Married    Spouse Name: N/A  . Number of Children: N/A  . Years of Education: N/A   Occupational History  . Not on file.   Social History Main Topics  . Smoking status: Never Smoker   . Smokeless tobacco: Never Used  . Alcohol Use: No  . Drug Use: No  . Sexual Activity: Not on file   Other Topics Concern  . Not on file   Social History Narrative     No family history of premature CAD in 1st degree relatives.  Prior to Admission medications   Medication Sig Start Date End Date Taking? Authorizing Provider  albuterol (PROVENTIL HFA;VENTOLIN HFA) 108 (90 BASE) MCG/ACT inhaler Inhale 2 puffs into the lungs every 4 (four) hours as needed for wheezing or shortness of breath. 05/07/13  Yes Merlyn Albert, MD  baclofen (LIORESAL) 10 MG tablet Take 1 tablet (10 mg total) by mouth 3 (three) times daily. 12/08/14  Yes Campbell Riches, NP  diltiazem (CARDIZEM CD) 240 MG 24 hr capsule Take 1 capsule (240 mg total) by mouth daily. 11/15/14  Yes Campbell Riches, NP  EPINEPHrine 0.3 mg/0.3 mL IJ SOAJ injection Inject 0.3 mg into the muscle once.   Yes Historical Provider, MD  Fluticasone-Salmeterol (ADVAIR DISKUS) 250-50 MCG/DOSE AEPB Inhale 1 puff into the lungs 2 (two) times daily.     Yes Historical Provider, MD  furosemide (LASIX) 80 MG tablet Take 1 tablet (80 mg total) by mouth daily. 11/15/14  Yes Campbell Riches,  NP  ibuprofen (ADVIL,MOTRIN) 200 MG tablet Take 400 mg by mouth every 6 (six) hours as needed for headache, mild pain or moderate pain.   Yes Historical Provider, MD  metFORMIN (GLUCOPHAGE-XR) 500 MG 24 hr tablet Take 2 tablets (1,000 mg total) by mouth daily with breakfast. 11/15/14  Yes Campbell Riches, NP  Multiple Vitamins-Minerals (MULTIVITAMIN ADULT PO) Take 1 tablet by mouth daily.   Yes Historical Provider, MD  norethindrone (MICRONOR,CAMILA,ERRIN) 0.35 MG tablet Take 1 tablet (0.35 mg total) by mouth daily. 09/07/14  Yes Campbell Riches, NP  ondansetron (ZOFRAN-ODT) 8 MG disintegrating tablet Take 1 tablet (8 mg total) by mouth every  8 (eight) hours as needed for nausea or vomiting. 10/13/14  Yes Campbell Richesarolyn C Hoskins, NP  pantoprazole (PROTONIX) 40 MG tablet Take 1 tablet (40 mg total) by mouth daily. Patient taking differently: Take 40 mg by mouth daily as needed (acid reflux).  08/16/13  Yes Campbell Richesarolyn C Hoskins, NP  rizatriptan (MAXALT-MLT) 10 MG disintegrating tablet Take 1 tablet (10 mg total) by mouth as needed for migraine. May repeat in 2 hours if needed; max 2 per 24 hours 09/06/13  Yes Campbell Richesarolyn C Hoskins, NP  sulfamethoxazole-trimethoprim (BACTRIM DS,SEPTRA DS) 800-160 MG tablet Take 1 tablet by mouth 2 (two) times daily. 02/14/15  Yes Campbell Richesarolyn C Hoskins, NP     Review of systems complete and found to be negative unless listed above in HPI     Physical exam Pulse 91, height 5\' 7"  (1.702 m), weight 269 lb (122.018 kg), SpO2 99 %. General: NAD Neck: No JVD, no thyromegaly or thyroid nodule.  Lungs: Clear to auscultation bilaterally with normal respiratory effort. CV: Nondisplaced PMI. Regular rate and rhythm, normal S1/S2, no S3/S4, no murmur.  No peripheral edema.  No carotid bruit.   Abdomen: Soft, obese, no distention.  Skin: Intact without lesions or rashes.  Neurologic: Alert and oriented x 3.  Psych: Normal affect. Extremities: No clubbing or cyanosis.  HEENT: Normal.   ECG:  Most recent ECG reviewed.  Labs:   Lab Results  Component Value Date   WBC 7.2 01/26/2015   HGB 11.7* 11/16/2014   HCT 38.9 01/26/2015   MCV 84.8 11/16/2014   PLT 340 11/16/2014   No results for input(s): NA, K, CL, CO2, BUN, CREATININE, CALCIUM, PROT, BILITOT, ALKPHOS, ALT, AST, GLUCOSE in the last 168 hours.  Invalid input(s): LABALBU No results found for: CKTOTAL, CKMB, CKMBINDEX, TROPONINI  Lab Results  Component Value Date   CHOL 162 01/26/2015   CHOL 139 01/20/2014   Lab Results  Component Value Date   HDL 30* 01/26/2015   HDL 30* 01/20/2014   Lab Results  Component Value Date   LDLCALC 117* 01/26/2015   LDLCALC 87 01/20/2014   Lab Results  Component Value Date   TRIG 77 01/26/2015   TRIG 108 01/20/2014   Lab Results  Component Value Date   CHOLHDL 5.4* 01/26/2015   CHOLHDL 4.6 01/20/2014   No results found for: LDLDIRECT       Studies: No results found.  ASSESSMENT AND PLAN:  1. PSVT: Symptomatically stable on Cardizem CD 240 mg. No changes.  2. VSD: No h/o repair. No murmurs appreciated and asymptomatic. No indication for echocardiography at this time. Will d/c Lasix. If leg swelling recurs, would consider echo at that time.  3. 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 No changes to therapy.  Dispo: f/u prn   Signed: Prentice DockerSuresh Koneswaran, M.D., F.A.C.C.  03/29/2015, 8:44 AM

## 2015-03-29 NOTE — Patient Instructions (Signed)
Your physician recommends that you schedule a follow-up appointment in: as needed    STOP Lasix      Thank you for choosing [facility] !

## 2015-04-05 ENCOUNTER — Other Ambulatory Visit: Payer: Self-pay | Admitting: Nurse Practitioner

## 2015-04-05 MED ORDER — AZITHROMYCIN 250 MG PO TABS: ORAL_TABLET | ORAL | Status: DC

## 2015-04-24 ENCOUNTER — Ambulatory Visit (INDEPENDENT_AMBULATORY_CARE_PROVIDER_SITE_OTHER): Payer: 59 | Admitting: Nurse Practitioner

## 2015-04-24 ENCOUNTER — Encounter: Payer: Self-pay | Admitting: Nurse Practitioner

## 2015-04-24 VITALS — Temp 98.2°F

## 2015-04-24 DIAGNOSIS — M5442 Lumbago with sciatica, left side: Secondary | ICD-10-CM

## 2015-04-24 MED ORDER — GABAPENTIN 100 MG PO CAPS: 100.0000 mg | ORAL_CAPSULE | Freq: Three times a day (TID) | ORAL | Status: DC

## 2015-04-24 MED ORDER — NAPROXEN 500 MG PO TABS: 500.0000 mg | ORAL_TABLET | Freq: Two times a day (BID) | ORAL | Status: DC

## 2015-04-24 NOTE — Patient Instructions (Signed)
Icy Hot Smart Relief TENS unit or Aleve TENS unit

## 2015-04-26 ENCOUNTER — Encounter: Payer: Self-pay | Admitting: Nurse Practitioner

## 2015-04-26 NOTE — Progress Notes (Signed)
Subjective:  Presents for complaints of left low back pain that began this Sunday after Thanksgiving. No specific history of injury. States she woke up with the pain. Pain in the left low back area going into the left hip down to the lateral thigh to behind the knee. Describes as a pulling sensation. Leg is weak at times, has buckled at least twice. Has fallen at least once, landed on her hands and knees. Symptoms have been worse lately. No change in bowel or bladder habit. Worse with getting up and down, bending or squatting. Worse at nighttime. No relief with massage therapy. Some relief with heat and ibuprofen.  Objective:   Temp(Src) 98.2 F (36.8 C) (Oral)  LMP 03/23/2015 NAD. Alert, oriented.Some difficulty getting onto the exam table. Tenderness noted in the left lumbar area towards the left hip. SLR positive on the left, negative on the right. Reflexes normal lower extremities. ROM of the back: 30 flexion, limited hyper extension, no problems with rotation or lateral movement.  Assessment: Left-sided low back pain with left-sided sciatica  Plan:  Meds ordered this encounter  Medications  . gabapentin (NEURONTIN) 100 MG capsule    Sig: Take 1 capsule (100 mg total) by mouth 3 (three) times daily.    Dispense:  60 capsule    Refill:  2    Order Specific Question:  Supervising Provider    Answer:  Merlyn AlbertLUKING, WILLIAM S [[id number]]  . naproxen (NAPROSYN) 500 MG tablet    Sig: Take 1 tablet (500 mg total) by mouth 2 (two) times daily with a meal.    Dispense:  60 tablet    Refill:  0    Order Specific Question:  Supervising Provider    Answer:  Merlyn AlbertLUKING, WILLIAM S [[id number]]   Discussed options. Switch to naproxen as directed. Add TENS unit. Hold on MRI at this point, if no improvement with gabapentin, will schedule MRI of the low back.  Recheck if symptoms worsen or persist.

## 2015-05-02 ENCOUNTER — Other Ambulatory Visit: Payer: Self-pay | Admitting: Nurse Practitioner

## 2015-05-02 MED ORDER — METHOCARBAMOL 750 MG PO TABS: 750.0000 mg | ORAL_TABLET | Freq: Three times a day (TID) | ORAL | Status: DC | PRN

## 2015-05-03 ENCOUNTER — Encounter: Payer: Self-pay | Admitting: Nutrition

## 2015-05-03 ENCOUNTER — Encounter: Payer: Self-pay | Admitting: Nurse Practitioner

## 2015-05-03 ENCOUNTER — Encounter: Payer: 59 | Attending: Family Medicine | Admitting: Nutrition

## 2015-05-03 ENCOUNTER — Ambulatory Visit (INDEPENDENT_AMBULATORY_CARE_PROVIDER_SITE_OTHER): Payer: 59 | Admitting: Nurse Practitioner

## 2015-05-03 VITALS — BP 124/86

## 2015-05-03 DIAGNOSIS — Z713 Dietary counseling and surveillance: Secondary | ICD-10-CM | POA: Insufficient documentation

## 2015-05-03 DIAGNOSIS — M5442 Lumbago with sciatica, left side: Secondary | ICD-10-CM | POA: Diagnosis not present

## 2015-05-03 DIAGNOSIS — E282 Polycystic ovarian syndrome: Secondary | ICD-10-CM | POA: Diagnosis not present

## 2015-05-03 MED ORDER — PREDNISONE 20 MG PO TABS: ORAL_TABLET | ORAL | Status: DC

## 2015-05-03 NOTE — Progress Notes (Signed)
  Medical Nutrition Therapy:  Appt start time: 1330 end time:  1430.  Assessment:  Primary concerns today: Obesity. LIves with her husband and 2 kids. She does the cooking and shopping. Most foods are baked or fried. Desires to weigh 225 lbs. Had questionable  gestational DM with 10 lb baby. Works full time as [employer]. Physical activity: walks some. Has PCOS and is on Metformin for that. Diet is high in empty calories from fat, sodium and sugar and low in fresh fruits and vegetables.  Drinks a lot of diet sodas. Not getting much exercise other than at work and taking care of kids.  Preferred Learning Style:   No preference indicated   Learning Readiness:   Ready  Change in progress   MEDICATIONS:    DIETARY INTAKE  24-hr recall:  B ( AM): Sandwich or  Biscuit. Diet Coke  Snk ( AM): snack: misc or crackers  L ( PM): Takes lunch usually; sandwich and chips, Diet coke Snk ( PM): none D ( PM): PIzza - 2 slices, 1 cheese stick and diet coke Snk ( PM): coconut cake: nothing usually Beverages:  Diet Coke, water  Usual physical activity: Some but not a lot outside of work.   Estimated energy needs: 1500 calories 170 g carbohydrates 112 g protein 42 g fat  Progress Towards Goal(s):  In progress.   Nutritional Diagnosis:  NB-1.1 Food and nutrition-related knowledge deficit As related to Obesity.  As evidenced by BMI >40.     Intervention:  Low fat low sodium high fiber diet using the Plate Method, carb counting due to PCOS, portion sizes, benefits of exercise for needed weight loss, timing of meals and balanced meals. Healthy weight loss tips..   Goals: 1. Follow Plate Method 2. Cut back to 1 diet cokes a day. 3. Cut back on fried foods. 4. Cut fast food breakfasts. 5. Drink 5 bottles of water per day. 6. Lose 1-2 lbs per 7. Exercise 30 minutes three times per day.   Teaching Method Utilized:  Visual Auditory Hands on  Handouts given during visit include:  The Plate  Method  Meal Plan Card  Barriers to learning/adherence to lifestyle change: None  Demonstrated degree of understanding via:  Teach Back   Monitoring/Evaluation:  Dietary intake, exercise, meal planning, and body weight in 1 month(s).

## 2015-05-03 NOTE — Patient Instructions (Signed)
Goals: 1. Follow Plate Method 2. Cut back to 1 diet cokes a day. 3. Cut back on fried foods. 4. Cut fast food breakfasts. 5. Drink 5 bottles of water per day. 6. Lose 1-2 lbs per 7. Exercise 30 minutes three times per day.

## 2015-05-03 NOTE — Progress Notes (Signed)
Subjective:  Presents for recheck of her back pain. Continues to have pain in the left low back area radiating down the left leg. Currently on Neurontin 100 mg twice a day. Minimal improvement. Worse with bending or prolonged standing. See previous note.  Objective:   BP 124/86 mmHg NAD. Alert, oriented. Lungs clear. Heart RRR. Area of tenderness left lumbar area. Negative SLR right; positive on left. Reflexes normal lower extremities. Gain slow but steady.   Assessment: Left-sided low back pain with left-sided sciatica  Plan:  Meds ordered this encounter  Medications  . predniSONE (DELTASONE) 20 MG tablet    Sig: 3 po qd x 3 d then 2 po qd x 3 d then 1 po qd x 3 d    Dispense:  18 tablet    Refill:  0    Order Specific Question:  Supervising Provider    Answer:  Merlyn AlbertLUKING, WILLIAM S [[id number]]   Increase Neurontin to 200 mg BID with possible increase next week. Defers MRI today. Recheck next week if no improvement.

## 2015-06-14 ENCOUNTER — Telehealth: Payer: Self-pay | Admitting: *Deleted

## 2015-06-14 ENCOUNTER — Other Ambulatory Visit: Payer: Self-pay | Admitting: Nurse Practitioner

## 2015-06-14 MED ORDER — AZITHROMYCIN 250 MG PO TABS: ORAL_TABLET | ORAL | Status: DC

## 2015-06-14 NOTE — Telephone Encounter (Signed)
Sinus symptoms. Can antibiotic be called into [facility].

## 2015-06-14 NOTE — Telephone Encounter (Signed)
Done. Office visit if no improvement.

## 2015-06-16 ENCOUNTER — Encounter: Payer: 59 | Attending: Family Medicine | Admitting: Nutrition

## 2015-06-16 DIAGNOSIS — Z713 Dietary counseling and surveillance: Secondary | ICD-10-CM | POA: Insufficient documentation

## 2015-06-16 DIAGNOSIS — E282 Polycystic ovarian syndrome: Secondary | ICD-10-CM | POA: Diagnosis not present

## 2015-06-16 DIAGNOSIS — R739 Hyperglycemia, unspecified: Secondary | ICD-10-CM

## 2015-06-16 NOTE — Patient Instructions (Addendum)
Goals: 1. Follow Plate Method 2. Increase exercise 30 minutes three times per week. 3. Increase water intake to 4 bottles per day. 4. Increase low carb vegetables to 2 servings per day. 5. Lose 1 lb per week  Or 4/5 lbs per month.

## 2015-06-16 NOTE — Progress Notes (Signed)
  Medical Nutrition Therapy:  Appt start time: 1330 end time:  1400.  Assessment:  Primary concerns today: Obesity. Lost 3 lbs since last. Currently on Metformin due to elevated BS/PCOS in the past. No recent A1C for evaluation. Walking some at home.Lost 3 lbs since last visit. She is trying to eat better. Her husband is working with her to lose weight. Avoiding snacks and eating healthier meals. Taking her lunch to work now. Walking some with office staff at lunch. Has cut out a lot of fast foods and eating out as much.  Needs more fresh vegetables and fruit daily and high fiber foods. Needs more water and cut out lemonade. Still drinking diet sodas and needs to get rid of them in her diet. Making slow progress. Is on her monthly cycle right now and feels she would have lost more.  Preferred Learning Style:   No preference indicated   Learning Readiness:   Ready  Change in progress   MEDICATIONS:    DIETARY INTAKE  24-hr recall:  B ( AM): Oatmeal or grits: water Snk ( AM):  L ( PM): [place] sandwich on honey wheat with mayo,  Chips 10, Water Snk ( PM): none D ( PM):  Egg sandwich, Lemonade Snk ( PM): Few girl scout  Cookies. Beverages:  Diet Coke, water  Usual physical activity: Some but not a lot outside of work.   Estimated energy needs: 1500 calories 170 g carbohydrates 112 g protein 42 g fat  Progress Towards Goal(s):  In progress.   Nutritional Diagnosis:  NB-1.1 Food and nutrition-related knowledge deficit As related to Obesity.  As evidenced by BMI >40.     Intervention:  Low fat low sodium high fiber diet using the Plate Method, carb counting due to PCOS, portion sizes, benefits of exercise for needed weight loss, timing of meals and balanced meals. Healthy weight loss tips..   Goals: 1. Follow Plate Method 2. Cut back to 1 diet cokes a day. 3. Cut back on fried foods. 4. Cut fast food breakfasts. 5. Drink 5 bottles of water per day. 6. Lose 1-2 lbs  per 7. Exercise 30 minutes three times per day.   Teaching Method Utilized:  Visual Auditory Hands on  Handouts given during visit include:  The Plate Method  Meal Plan Card  Barriers to learning/adherence to lifestyle change: None  Demonstrated degree of understanding via:  Teach Back   Monitoring/Evaluation:  Dietary intake, exercise, meal planning, and body weight in 1 month(s).

## 2015-06-19 ENCOUNTER — Encounter: Payer: Self-pay | Admitting: Nutrition

## 2015-07-11 ENCOUNTER — Other Ambulatory Visit: Payer: Self-pay | Admitting: Nurse Practitioner

## 2015-07-11 MED ORDER — NAPROXEN 500 MG PO TABS: 500.0000 mg | ORAL_TABLET | Freq: Two times a day (BID) | ORAL | Status: DC

## 2015-07-11 MED FILL — NAPROXEN 500 MG TABLET: 500 | 30 days supply | Qty: 60 | Fill #0

## 2015-07-12 MED FILL — MONO-LINYAH 28 TABLET: 0.25-35 | 84 days supply | Qty: 84 | Fill #2

## 2015-08-17 ENCOUNTER — Encounter: Payer: Self-pay | Admitting: Nutrition

## 2015-08-17 ENCOUNTER — Encounter: Payer: 59 | Attending: Family Medicine | Admitting: Nutrition

## 2015-08-17 DIAGNOSIS — Z713 Dietary counseling and surveillance: Secondary | ICD-10-CM | POA: Insufficient documentation

## 2015-08-17 DIAGNOSIS — Z8742 Personal history of other diseases of the female genital tract: Secondary | ICD-10-CM

## 2015-08-17 DIAGNOSIS — E282 Polycystic ovarian syndrome: Secondary | ICD-10-CM | POA: Insufficient documentation

## 2015-08-17 NOTE — Progress Notes (Signed)
  Medical Nutrition Therapy:  Appt start time: 1330 end time:  1400.  Assessment:  Primary concerns today: Obesity . Lost  2 lbs in the last month. Changes made: Doesn't eat out breakfast anymore. Has cut out eating out a lot. Cooking more at home. Taking her lunch to work now. Not able to exercise as much as she wanted to. Drinking more water. Still on Metformin. Diet is still low in fresh fruits and vegetables and high fiber foods.  Needs more exercise.  Marland Kitchen. Preferred Learning Style:   No preference indicated   Learning Readiness:   Ready  Change in progress   MEDICATIONS:    DIETARY INTAKE  24-hr recall:  B ( AM): Oatmeal or egg sandwich, watter Snk ( AM):  L ( PM): bologona and cheese on honey wheat bread, Lemonade SF Snk ( PM): none D ( PM ) Sandwich- [place] on honeywheat. Water Snk ( PM):   Beverages:  Diet Coke, water  Usual physical activity: Some but not a lot outside of work.   Estimated energy needs: 1500 calories 170 g carbohydrates 112 g protein 42 g fat  Progress Towards Goal(s):  In progress.   Nutritional Diagnosis:  NB-1.1 Food and nutrition-related knowledge deficit As related to Obesity.  As evidenced by BMI >40.     Intervention:  Low fat low sodium high fiber diet using the Plate Method, carb counting due to PCOS, portion sizes, benefits of exercise for needed weight loss, timing of meals and balanced meals. Healthy weight loss tips..   Goals: 1. Follow Plate Method 2. Cut back to 1 diet cokes a day. 3.  Drink 5 bottles of water per day. 4 Lose 1 lb per week.  5 Exercise 30 minutes three times per day.  6.Increase veggies to 2 per meals.    Teaching Method Utilized:  Visual Auditory Hands on  Handouts given during visit include:  The Plate Method  Meal Plan Card  Barriers to learning/adherence to lifestyle change: None  Demonstrated degree of understanding via:  Teach Back   Monitoring/Evaluation:  Dietary intake, exercise, meal  planning, and body weight in 1 month(s).

## 2015-08-17 NOTE — Patient Instructions (Signed)
Goals: 1. Follow Plate Method 2. Cut back to 1 diet cokes a day. 3.  Drink 5 bottles of water per day. 4 Lose 1 lb per week.  5 Exercise 30 minutes three times per day.  6.Increase veggies to 2 per meals.

## 2015-08-24 ENCOUNTER — Ambulatory Visit (INDEPENDENT_AMBULATORY_CARE_PROVIDER_SITE_OTHER): Payer: 59 | Admitting: Nurse Practitioner

## 2015-08-24 ENCOUNTER — Ambulatory Visit (HOSPITAL_COMMUNITY)
Admission: RE | Admit: 2015-08-24 | Discharge: 2015-08-24 | Disposition: A | Discharge status: Home / Self Care | Payer: 59 | Source: Ambulatory Visit | Attending: Nurse Practitioner | Admitting: Nurse Practitioner

## 2015-08-24 ENCOUNTER — Encounter: Payer: Self-pay | Admitting: Nurse Practitioner

## 2015-08-24 VITALS — BP 132/84

## 2015-08-24 DIAGNOSIS — M25562 Pain in left knee: Secondary | ICD-10-CM

## 2015-08-24 DIAGNOSIS — M5442 Lumbago with sciatica, left side: Secondary | ICD-10-CM

## 2015-08-24 MED ORDER — PREDNISONE 20 MG PO TABS: ORAL_TABLET | ORAL | Status: DC

## 2015-08-24 NOTE — Progress Notes (Signed)
Subjective:  Presents for recheck. Continues to have left low back pain radiating into the back of the left buttock down the side of the leg to the lower leg. Basically unchanged. Patient states she's learned to live with it. Has deferred further workup. Today the main problem is left knee pain which has bothered her for a while but is much worse over the past week. No specific history of injury. Has to have assistance getting out of the tub or up from a seated position on the floor. States the knee feels "full". Not stable at times, seems to give. Also popping sounds. Difficulty fully extending or flexing the knee. Has been limping lately. Has appointment with orthopedic specialist on 5/2. Pain has become intense enough that she felt she could not wait that long.  Objective:   BP 132/84 mmHg NAD. Alert, oriented. Positive tenderness in the left low back area near the sciatic notch. SLR positive on the left negative on the right. Tenderness with palpation of the anterior portion of knee more on the medial side, mild edema, no erythema. Mild crepitus. No significant joint laxity on exam exam very limited due to tenderness. Extremely tender with full flexion or extension.  Assessment: Left knee pain - Plan: DG Knee Complete 4 Views Left  Left-sided low back pain with left-sided sciatica - Plan: DG Knee Complete 4 Views Left  Plan:  Meds ordered this encounter  Medications  . predniSONE (DELTASONE) 20 MG tablet    Sig: 3 po qd x 3 d then 2 po qd x 3 d then 1 po qd x 3 d    Dispense:  18 tablet    Refill:  0    Order Specific Question:  Supervising Provider    Answer:  Merlyn AlbertLUKING, WILLIAM S [[id number]]   Discussed options. Patient is on he tried naproxen and ibuprofen with minimal relief. Start prednisone taper. Defers any short-term use of opiates. X-ray pending. Given prescription for knee brace. Ice applications. Follow-up with orthopedic specialist as planned.

## 2015-09-05 DIAGNOSIS — M545 Low back pain: Secondary | ICD-10-CM | POA: Diagnosis not present

## 2015-09-05 DIAGNOSIS — M25562 Pain in left knee: Secondary | ICD-10-CM | POA: Diagnosis not present

## 2015-09-28 ENCOUNTER — Ambulatory Visit: Payer: 59 | Admitting: Nutrition

## 2015-09-28 MED FILL — MONO-LINYAH 28 TABLET: 0.25-35 | 84 days supply | Qty: 84 | Fill #3

## 2015-10-06 ENCOUNTER — Other Ambulatory Visit: Payer: Self-pay | Admitting: Obstetrics and Gynecology

## 2015-10-06 DIAGNOSIS — Z1231 Encounter for screening mammogram for malignant neoplasm of breast: Secondary | ICD-10-CM

## 2015-10-11 ENCOUNTER — Other Ambulatory Visit: Payer: Self-pay | Admitting: Nurse Practitioner

## 2015-10-11 MED ORDER — FLUCONAZOLE 150 MG PO TABS: ORAL_TABLET | ORAL | Status: DC

## 2015-10-11 MED ORDER — AZITHROMYCIN 250 MG PO TABS: ORAL_TABLET | ORAL | Status: DC

## 2015-10-11 NOTE — Progress Notes (Signed)
Patient requesting Zpack and Diflucan for her upcoming trip to have on hand in case it is needed.

## 2015-10-24 DIAGNOSIS — M25561 Pain in right knee: Secondary | ICD-10-CM | POA: Diagnosis not present

## 2015-10-25 ENCOUNTER — Other Ambulatory Visit (HOSPITAL_COMMUNITY): Payer: Self-pay | Admitting: Orthopaedic Surgery

## 2015-10-25 DIAGNOSIS — M25562 Pain in left knee: Secondary | ICD-10-CM

## 2015-12-22 MED FILL — MONO-LINYAH 28 TABLET: 0.25-35 | 84 days supply | Qty: 84 | Fill #4

## 2015-12-25 ENCOUNTER — Other Ambulatory Visit: Payer: Self-pay | Admitting: Nurse Practitioner

## 2015-12-25 MED ORDER — FLUCONAZOLE 150 MG PO TABS
ORAL_TABLET | ORAL | 0 refills | Status: DC
Start: 2015-12-25 — End: 2016-02-16

## 2015-12-25 MED FILL — FLUCONAZOLE 150 MG TABLET: 150 | 4 days supply | Qty: 2 | Fill #0

## 2015-12-27 ENCOUNTER — Other Ambulatory Visit (HOSPITAL_COMMUNITY): Payer: Self-pay | Admitting: Orthopaedic Surgery

## 2015-12-27 DIAGNOSIS — M25562 Pain in left knee: Secondary | ICD-10-CM

## 2016-01-11 ENCOUNTER — Ambulatory Visit
Admission: RE | Admit: 2016-01-11 | Discharge: 2016-01-11 | Disposition: A | Discharge status: Home / Self Care | Payer: 59 | Source: Ambulatory Visit | Attending: Obstetrics and Gynecology | Admitting: Obstetrics and Gynecology

## 2016-01-11 DIAGNOSIS — Z1231 Encounter for screening mammogram for malignant neoplasm of breast: Secondary | ICD-10-CM | POA: Diagnosis not present

## 2016-01-24 ENCOUNTER — Other Ambulatory Visit: Payer: Self-pay | Admitting: Obstetrics and Gynecology

## 2016-01-24 DIAGNOSIS — Z6841 Body Mass Index (BMI) 40.0 and over, adult: Secondary | ICD-10-CM | POA: Diagnosis not present

## 2016-01-24 DIAGNOSIS — Z124 Encounter for screening for malignant neoplasm of cervix: Secondary | ICD-10-CM | POA: Diagnosis not present

## 2016-01-24 DIAGNOSIS — Z01419 Encounter for gynecological examination (general) (routine) without abnormal findings: Secondary | ICD-10-CM | POA: Diagnosis not present

## 2016-01-26 LAB — CYTOLOGY - PAP

## 2016-02-02 DIAGNOSIS — H1859 Other hereditary corneal dystrophies: Secondary | ICD-10-CM | POA: Diagnosis not present

## 2016-02-13 DIAGNOSIS — N939 Abnormal uterine and vaginal bleeding, unspecified: Secondary | ICD-10-CM | POA: Diagnosis not present

## 2016-02-16 ENCOUNTER — Ambulatory Visit (INDEPENDENT_AMBULATORY_CARE_PROVIDER_SITE_OTHER): Payer: 59 | Admitting: Nurse Practitioner

## 2016-02-16 DIAGNOSIS — E559 Vitamin D deficiency, unspecified: Secondary | ICD-10-CM

## 2016-02-16 DIAGNOSIS — R5383 Other fatigue: Secondary | ICD-10-CM

## 2016-02-16 DIAGNOSIS — R7301 Impaired fasting glucose: Secondary | ICD-10-CM | POA: Diagnosis not present

## 2016-02-16 DIAGNOSIS — D500 Iron deficiency anemia secondary to blood loss (chronic): Secondary | ICD-10-CM | POA: Diagnosis not present

## 2016-02-16 DIAGNOSIS — M5442 Lumbago with sciatica, left side: Secondary | ICD-10-CM | POA: Diagnosis not present

## 2016-02-16 DIAGNOSIS — Z13220 Encounter for screening for lipoid disorders: Secondary | ICD-10-CM | POA: Diagnosis not present

## 2016-02-16 DIAGNOSIS — X58XXXD Exposure to other specified factors, subsequent encounter: Secondary | ICD-10-CM | POA: Diagnosis not present

## 2016-02-16 MED ORDER — METHOCARBAMOL 750 MG PO TABS: 750.0000 mg | ORAL_TABLET | Freq: Three times a day (TID) | ORAL | 0 refills | Status: DC | PRN

## 2016-02-16 MED ORDER — FLUCONAZOLE 150 MG PO TABS: ORAL_TABLET | ORAL | 0 refills | Status: DC

## 2016-02-16 MED ORDER — PREDNISONE 20 MG PO TABS: ORAL_TABLET | ORAL | 0 refills | Status: DC

## 2016-02-16 MED ORDER — AZITHROMYCIN 250 MG PO TABS: ORAL_TABLET | ORAL | 0 refills | Status: DC

## 2016-02-17 ENCOUNTER — Encounter: Payer: Self-pay | Admitting: Nurse Practitioner

## 2016-02-17 NOTE — Progress Notes (Signed)
Subjective:  Presents for c/o flare up of her left low back pain. Radiates into lateral left leg to the knee. Began 6 days ago after lifting and moving heavy objects. Minimal relief with Ibuprofen. No numbness or weakness of leg. Requesting her routine labs. Had a needle stick here at work about a year ago. Requesting repeat labs as a precaution.   Objective:   NAD. Alert, oriented. Lungs clear. Heart RRR. Positive left lumbar area tenderness. SLR pos left. Reflexes normal lower extremities. Gait slow but steady.   Assessment:  Problem List Items Addressed This Visit      Other   Anemia - Primary   Relevant Orders   Vitamin B12   Ferritin   Vitamin D deficiency   Relevant Orders   VITAMIN D 25 Hydroxy (Vit-D Deficiency, Fractures)    Other Visit Diagnoses    Impaired fasting glucose       Relevant Orders   Hepatic function panel   Basic metabolic panel   Hemoglobin A1c   Needle exposure, subsequent encounter       Relevant Orders   HIV antibody   Hepatitis C antibody   Hepatitis B Surface AntiGEN   Acute left-sided low back pain with left-sided sciatica       Relevant Medications   predniSONE (DELTASONE) 20 MG tablet   methocarbamol (ROBAXIN) 750 MG tablet   Screening for lipid disorders       Relevant Orders   Lipid panel   Other fatigue       Relevant Orders   Hepatic function panel   Basic metabolic panel   TSH   VITAMIN D 25 Hydroxy (Vit-D Deficiency, Fractures)   Vitamin B12     Plan:  Meds ordered this encounter  Medications  . predniSONE (DELTASONE) 20 MG tablet    Sig: 3 po qd x 3 d then 2 po qd x 3 d then 1 po qd x 3 d    Dispense:  18 tablet    Refill:  0    Order Specific Question:   Supervising Provider    Answer:   Merlyn AlbertLUKING, WILLIAM S [[id number]]  . methocarbamol (ROBAXIN) 750 MG tablet    Sig: Take 1 tablet (750 mg total) by mouth every 8 (eight) hours as needed for muscle spasms.    Dispense:  30 tablet    Refill:  0    Order Specific Question:    Supervising Provider    Answer:   Merlyn AlbertLUKING, WILLIAM S [[id number]]  . azithromycin (ZITHROMAX Z-PAK) 250 MG tablet    Sig: Take 2 tablets (500 mg) on  Day 1,  followed by 1 tablet (250 mg) once daily on Days 2 through 5.    Dispense:  6 each    Refill:  0    Order Specific Question:   Supervising Provider    Answer:   Merlyn AlbertLUKING, WILLIAM S [[id number]]  . fluconazole (DIFLUCAN) 150 MG tablet    Sig: One po qd prn yeast infection; may repeat in 3-4 days if needed    Dispense:  2 tablet    Refill:  0    Order Specific Question:   Supervising Provider    Answer:   Riccardo DubinLUKING, WILLIAM S [[id number]]   Ice/heat applications. Given Zpack and Diflucan per patient request due to upcoming trip to have on hand if needed. Continue physicals and follow up with GYN.

## 2016-02-22 DIAGNOSIS — E559 Vitamin D deficiency, unspecified: Secondary | ICD-10-CM | POA: Diagnosis not present

## 2016-02-22 DIAGNOSIS — R5383 Other fatigue: Secondary | ICD-10-CM | POA: Diagnosis not present

## 2016-02-22 DIAGNOSIS — D500 Iron deficiency anemia secondary to blood loss (chronic): Secondary | ICD-10-CM | POA: Diagnosis not present

## 2016-02-22 DIAGNOSIS — R7301 Impaired fasting glucose: Secondary | ICD-10-CM | POA: Diagnosis not present

## 2016-02-22 DIAGNOSIS — X58XXXD Exposure to other specified factors, subsequent encounter: Secondary | ICD-10-CM | POA: Diagnosis not present

## 2016-02-22 DIAGNOSIS — Z13220 Encounter for screening for lipoid disorders: Secondary | ICD-10-CM | POA: Diagnosis not present

## 2016-02-23 LAB — LIPID PANEL
CHOL/HDL RATIO: 3.6 ratio (ref 0.0–4.4)
Cholesterol, Total: 169 mg/dL (ref 100–199)
HDL: 47 mg/dL (ref >39)
LDL CALC: 97 mg/dL (ref 0–99)
Triglycerides: 123 mg/dL (ref 0–149)
VLDL CHOLESTEROL CAL: 25 mg/dL (ref 5–40)

## 2016-02-23 LAB — HEPATIC FUNCTION PANEL
ALBUMIN: 3.9 g/dL (ref 3.5–5.5)
ALT: 14 IU/L (ref 0–32)
AST: 12 IU/L (ref 0–40)
Alkaline Phosphatase: 70 IU/L (ref 39–117)
Bilirubin Total: 0.3 mg/dL (ref 0.0–1.2)
Bilirubin, Direct: 0.09 mg/dL (ref 0.00–0.40)
TOTAL PROTEIN: 7.1 g/dL (ref 6.0–8.5)

## 2016-02-23 LAB — BASIC METABOLIC PANEL
BUN/Creatinine Ratio: 17 (ref 9–23)
BUN: 11 mg/dL (ref 6–24)
CALCIUM: 9.3 mg/dL (ref 8.7–10.2)
CO2: 25 mmol/L (ref 18–29)
CREATININE: 0.65 mg/dL (ref 0.57–1.00)
Chloride: 101 mmol/L (ref 96–106)
GFR calc Af Amer: 128 mL/min/{1.73_m2} (ref >59)
GFR, EST NON AFRICAN AMERICAN: 111 mL/min/{1.73_m2} (ref >59)
GLUCOSE: 90 mg/dL (ref 65–99)
Potassium: 4.5 mmol/L (ref 3.5–5.2)
Sodium: 139 mmol/L (ref 134–144)

## 2016-02-23 LAB — HEMOGLOBIN A1C
ESTIMATED AVERAGE GLUCOSE: 111 mg/dL
HEMOGLOBIN A1C: 5.5 % (ref 4.8–5.6)

## 2016-02-23 LAB — FERRITIN: Ferritin: 21 ng/mL (ref 15–150)

## 2016-02-23 LAB — VITAMIN B12: VITAMIN B 12: 348 pg/mL (ref 211–946)

## 2016-02-23 LAB — HIV ANTIBODY (ROUTINE TESTING W REFLEX): HIV SCREEN 4TH GENERATION: NONREACTIVE

## 2016-02-23 LAB — HEPATITIS C ANTIBODY: Hep C Virus Ab: <0.1 s/co ratio (ref 0.0–0.9)

## 2016-02-23 LAB — VITAMIN D 25 HYDROXY (VIT D DEFICIENCY, FRACTURES): Vit D, 25-Hydroxy: 19 ng/mL — ABNORMAL LOW (ref 30.0–100.0)

## 2016-02-23 LAB — TSH: TSH: 3.13 u[IU]/mL (ref 0.450–4.500)

## 2016-02-23 LAB — HEPATITIS B SURFACE ANTIGEN: Hepatitis B Surface Ag: NEGATIVE

## 2016-02-29 ENCOUNTER — Other Ambulatory Visit: Payer: Self-pay | Admitting: Nurse Practitioner

## 2016-02-29 MED ORDER — VITAMIN D (ERGOCALCIFEROL) 1.25 MG (50000 UNIT) PO CAPS: 50000.0000 [IU] | ORAL_CAPSULE | ORAL | 0 refills | Status: DC

## 2016-02-29 MED FILL — VIT D2 1.25 MG (50,000 UNIT: 1.25 MG | 84 days supply | Qty: 12 | Fill #0

## 2016-03-01 ENCOUNTER — Ambulatory Visit (HOSPITAL_COMMUNITY): Payer: 59

## 2016-03-06 MED FILL — FLUCONAZOLE 150 MG TABLET: 150 | 3 days supply | Qty: 2 | Fill #0

## 2016-03-06 MED FILL — AZITHROMYCIN 250 MG TABLET: 250 | 5 days supply | Qty: 6 | Fill #0

## 2016-03-07 DIAGNOSIS — Z3202 Encounter for pregnancy test, result negative: Secondary | ICD-10-CM | POA: Diagnosis not present

## 2016-03-07 DIAGNOSIS — Z30430 Encounter for insertion of intrauterine contraceptive device: Secondary | ICD-10-CM | POA: Diagnosis not present

## 2016-03-20 ENCOUNTER — Ambulatory Visit (HOSPITAL_COMMUNITY): Payer: 59

## 2016-04-04 DIAGNOSIS — Z30431 Encounter for routine checking of intrauterine contraceptive device: Secondary | ICD-10-CM | POA: Diagnosis not present

## 2016-04-18 ENCOUNTER — Ambulatory Visit (HOSPITAL_COMMUNITY)
Admission: RE | Admit: 2016-04-18 | Discharge: 2016-04-18 | Disposition: A | Discharge status: Home / Self Care | Payer: 59 | Source: Ambulatory Visit | Attending: Orthopaedic Surgery | Admitting: Orthopaedic Surgery

## 2016-04-18 DIAGNOSIS — M25462 Effusion, left knee: Secondary | ICD-10-CM | POA: Insufficient documentation

## 2016-04-18 DIAGNOSIS — S83282A Other tear of lateral meniscus, current injury, left knee, initial encounter: Secondary | ICD-10-CM | POA: Diagnosis not present

## 2016-04-18 DIAGNOSIS — X58XXXA Exposure to other specified factors, initial encounter: Secondary | ICD-10-CM | POA: Insufficient documentation

## 2016-04-18 DIAGNOSIS — M659 Synovitis and tenosynovitis, unspecified: Secondary | ICD-10-CM | POA: Diagnosis not present

## 2016-04-18 DIAGNOSIS — M94262 Chondromalacia, left knee: Secondary | ICD-10-CM | POA: Insufficient documentation

## 2016-04-18 DIAGNOSIS — M6752 Plica syndrome, left knee: Secondary | ICD-10-CM | POA: Insufficient documentation

## 2016-04-18 DIAGNOSIS — M25562 Pain in left knee: Secondary | ICD-10-CM | POA: Diagnosis not present

## 2016-05-03 ENCOUNTER — Ambulatory Visit (INDEPENDENT_AMBULATORY_CARE_PROVIDER_SITE_OTHER): Payer: 59 | Admitting: Nurse Practitioner

## 2016-05-03 VITALS — BP 126/84

## 2016-05-03 DIAGNOSIS — E282 Polycystic ovarian syndrome: Secondary | ICD-10-CM

## 2016-05-03 DIAGNOSIS — R609 Edema, unspecified: Secondary | ICD-10-CM | POA: Diagnosis not present

## 2016-05-03 DIAGNOSIS — R000 Tachycardia, unspecified: Secondary | ICD-10-CM

## 2016-05-03 MED ORDER — HYDROCHLOROTHIAZIDE 25 MG PO TABS: 25.0000 mg | ORAL_TABLET | Freq: Every day | ORAL | 5 refills | Status: DC

## 2016-05-03 MED ORDER — PHENTERMINE HCL 37.5 MG PO TABS: 37.5000 mg | ORAL_TABLET | Freq: Every day | ORAL | 2 refills | Status: DC

## 2016-05-03 MED ORDER — HYDROCHLOROTHIAZIDE 25 MG PO TABS: 25.0000 mg | ORAL_TABLET | Freq: Every day | ORAL | 1 refills | Status: DC

## 2016-05-03 MED FILL — HYDROCHLOROTHIAZIDE 25 MG T: 25 | 90 days supply | Qty: 90 | Fill #0

## 2016-05-05 ENCOUNTER — Encounter: Payer: Self-pay | Admitting: Nurse Practitioner

## 2016-05-05 DIAGNOSIS — R609 Edema, unspecified: Secondary | ICD-10-CM | POA: Insufficient documentation

## 2016-05-05 DIAGNOSIS — E282 Polycystic ovarian syndrome: Secondary | ICD-10-CM | POA: Insufficient documentation

## 2016-05-05 DIAGNOSIS — R000 Tachycardia, unspecified: Secondary | ICD-10-CM | POA: Insufficient documentation

## 2016-05-05 NOTE — Progress Notes (Signed)
Subjective:  Presents for routine follow up of PCOS and history of tachycardia. Heart rate stable on Diltiazem. Would like to restart Phentermine. Has taken this in the past without difficulty. Will start with 1/2 tab. Also on Metformin for PCOS. Gets regular preventive health physicals. Has IUD for birth control and to control AUB. No regular exercise. Has some mild swelling in lower feet and legs mainly on days she is on her feet for hours at work. No CP/ischemic type pain or unusual SOB.  Objective:   BP 126/84  NAD. Alert, oriented. Lungs clear. Heart RRR. Rate 96. Lower extremities trace non pitting edema.   Assessment:  Problem List Items Addressed This Visit      Endocrine   PCOS (polycystic ovarian syndrome)     Other   Morbid obesity (HCC)   Relevant Medications   phentermine (ADIPEX-P) 37.5 MG tablet   Peripheral edema   Tachycardia - Primary      Plan:  Meds ordered this encounter  Medications  . DISCONTD: hydrochlorothiazide (HYDRODIURIL) 25 MG tablet    Sig: Take 1 tablet (25 mg total) by mouth daily.    Dispense:  30 tablet    Refill:  5    Order Specific Question:   Supervising Provider    Answer:   Merlyn AlbertLUKING, WILLIAM S [[id number]]  . hydrochlorothiazide (HYDRODIURIL) 25 MG tablet    Sig: Take 1 tablet (25 mg total) by mouth daily.    Dispense:  90 tablet    Refill:  1    Order Specific Question:   Supervising Provider    Answer:   Merlyn AlbertLUKING, WILLIAM S [[id number]]  . phentermine (ADIPEX-P) 37.5 MG tablet    Sig: Take 1 tablet (37.5 mg total) by mouth daily before breakfast.    Dispense:  30 tablet    Refill:  2    Order Specific Question:   Supervising Provider    Answer:   Merlyn AlbertLUKING, WILLIAM S [[id number]]   Trial of HCTZ for edema. Encouraged regular activity and weight loss. Use Phentermine cautiously. DC med if any adverse effects. Follow up before further refills. Return in about 6 months (around 11/01/2016) for recheck.

## 2016-05-07 MED FILL — PHENTERMINE 37.5 MG TABLET: 37.5 | 30 days supply | Qty: 30 | Fill #0

## 2016-05-16 ENCOUNTER — Encounter (INDEPENDENT_AMBULATORY_CARE_PROVIDER_SITE_OTHER): Payer: Self-pay | Admitting: Orthopaedic Surgery

## 2016-05-16 ENCOUNTER — Ambulatory Visit (INDEPENDENT_AMBULATORY_CARE_PROVIDER_SITE_OTHER): Payer: 59 | Admitting: Orthopaedic Surgery

## 2016-05-16 VITALS — BP 123/74 | HR 86 | Ht 69.0 in | Wt 255.0 lb

## 2016-05-16 DIAGNOSIS — M25562 Pain in left knee: Secondary | ICD-10-CM | POA: Diagnosis not present

## 2016-05-16 NOTE — Progress Notes (Signed)
Office Visit Note   Patient: Christina Rodriguez           Date of Birth: 10-29-1975           MRN: [id number] Visit Date: 05/16/2016              Requested by: Merlyn Albert, MD [address] [place], [place] [address] PCP: Lubertha South, MD   Assessment & Plan: Visit Diagnoses:  1. Left knee pain, unspecified chronicity           Chondromalacia left knee  Plan: We discussed exercises that she could work on that would not bother the chondromalacia in her knee. She does not have a meniscal tear that would require operative intervention. She can work on exercise diet and weight loss to improve her knee symptoms. We reviewed images of the MRI gave her copy of the report. If she has increased problems she can return.  Follow-Up Instructions: Return if symptoms worsen or fail to improve.   Orders:  No orders of the defined types were placed in this encounter.  No orders of the defined types were placed in this encounter.     Procedures: No procedures performed   Clinical Data: No additional findings.   Subjective: Chief Complaint  Patient presents with  . Left Knee - Follow-up    Patient returns to review MRI Left knee. She denies any changes.    Review of Systems  Constitutional: Negative for chills and diaphoresis.       Positive for increased BMI of 37.66  HENT: Negative for ear discharge, ear pain and nosebleeds.   Eyes: Negative for discharge and visual disturbance.  Respiratory: Negative for cough, choking and shortness of breath.   Cardiovascular: Negative for chest pain and palpitations.  Gastrointestinal: Negative for abdominal distention and abdominal pain.  Endocrine: Negative for cold intolerance and heat intolerance.  Genitourinary: Negative for flank pain and hematuria.  Musculoskeletal:       Positive for bilateral knee pain. History of plantar fascia problems right foot.  Skin: Negative for rash and wound.  Neurological: Negative for  seizures and speech difficulty.  Hematological: Negative for adenopathy. Does not bruise/bleed easily.  Psychiatric/Behavioral: Negative for agitation and suicidal ideas.     Objective: Vital Signs: BP 123/74   Pulse 86   Ht 5\' 9"  (1.753 m)   Wt 255 lb (115.7 kg)   BMI 37.66 kg/m   Physical Exam  Constitutional: She is oriented to person, place, and time. She appears well-developed.  HENT:  Head: Normocephalic.  Right Ear: External ear normal.  Left Ear: External ear normal.  Eyes: Pupils are equal, round, and reactive to light.  Neck: No tracheal deviation present. No thyromegaly present.  Cardiovascular: Normal rate.   Pulmonary/Chest: Effort normal.  Abdominal: Soft.  Musculoskeletal:  Patient has crepitus with knee range of motion. There is mild effusion. She is walking better after this injection. Distal pulses are intact negative bowstring sign no sciatic notch tenderness. Normal hip range of motion distal pulses are 2+ no pitting edema no venous stasis changes. No inguinal lymphadenopathy.  Neurological: She is alert and oriented to person, place, and time.  Skin: Skin is warm and dry.  Psychiatric: She has a normal mood and affect. Her behavior is normal.    Ortho Exam knee and ankle jerk are 2+ and symmetrical. Positive patellar grind test. Cruciate ligament exam is normal. She has near full extension and flexes passed 100 both knees.  Specialty Comments:  No specialty comments available.  Imaging: No results found. MR Knee Left Wo Contrast (Accession [id number]) (Order [id number])  Imaging  Date: 04/18/2016 Department: [facility] Released By: Ester Rinkelores H Simmons Authorizing: Eldred MangesMark C Armonte Tortorella, MD  Exam Information   Status Exam Begun  Exam Ended   Final [99] 04/18/2016 10:03 AM 04/18/2016 10:28 AM  PACS Images   Show images for MR Knee Left Wo Contrast  Study Result   CLINICAL DATA:  Left knee pain for 3 months.  EXAM: MRI OF THE LEFT KNEE WITHOUT  CONTRAST  TECHNIQUE: Multiplanar, multisequence MR imaging of the knee was performed. No intravenous contrast was administered.  COMPARISON:  Radiographs 08/24/2015  FINDINGS: MENISCI  Medial meniscus:  Intact.  Mild intrasubstance degenerative changes.  Lateral meniscus: Small free edge tear involving the mid body region but no other significant findings.  LIGAMENTS  Cruciates:  Intact  Collaterals:  Intact  CARTILAGE  Patellofemoral: Mild to moderate chondromalacia patella along the patellar apex for the patient's age. No full-thickness cartilage defects or osteochondral abnormality.  Medial:  Mild degenerative chondrosis/ chondromalacia.  Lateral: Mild to moderate degenerative chondrosis. There is a 6 mm focal partial-thickness defect involving the lateral femoral condyle articular cartilage.  Joint: Moderate-sized joint effusion and mild synovitis. Superior and medial patellar plica are noted.  Popliteal Fossa:  No popliteal mass or Baker's cyst.  Extensor Mechanism: The patella retinacular structures are intact and the quadriceps and patellar tendons are intact.  Bones:  No acute bony findings.  Other: The knee musculature appears normal.  IMPRESSION: 1. Intact ligamentous structures and no acute bony findings. 2. Small free edge tear involving the mid body region of the lateral meniscus. 3. Tricompartmental mild degenerative chondrosis/chondromalacia as discussed above. 6 mm partial-thickness cartilage defect involving the lateral femoral condyle. 4. Moderate-sized joint effusion and mild synovitis. Superior and medial patellar plica are noted.   Electronically Signed   By: Rudie MeyerP.  Gallerani M.D.        PMFS History: Patient Active Problem List   Diagnosis Date Noted  . PCOS (polycystic ovarian syndrome) 05/05/2016  . Morbid obesity (HCC) 05/05/2016  . Tachycardia 05/05/2016  . Peripheral edema 05/05/2016  . Anemia 01/23/2015   . Vitamin D deficiency 01/23/2015  . Plantar fasciitis of right foot 09/17/2013  . Migraine headache with aura 09/03/2013  . Asthma with acute exacerbation 07/19/2013   Past Medical History:  Diagnosis Date  . Anemia    HIstory of   . Asthma    albuterol inhaler prn-recent uri-using more freq  . Dysrhythmia    history of svt-controlled on cardizem-diagnosed in 1999-  . GERD (gastroesophageal reflux disease)    uses protonix  . Headache    migraines   . History of polycystic ovarian disease   . PONV (postoperative nausea and vomiting)   . Recurrent upper respiratory infection (URI)    resolving  . VSD (ventricular septal defect)    history of -no problems    No family history on file.  Past Surgical History:  Procedure Laterality Date  . CESAREAN SECTION  2010  . CESAREAN SECTION  11/16/2010   Procedure: CESAREAN SECTION;  Surgeon: Almon HerculesKendra H. Ross;  Location: [facility];  Service: Gynecology;  Laterality: N/A;  Repeat   . CHOLECYSTECTOMY  2003  . DILATION AND CURETTAGE OF UTERUS    . HYSTEROSCOPY W/D&C N/A 12/06/2014   Procedure: DILATATION AND CURETTAGE /HYSTEROSCOPY;  Surgeon: [name], MD;  Location: [facility];  Service: Gynecology;  Laterality: N/A;  . uterine polyps     Social History   Occupational History  . Not on file.   Social History Main Topics  . Smoking status: Never Smoker  . Smokeless tobacco: Never Used  . Alcohol use No  . Drug use: No  . Sexual activity: Not on file

## 2016-05-25 ENCOUNTER — Encounter (INDEPENDENT_AMBULATORY_CARE_PROVIDER_SITE_OTHER): Payer: 59 | Admitting: Family Medicine

## 2016-05-31 ENCOUNTER — Telehealth: Payer: Self-pay | Admitting: *Deleted

## 2016-05-31 ENCOUNTER — Other Ambulatory Visit: Payer: Self-pay | Admitting: Nurse Practitioner

## 2016-05-31 MED ORDER — AZITHROMYCIN 250 MG PO TABS: ORAL_TABLET | ORAL | 0 refills | Status: DC

## 2016-05-31 NOTE — Telephone Encounter (Signed)
Ok. [facility]. [facility]. Text me over the weekend if any problems.

## 2016-05-31 NOTE — Telephone Encounter (Signed)
Pt.notified

## 2016-05-31 NOTE — Telephone Encounter (Signed)
Pt did strep test at work yesteday and pt coughed and srayed in pt's face. Strep test was positive. Pt having a scratchy throat today. Can she have a zpack sent to pharm in case she needs this weekend. She wont pickup unless she needs it. [facility] [place].

## 2016-06-19 ENCOUNTER — Encounter: Payer: 59 | Attending: Nurse Practitioner | Admitting: Nutrition

## 2016-06-19 VITALS — Ht 68.0 in | Wt 271.0 lb

## 2016-06-19 DIAGNOSIS — E669 Obesity, unspecified: Secondary | ICD-10-CM

## 2016-06-19 DIAGNOSIS — E282 Polycystic ovarian syndrome: Secondary | ICD-10-CM

## 2016-06-19 DIAGNOSIS — Z713 Dietary counseling and surveillance: Secondary | ICD-10-CM | POA: Diagnosis not present

## 2016-06-19 NOTE — Patient Instructions (Signed)
Goals 1. Follow My Plate 2. Cut back on carbs to 2-3 per meal 3. Increase low carb vegetables 2-3 per meal 4. Cut out snacks 5. Walk 30 minutes three days per week 6. Cut out diet cokes. 7. Lose 1-2 lbs per week. Keep food journal -or use My Fitness Automatic DataPall

## 2016-06-19 NOTE — Progress Notes (Signed)
  Medical Nutrition Therapy:  Appt start time: 1500  end time:  1600  Assessment:  Primary concerns today: Obesity. Lives with her husband and family. [place] at [employer]. Wants to lose weight. Eats 2-3 meals per day. Diet tends to be higher fat, higher salt and low in fresh fruit and lower carb vegetables. Not much exercise right now. Her husband is going to be going thru bariatric surgery and so they want to lose weigh together. She is not taking the Phentermine right now.  Diet is excessive to meet her needs presently due to obesity with BMI of 41. A1C WNL 5.5%. On Metformin 1000 mg per day for PCOS.  Marland Kitchen. Preferred Learning Style:   No preference indicated   Learning Readiness:   Ready  Change in progress   MEDICATIONS:    DIETARY INTAKE  24-hr recall:  B ( AM): Egg sandwich or bologna and cheese sandwich, Diet coke  Snk ( AM):  L ( PM): Vegetable soup with 1 cup of rice, water Snk ( PM): D ( PM )  Chicken alfredo with grilled chicken, 1 cup,  Grape juice 8 oz Snk ( PM):   Beverages: water,   Usual physical activity: Some but not a lot outside of work.   Estimated energy needs: 1500 calories 170 g carbohydrates 112 g protein 42 g fat  Progress Towards Goal(s):  In progress.   Nutritional Diagnosis:  NB-1.1 Food and nutrition-related knowledge deficit As related to Obesity.  As evidenced by BMI >40.     Intervention:  Low fat low sodium high fiber diet using the Plate Method, carb counting due to PCOS, portion sizes, benefits of exercise for needed weight loss, timing of meals and balanced meals. Healthy weight loss tips..   Goals 1. Follow My Plate 2. Cut back on carbs to 2-3 per meal 3. Increase low carb vegetables 2-3 per meal 4. Cut out snacks 5. Walk 30 minutes three days per week 6. Cut out diet cokes. 7. Lose 1-2 lbs per week. Keep food journal -or use My Fitness Automatic DataPall   Teaching Method Utilized:  Visual Auditory Hands on  Handouts given  during visit include:  The Plate Method  Meal Plan Card  Barriers to learning/adherence to lifestyle change: None  Demonstrated degree of understanding via:  Teach Back   Monitoring/Evaluation:  Dietary intake, exercise, meal planning, and body weight in 1 month(s).

## 2016-07-18 ENCOUNTER — Ambulatory Visit: Payer: 59 | Admitting: Nutrition

## 2016-08-21 ENCOUNTER — Ambulatory Visit: Payer: 59 | Admitting: Nutrition

## 2016-10-11 ENCOUNTER — Other Ambulatory Visit: Payer: Self-pay | Admitting: Nurse Practitioner

## 2016-10-11 MED ORDER — AZITHROMYCIN 250 MG PO TABS: ORAL_TABLET | ORAL | 0 refills | Status: DC

## 2016-10-11 MED ORDER — ONDANSETRON 8 MG PO TBDP: 8.0000 mg | ORAL_TABLET | Freq: Three times a day (TID) | ORAL | 0 refills | Status: DC | PRN

## 2016-10-11 MED ORDER — FLUCONAZOLE 150 MG PO TABS
ORAL_TABLET | ORAL | 0 refills | Status: DC
Start: 2016-10-11 — End: 2016-11-07

## 2016-10-11 MED FILL — PHENTERMINE 37.5 MG TABLET: 37.5 | 30 days supply | Qty: 30 | Fill #1

## 2016-11-07 ENCOUNTER — Other Ambulatory Visit: Payer: Self-pay | Admitting: Nurse Practitioner

## 2016-11-07 MED ORDER — FLUCONAZOLE 150 MG PO TABS: ORAL_TABLET | ORAL | 0 refills | Status: DC

## 2016-11-20 ENCOUNTER — Encounter: Payer: Self-pay | Admitting: Nurse Practitioner

## 2016-11-20 ENCOUNTER — Ambulatory Visit (INDEPENDENT_AMBULATORY_CARE_PROVIDER_SITE_OTHER): Payer: 59 | Admitting: Nurse Practitioner

## 2016-11-20 VITALS — BP 118/86 | Ht 67.0 in

## 2016-11-20 DIAGNOSIS — E8881 Metabolic syndrome: Secondary | ICD-10-CM

## 2016-11-20 DIAGNOSIS — E282 Polycystic ovarian syndrome: Secondary | ICD-10-CM

## 2016-11-20 DIAGNOSIS — E161 Other hypoglycemia: Secondary | ICD-10-CM | POA: Diagnosis not present

## 2016-11-20 MED ORDER — LIRAGLUTIDE 18 MG/3ML ~~LOC~~ SOPN: PEN_INJECTOR | SUBCUTANEOUS | 2 refills | Status: DC

## 2016-11-20 MED ORDER — PEN NEEDLES 32G X 5 MM MISC: 1.2000 mg | Freq: Every day | 0 refills | Status: DC

## 2016-11-20 MED ORDER — METFORMIN HCL ER 500 MG PO TB24: 1000.0000 mg | ORAL_TABLET | Freq: Every day | ORAL | 1 refills | Status: DC

## 2016-11-20 MED FILL — NOVOTWIST 32G X 5 MM MISC: 32G X 5 MM | 90 days supply | Qty: 100 | Fill #0

## 2016-11-20 MED FILL — METFORMIN HCL ER 500 MG TAB: 500 | 90 days supply | Qty: 180 | Fill #0

## 2016-11-21 ENCOUNTER — Encounter: Payer: Self-pay | Admitting: Nurse Practitioner

## 2016-11-21 NOTE — Progress Notes (Signed)
Subjective:  Presents to discuss her PCOS and metabolic syndrome. Patient would like to add on Victoza to help with her insulin level and weight. Has been on the metformin long-term with minimal improvement. Has been working very hard lately to lose weight. Minimal exercise. No personal history of pancreatitis. No family history of thyroid cancer or MENS.  Objective:   BP 118/86   Ht 5\' 7"  (1.702 m)  NAD. Alert, oriented. Lungs clear. Heart regular rate rhythm.  Assessment:   Problem List Items Addressed This Visit      Digestive   Hyperinsulinemia     Endocrine   PCOS (polycystic ovarian syndrome) - Primary     Other   Metabolic syndrome       Plan:   Meds ordered this encounter  Medications  . metFORMIN (GLUCOPHAGE-XR) 500 MG 24 hr tablet    Sig: Take 2 tablets (1,000 mg total) by mouth daily with breakfast.    Dispense:  180 tablet    Refill:  1    Order Specific Question:   Supervising Provider    Answer:   Merlyn AlbertLUKING, WILLIAM S [[id number]]  . liraglutide (VICTOZA) 18 MG/3ML SOPN    Sig: Start with 0.6 mg [place] qd x 1 week then 1.2 mg subcu qd    Dispense:  1 pen    Refill:  2    Order Specific Question:   Supervising Provider    Answer:   Merlyn AlbertLUKING, WILLIAM S [[id number]]  . Insulin Pen Needle (PEN NEEDLES) 32G X 5 MM MISC    Sig: 1.2 mg by Does not apply route daily.    Dispense:  100 each    Refill:  0    Patient has a coupon for free box of [employer] pen needles    Order Specific Question:   Supervising Provider    Answer:   Merlyn AlbertLUKING, WILLIAM S [[id number]]   Trial of Victoza. Cautioned about potential adverse effects. DC med and contact office if any problems. Encouraged regular activity. Continue weight loss efforts.Continue metformin as directed. Return in about 6 months (around 05/23/2017) for recheck.

## 2016-11-22 MED FILL — VICTOZA 2-PAK 18 MG/3 ML PE: 18 | 30 days supply | Qty: 6 | Fill #0

## 2016-11-25 ENCOUNTER — Telehealth: Payer: Self-pay | Admitting: Family Medicine

## 2016-11-25 NOTE — Telephone Encounter (Signed)
Rx prior auth APPROVED for liraglutide (VICTOZA) 18 MG/3ML SOPN Auth is valid for a maximum 12 fills from 11/21/16-11/20/17  Pt & pharmacy aware

## 2016-11-28 ENCOUNTER — Other Ambulatory Visit: Payer: Self-pay | Admitting: Obstetrics and Gynecology

## 2016-11-28 ENCOUNTER — Encounter (INDEPENDENT_AMBULATORY_CARE_PROVIDER_SITE_OTHER): Payer: 59 | Admitting: Family Medicine

## 2016-11-28 DIAGNOSIS — Z1231 Encounter for screening mammogram for malignant neoplasm of breast: Secondary | ICD-10-CM

## 2016-12-18 MED FILL — VICTOZA 2-PAK 18 MG/3 ML PE: 18 | 30 days supply | Qty: 6 | Fill #1

## 2016-12-26 ENCOUNTER — Ambulatory Visit (INDEPENDENT_AMBULATORY_CARE_PROVIDER_SITE_OTHER): Payer: 59 | Admitting: Family Medicine

## 2016-12-26 ENCOUNTER — Encounter (INDEPENDENT_AMBULATORY_CARE_PROVIDER_SITE_OTHER): Payer: Self-pay | Admitting: Family Medicine

## 2016-12-26 VITALS — BP 112/75 | HR 80 | Temp 99.0°F | Resp 10 | Ht 67.0 in | Wt 260.0 lb

## 2016-12-26 DIAGNOSIS — R7303 Prediabetes: Secondary | ICD-10-CM | POA: Insufficient documentation

## 2016-12-26 DIAGNOSIS — R0602 Shortness of breath: Secondary | ICD-10-CM

## 2016-12-26 DIAGNOSIS — IMO0001 Reserved for inherently not codable concepts without codable children: Secondary | ICD-10-CM

## 2016-12-26 DIAGNOSIS — R5383 Other fatigue: Secondary | ICD-10-CM

## 2016-12-26 DIAGNOSIS — Z6841 Body Mass Index (BMI) 40.0 and over, adult: Secondary | ICD-10-CM

## 2016-12-26 DIAGNOSIS — Z1389 Encounter for screening for other disorder: Secondary | ICD-10-CM | POA: Diagnosis not present

## 2016-12-26 DIAGNOSIS — Z9189 Other specified personal risk factors, not elsewhere classified: Secondary | ICD-10-CM | POA: Diagnosis not present

## 2016-12-26 DIAGNOSIS — E669 Obesity, unspecified: Secondary | ICD-10-CM

## 2016-12-26 DIAGNOSIS — Z0289 Encounter for other administrative examinations: Secondary | ICD-10-CM

## 2016-12-26 DIAGNOSIS — Z1331 Encounter for screening for depression: Secondary | ICD-10-CM

## 2016-12-26 NOTE — Progress Notes (Signed)
Office: [phone]  /  Fax: [phone]   Dear [name] C. Isabell Jarvis, NP,   Thank you for referring TORIANA SPONSEL to our clinic. The following note includes my evaluation and treatment recommendations.  HPI:   Chief Complaint: OBESITY    Lashelle KYNZLEY DOWSON has been referred by Presley Raddle. Isabell Jarvis, NP for consultation regarding her obesity and obesity related comorbidities.    ASPEN LAWRANCE (MR# [id number]) is a 41 y.o. female who presents on 12/26/2016 for obesity evaluation and treatment. Current BMI is Body mass index is 40.72 kg/m.Marland Kitchen Mazie has been struggling with her weight for many years and has been unsuccessful in either losing weight, maintaining weight loss, or reaching her healthy weight goal.     Resa attended our information session and states she is currently in the action stage of change and ready to dedicate time achieving and maintaining a healthier weight. Alyssandra is interested in becoming our patient and working on intensive lifestyle modifications including (but not limited to) diet, exercise and weight loss.    Kaylana states her family eats meals together she thinks her family will eat healthier with  her her desired weight loss is 85 lbs she has been heavy most of  her life she started gaining weight when trying to get pregnant and through pregnancy her heaviest weight ever was 275 lbs. she has significant food cravings issues  she is frequently drinking liquids with calories she frequently makes poor food choices she frequently eats larger portions than normal    Fatigue Dnasia feels her energy is lower than it should be. This has worsened with weight gain and has not worsened recently. Renesha admits to daytime somnolence and  denies waking up still tired. Patient is at risk for obstructive sleep apnea. Patent has a history of symptoms of daytime fatigue and morning headache. Patient generally gets 7 hours of sleep per night, and states they generally have restful  sleep. Snoring is present. Apneic episodes are not present. Epworth Sleepiness Score is 4  Dyspnea on exertion Ashley notes increasing shortness of breath with exercising and with a history of asthma (mild persistent). She's had no recent exacerbations. This seems to be worsening over time with weight gain and she notes getting out of breath sooner with activity than she used to. This has not gotten worse recently. Jataya denies orthopnea.  Pre-Diabetes Kienna has a diagnosis of pre-diabetes based on her elevated Hgb A1c and was informed this puts her at greater risk of developing diabetes. She was put on victoza and notes no decrease in BGs and did note decreased polyphagia. She is also taking metformin XR 1,000 mg qd and is attempting to work on diet and exercise to decrease risk of diabetes. She denies nausea or hypoglycemia.  At risk for diabetes Jaidin is at higher than average risk for developing diabetes due to her obesity and pre-diabetes. She currently denies polyuria or polydipsia.   Depression Screen Pegge's Food and Mood (modified PHQ-9) score was  Depression screen [facility] 2/9 12/26/2016  Decreased Interest 0  Down, Depressed, Hopeless 0  PHQ - 2 Score 0  Altered sleeping 0  Tired, decreased energy 1  Change in appetite 0  Feeling bad or failure about yourself  0  Trouble concentrating 0  Moving slowly or fidgety/restless 0  Suicidal thoughts 0  PHQ-9 Score 1  Difficult doing work/chores Not difficult at all    ALLERGIES: Allergies  Allergen Reactions  . Beta Adrenergic Blockers Shortness Of Breath  .  Shrimp [Shellfish Allergy] Hives  . Soybeans Hives    MEDICATIONS: Current Outpatient Prescriptions on File Prior to Visit  Medication Sig Dispense Refill  . albuterol (PROVENTIL HFA;VENTOLIN HFA) 108 (90 BASE) MCG/ACT inhaler Inhale 2 puffs into the lungs every 4 (four) hours as needed for wheezing or shortness of breath. 1 Inhaler 5  . diltiazem (CARDIZEM CD) 240 MG  24 hr capsule Take 1 capsule (240 mg total) by mouth daily. 90 capsule 1  . EPINEPHrine 0.3 mg/0.3 mL IJ SOAJ injection Inject 0.3 mg into the muscle once. Reported on 05/03/2015    . Fluticasone-Salmeterol (ADVAIR DISKUS) 250-50 MCG/DOSE AEPB Inhale 1 puff into the lungs 2 (two) times daily.      . hydrochlorothiazide (HYDRODIURIL) 25 MG tablet Take 1 tablet (25 mg total) by mouth daily. 90 tablet 1  . ibuprofen (ADVIL,MOTRIN) 200 MG tablet Take 400 mg by mouth every 6 (six) hours as needed for headache, mild pain or moderate pain.    . Insulin Pen Needle (PEN NEEDLES) 32G X 5 MM MISC 1.2 mg by Does not apply route daily. 100 each 0  . liraglutide (VICTOZA) 18 MG/3ML SOPN Start with 0.6 mg [place] qd x 1 week then 1.2 mg subcu qd 1 pen 2  . metFORMIN (GLUCOPHAGE-XR) 500 MG 24 hr tablet Take 2 tablets (1,000 mg total) by mouth daily with breakfast. 180 tablet 1  . Multiple Vitamins-Minerals (MULTIVITAMIN ADULT PO) Take 1 tablet by mouth daily.    . pantoprazole (PROTONIX) 40 MG tablet Take 1 tablet (40 mg total) by mouth daily. 90 tablet 1  . rizatriptan (MAXALT-MLT) 10 MG disintegrating tablet Take 1 tablet (10 mg total) by mouth as needed for migraine. May repeat in 2 hours if needed; max 2 per 24 hours 27 tablet 1   No current facility-administered medications on file prior to visit.     PAST MEDICAL HISTORY: Past Medical History:  Diagnosis Date  . Adenomyosis   . Anemia    HIstory of   . Asthma    albuterol inhaler prn-recent uri-using more freq  . Dysrhythmia    history of svt-controlled on cardizem-diagnosed in 1999-  . Gall bladder disease   . GERD (gastroesophageal reflux disease)    uses protonix  . Headache    migraines   . History of polycystic ovarian disease   . Infertility associated with anovulation   . Metabolic syndrome   . Multiple food allergies    shrimp  . Osteoarthritis   . Palpitations   . PCOS (polycystic ovarian syndrome)   . PONV (postoperative nausea and  vomiting)   . Prediabetes   . Recurrent upper respiratory infection (URI)    resolving  . SVT (supraventricular tachycardia) (HCC)    History of   . Vitamin D deficiency   . VSD (ventricular septal defect)    history of -no problems    PAST SURGICAL HISTORY: Past Surgical History:  Procedure Laterality Date  . CESAREAN SECTION  2010  . CESAREAN SECTION  11/16/2010   Procedure: CESAREAN SECTION;  Surgeon: Almon Hercules;  Location: [facility];  Service: Gynecology;  Laterality: N/A;  Repeat   . CHOLECYSTECTOMY  2003  . DILATION AND CURETTAGE OF UTERUS    . HYSTEROSCOPY W/D&C N/A 12/06/2014   Procedure: DILATATION AND CURETTAGE /HYSTEROSCOPY;  Surgeon: Waynard Reeds, MD;  Location: [facility];  Service: Gynecology;  Laterality: N/A;  . uterine polyps      SOCIAL HISTORY: Social History  Substance Use  Topics  . Smoking status: Never Smoker  . Smokeless tobacco: Never Used  . Alcohol use No    FAMILY HISTORY: Family History  Problem Relation Age of Onset  . Diabetes Father   . Hypertension Father   . Hyperlipidemia Father   . Sleep apnea Father   . Obesity Father     ROS: Review of Systems  Constitutional: Positive for malaise/fatigue.  Eyes:       Wear Glasses or Contacts Floaters (with migraine)   Respiratory: Positive for shortness of breath (with exercise).   Cardiovascular: Negative for orthopnea.  Gastrointestinal: Positive for heartburn. Negative for nausea.  Genitourinary: Negative for frequency.  Neurological: Positive for headaches.  Endo/Heme/Allergies: Negative for polydipsia.       Polyphagia Negative hypoglycemia    PHYSICAL EXAM: Blood pressure 112/75, pulse 80, temperature 99 F (37.2 C), temperature source Oral, resp. rate 10, height 5\' 7"  (1.702 m), weight 260 lb (117.9 kg), SpO2 100 %. Body mass index is 40.72 kg/m. Physical Exam  Constitutional: She is oriented to person, place, and time. She appears well-developed and well-nourished.    Cardiovascular: Normal rate.   Murmur (early systolic murmur) heard. Pulmonary/Chest: Effort normal.  Musculoskeletal: Normal range of motion.  Neurological: She is oriented to person, place, and time.  Skin: Skin is warm and dry.  Psychiatric: She has a normal mood and affect. Her behavior is normal.  Vitals reviewed.   RECENT LABS AND TESTS: BMET    Component Value Date/Time   NA 139 02/22/2016 0823   K 4.5 02/22/2016 0823   CL 101 02/22/2016 0823   CO2 25 02/22/2016 0823   GLUCOSE 90 02/22/2016 0823   GLUCOSE 127 (H) 11/16/2014 1625   BUN 11 02/22/2016 0823   CREATININE 0.65 02/22/2016 0823   CREATININE 0.62 01/20/2014 1317   CALCIUM 9.3 02/22/2016 0823   GFRNONAA 111 02/22/2016 0823   GFRAA 128 02/22/2016 0823   Lab Results  Component Value Date   HGBA1C 5.5 02/22/2016   No results found for: INSULIN CBC    Component Value Date/Time   WBC 7.2 01/26/2015 0823   WBC 8.9 11/16/2014 1625   RBC 4.52 01/26/2015 0823   RBC 4.34 11/16/2014 1625   HGB 12.5 01/26/2015 0823   HCT 38.9 01/26/2015 0823   PLT 389 (H) 01/26/2015 0823   MCV 86 01/26/2015 0823   MCH 27.7 01/26/2015 0823   MCH 27.0 11/16/2014 1625   MCHC 32.1 01/26/2015 0823   MCHC 31.8 11/16/2014 1625   RDW 15.7 (H) 01/26/2015 0823   LYMPHSABS 1.9 01/26/2015 0823   EOSABS 0.2 01/26/2015 0823   BASOSABS 0.0 01/26/2015 0823   Iron/TIBC/Ferritin/ %Sat    Component Value Date/Time   FERRITIN 21 02/22/2016 0823   Lipid Panel     Component Value Date/Time   CHOL 169 02/22/2016 0823   TRIG 123 02/22/2016 0823   HDL 47 02/22/2016 0823   CHOLHDL 3.6 02/22/2016 0823   CHOLHDL 4.6 01/20/2014 1317   VLDL 22 01/20/2014 1317   LDLCALC 97 02/22/2016 0823   Hepatic Function Panel     Component Value Date/Time   PROT 7.1 02/22/2016 0823   ALBUMIN 3.9 02/22/2016 0823   AST 12 02/22/2016 0823   ALT 14 02/22/2016 0823   ALKPHOS 70 02/22/2016 0823   BILITOT 0.3 02/22/2016 0823   BILIDIR 0.09 02/22/2016  0823   IBILI 0.2 01/20/2014 1317      Component Value Date/Time   TSH 3.130 02/22/2016 1601  TSH 2.880 01/26/2015 0823   TSH 2.877 01/20/2014 1317    ECG  shows NSR with a rate of 84 BPM INDIRECT CALORIMETER done today shows a VO2 of 293 and a REE of 2031.  Her calculated basal metabolic rate is 1610 thus her basal metabolic rate is better than expected.    ASSESSMENT AND PLAN: Other fatigue - Plan: EKG 12-Lead, Vitamin B12, CBC With Differential, Folate, Lipid Panel With LDL/HDL Ratio, T3, T4, free, TSH, VITAMIN D 25 Hydroxy (Vit-D Deficiency, Fractures)  Shortness of breath on exertion - with a history of mild persistent asthma (0 recent exacerdations)  Prediabetes - Plan: Comprehensive metabolic panel, Hemoglobin A1c, Insulin, random  Depression screening  At risk for diabetes mellitus  Class 3 obesity with serious comorbidity and body mass index (BMI) of 40.0 to 44.9 in adult, unspecified obesity type [facility])  PLAN: Fatigue Umaima was informed that her fatigue may be related to obesity, depression or many other causes. Labs will be ordered, and in the meanwhile Patches has agreed to work on diet, exercise and weight loss to help with fatigue. Proper sleep hygiene was discussed including the need for 7-8 hours of quality sleep each night. A sleep study was not ordered based on symptoms and Epworth score.  Dyspnea on exertion Nery's shortness of breath appears to be obesity related and exercise induced. She has agreed to work on weight loss and gradually increase exercise to treat her exercise induced shortness of breath. If Severina follows our instructions and loses weight without improvement of her shortness of breath, we will plan to refer to pulmonology. We will monitor this condition regularly. Shiva agrees to this plan.  Pre-Diabetes Genice will start to work on weight loss, exercise, and decreasing simple carbohydrates in her diet to help decrease the risk of diabetes.  We dicussed metformin including benefits and risks. She was informed that eating too many simple carbohydrates or too many calories at one sitting increases the likelihood of GI side effects. We will check labs and Kariah agrees to continue metformin and victoza as prescribed. Jakeline agreed to follow up with [place] as directed to monitor her progress.  Diabetes risk counselling Lyliana was given extended (15 minutes) diabetes prevention counseling today. She is 41 y.o. female and has risk factors for diabetes including obesity and pre-diabetes. We discussed intensive lifestyle modifications today with an emphasis on weight loss as well as increasing exercise and decreasing simple carbohydrates in her diet.  Depression Screen Para had a negative depression screening. Depression is commonly associated with obesity and often results in emotional eating behaviors. We will monitor this closely and work on CBT to help improve the non-hunger eating patterns. Referral to Psychology may be required if no improvement is seen as she continues in our clinic.  Obesity Avanell is currently in the action stage of change and her goal is to continue with weight loss efforts. I recommend Teighlor begin the structured treatment plan as follows:  She has agreed to follow the Category 3 plan Leathia has been instructed to eventually work up to a goal of 150 minutes of combined cardio and strengthening exercise per week for weight loss and overall health benefits. We discussed the following Behavioral Modification Strategies today: meal planning & cooking strategies, increasing lean protein intake and decreasing simple carbohydrates    She was informed of the importance of frequent follow up visits to maximize her success with intensive lifestyle modifications for her multiple health conditions. She was informed we would  discuss her lab results at her next visit unless there is a critical issue that needs to be addressed sooner.  Danniell agreed to keep her next visit at the agreed upon time to discuss these results.  I, [place] Crane, am acting as transcriptionist for Quillian Quince, MD  I have reviewed the above documentation for accuracy and completeness, and I agree with the above. -Quillian Quince, MD   OBESITY BEHAVIORAL INTERVENTION VISIT  Today's visit was # 1 out of 22.  Starting weight: 260 lbs Starting date: 12/26/16 Today's weight : 260 lbs Today's date: 12/26/2016 Total lbs lost to date: 0 (Patients must lose 7 lbs in the first 6 months to continue with counseling)   ASK: We discussed the diagnosis of obesity with Iyauna S Swart today and Lotus agreed to give [place] permission to discuss obesity behavioral modification therapy today.  ASSESS: Khadeja has the diagnosis of obesity and her BMI today is 40.71 Sanela is in the action stage of change   ADVISE: See was educated on the multiple health risks of obesity as well as the benefit of weight loss to improve her health. She was advised of the need for long term treatment and the importance of lifestyle modifications.  AGREE: Multiple dietary modification options and treatment options were discussed and  Rikayla agreed to follow the Category 3 plan We discussed the following Behavioral Modification Strategies today: meal planning & cooking strategies, increasing lean protein intake and decreasing simple carbohydrates

## 2016-12-27 LAB — COMPREHENSIVE METABOLIC PANEL
A/G RATIO: 1.4 (ref 1.2–2.2)
ALT: 33 IU/L — AB (ref 0–32)
AST: 19 IU/L (ref 0–40)
Albumin: 4.2 g/dL (ref 3.5–5.5)
Alkaline Phosphatase: 76 IU/L (ref 39–117)
BILIRUBIN TOTAL: 0.4 mg/dL (ref 0.0–1.2)
BUN / CREAT RATIO: 14 (ref 9–23)
BUN: 10 mg/dL (ref 6–24)
CHLORIDE: 105 mmol/L (ref 96–106)
CO2: 21 mmol/L (ref 20–29)
Calcium: 9.5 mg/dL (ref 8.7–10.2)
Creatinine, Ser: 0.7 mg/dL (ref 0.57–1.00)
GFR calc Af Amer: 125 mL/min/{1.73_m2} (ref >59)
GFR, EST NON AFRICAN AMERICAN: 109 mL/min/{1.73_m2} (ref >59)
GLOBULIN, TOTAL: 3.1 g/dL (ref 1.5–4.5)
Glucose: 86 mg/dL (ref 65–99)
POTASSIUM: 4.5 mmol/L (ref 3.5–5.2)
SODIUM: 139 mmol/L (ref 134–144)
TOTAL PROTEIN: 7.3 g/dL (ref 6.0–8.5)

## 2016-12-27 LAB — CBC WITH DIFFERENTIAL
BASOS: 0 %
Basophils Absolute: 0 10*3/uL (ref 0.0–0.2)
EOS (ABSOLUTE): 0.1 10*3/uL (ref 0.0–0.4)
Eos: 1 %
HEMATOCRIT: 39.7 % (ref 34.0–46.6)
Hemoglobin: 12.9 g/dL (ref 11.1–15.9)
IMMATURE GRANS (ABS): 0 10*3/uL (ref 0.0–0.1)
Immature Granulocytes: 0 %
Lymphocytes Absolute: 1.7 10*3/uL (ref 0.7–3.1)
Lymphs: 25 %
MCH: 27.8 pg (ref 26.6–33.0)
MCHC: 32.5 g/dL (ref 31.5–35.7)
MCV: 86 fL (ref 79–97)
MONOS ABS: 0.4 10*3/uL (ref 0.1–0.9)
Monocytes: 6 %
NEUTROS ABS: 4.5 10*3/uL (ref 1.4–7.0)
NEUTROS PCT: 68 %
RBC: 4.64 x10E6/uL (ref 3.77–5.28)
RDW: 15.2 % (ref 12.3–15.4)
WBC: 6.7 10*3/uL (ref 3.4–10.8)

## 2016-12-27 LAB — LIPID PANEL WITH LDL/HDL RATIO
Cholesterol, Total: 137 mg/dL (ref 100–199)
HDL: 33 mg/dL — AB (ref >39)
LDL CALC: 94 mg/dL (ref 0–99)
LDL/HDL RATIO: 2.8 ratio (ref 0.0–3.2)
TRIGLYCERIDES: 51 mg/dL (ref 0–149)
VLDL CHOLESTEROL CAL: 10 mg/dL (ref 5–40)

## 2016-12-27 LAB — T4, FREE: Free T4: 1.16 ng/dL (ref 0.82–1.77)

## 2016-12-27 LAB — VITAMIN B12: VITAMIN B 12: 904 pg/mL (ref 232–1245)

## 2016-12-27 LAB — HEMOGLOBIN A1C
Est. average glucose Bld gHb Est-mCnc: 105 mg/dL
Hgb A1c MFr Bld: 5.3 % (ref 4.8–5.6)

## 2016-12-27 LAB — TSH: TSH: 2.22 u[IU]/mL (ref 0.450–4.500)

## 2016-12-27 LAB — VITAMIN D 25 HYDROXY (VIT D DEFICIENCY, FRACTURES): Vit D, 25-Hydroxy: 32 ng/mL (ref 30.0–100.0)

## 2016-12-27 LAB — FOLATE: FOLATE: 16.7 ng/mL (ref >3.0)

## 2016-12-27 LAB — INSULIN, RANDOM: INSULIN: 18.1 u[IU]/mL (ref 2.6–24.9)

## 2016-12-27 LAB — T3: T3 TOTAL: 115 ng/dL (ref 71–180)

## 2017-01-01 ENCOUNTER — Other Ambulatory Visit: Payer: Self-pay | Admitting: Nurse Practitioner

## 2017-01-10 ENCOUNTER — Ambulatory Visit
Admission: RE | Admit: 2017-01-10 | Discharge: 2017-01-10 | Disposition: A | Discharge status: Home / Self Care | Payer: 59 | Source: Ambulatory Visit | Attending: Obstetrics and Gynecology | Admitting: Obstetrics and Gynecology

## 2017-01-10 DIAGNOSIS — Z1231 Encounter for screening mammogram for malignant neoplasm of breast: Secondary | ICD-10-CM

## 2017-01-10 DIAGNOSIS — Z01419 Encounter for gynecological examination (general) (routine) without abnormal findings: Secondary | ICD-10-CM | POA: Diagnosis not present

## 2017-01-10 DIAGNOSIS — Z124 Encounter for screening for malignant neoplasm of cervix: Secondary | ICD-10-CM | POA: Diagnosis not present

## 2017-01-14 MED FILL — VICTOZA 2-PAK 18 MG/3 ML PE: 18 | 30 days supply | Qty: 6 | Fill #2

## 2017-01-16 ENCOUNTER — Ambulatory Visit (INDEPENDENT_AMBULATORY_CARE_PROVIDER_SITE_OTHER): Payer: 59 | Admitting: Family Medicine

## 2017-01-16 VITALS — BP 113/76 | HR 89 | Temp 98.2°F | Ht 67.0 in | Wt 260.0 lb

## 2017-01-16 DIAGNOSIS — E559 Vitamin D deficiency, unspecified: Secondary | ICD-10-CM | POA: Diagnosis not present

## 2017-01-16 DIAGNOSIS — R7303 Prediabetes: Secondary | ICD-10-CM | POA: Diagnosis not present

## 2017-01-16 DIAGNOSIS — Z9189 Other specified personal risk factors, not elsewhere classified: Secondary | ICD-10-CM

## 2017-01-16 DIAGNOSIS — Z6841 Body Mass Index (BMI) 40.0 and over, adult: Secondary | ICD-10-CM

## 2017-01-16 DIAGNOSIS — E669 Obesity, unspecified: Secondary | ICD-10-CM | POA: Diagnosis not present

## 2017-01-16 DIAGNOSIS — IMO0001 Reserved for inherently not codable concepts without codable children: Secondary | ICD-10-CM

## 2017-01-16 MED ORDER — VITAMIN D (ERGOCALCIFEROL) 1.25 MG (50000 UNIT) PO CAPS: 50000.0000 [IU] | ORAL_CAPSULE | ORAL | 0 refills | Status: DC

## 2017-01-16 NOTE — Progress Notes (Signed)
Office: [phone]  /  Fax: [phone]   HPI:   Chief Complaint: OBESITY Christina Rodriguez is here to discuss her progress with her obesity treatment plan. She is on the Category 3 plan and is following her eating plan approximately 90 % of the time. She states she is exercising 30 minutes 3 to 4 times per week. Christina Rodriguez maintained her weight since her last visit. She had to go longer between visits than recommended due to her work schedule. Her husband had weight loss surgery and is doing well. Christina Rodriguez still is eating out and is not planning her meals ahead of time as closely. Her weight is 260 lb (117.9 kg) today and she maintained her weight over a period of 3 weeks since her last visit. She has lost 0 lbs since starting treatment with [place].  Vitamin D deficiency Christina Rodriguez has a diagnosis of vitamin D deficiency. She improved and level is in the 30's but she is still not at goal of 50's. She is not currently taking vit D and denies nausea, vomiting or muscle weakness.  Pre-Diabetes Christina Rodriguez has a diagnosis of pre-diabetes and her Hgb A1c and glucose are within normal limits but her fasting insulin level is still elevated and was informed this puts her at greater risk of developing diabetes. She is taking victoza 1.2 mg and metformin for diabetes and PCOS and is working on diet and exercise to decrease risk of diabetes. She denies nausea or hypoglycemia.  At risk for diabetes Christina Rodriguez is at higher than average risk for developing diabetes due to her obesity and pre-diabetes. She currently denies polyuria or polydipsia.   ALLERGIES: Allergies  Allergen Reactions  . Beta Adrenergic Blockers Shortness Of Breath  . Shrimp [Shellfish Allergy] Hives  . Soybeans Hives    MEDICATIONS: Current Outpatient Prescriptions on File Prior to Visit  Medication Sig Dispense Refill  . albuterol (PROVENTIL HFA;VENTOLIN HFA) 108 (90 BASE) MCG/ACT inhaler Inhale 2 puffs into the lungs every 4 (four) hours as needed for  wheezing or shortness of breath. 1 Inhaler 5  . cyanocobalamin 500 MCG tablet Take 500 mcg by mouth daily.    Marland Kitchen diltiazem (CARDIZEM CD) 240 MG 24 hr capsule Take 1 capsule (240 mg total) by mouth daily. 90 capsule 1  . EPINEPHrine 0.3 mg/0.3 mL IJ SOAJ injection Inject 0.3 mg into the muscle once. Reported on 05/03/2015    . Fluticasone-Salmeterol (ADVAIR DISKUS) 250-50 MCG/DOSE AEPB Inhale 1 puff into the lungs 2 (two) times daily.      . hydrochlorothiazide (HYDRODIURIL) 25 MG tablet Take 1 tablet (25 mg total) by mouth daily. 90 tablet 1  . ibuprofen (ADVIL,MOTRIN) 200 MG tablet Take 400 mg by mouth every 6 (six) hours as needed for headache, mild pain or moderate pain.    . Insulin Pen Needle (PEN NEEDLES) 32G X 5 MM MISC 1.2 mg by Does not apply route daily. 100 each 0  . liraglutide (VICTOZA) 18 MG/3ML SOPN Start with 0.6 mg [place] qd x 1 week then 1.2 mg subcu qd 1 pen 2  . metFORMIN (GLUCOPHAGE-XR) 500 MG 24 hr tablet Take 2 tablets (1,000 mg total) by mouth daily with breakfast. 180 tablet 1  . Multiple Vitamins-Minerals (MULTIVITAMIN ADULT PO) Take 1 tablet by mouth daily.    . pantoprazole (PROTONIX) 40 MG tablet Take 1 tablet (40 mg total) by mouth daily. 90 tablet 1  . rizatriptan (MAXALT-MLT) 10 MG disintegrating tablet Take 1 tablet (10 mg total) by mouth as needed  for migraine. May repeat in 2 hours if needed; max 2 per 24 hours 27 tablet 1   No current facility-administered medications on file prior to visit.     PAST MEDICAL HISTORY: Past Medical History:  Diagnosis Date  . Adenomyosis   . Anemia    HIstory of   . Asthma    albuterol inhaler prn-recent uri-using more freq  . Dysrhythmia    history of svt-controlled on cardizem-diagnosed in 1999-  . Gall bladder disease   . GERD (gastroesophageal reflux disease)    uses protonix  . Headache    migraines   . History of polycystic ovarian disease   . Infertility associated with anovulation   . Metabolic syndrome   .  Multiple food allergies    shrimp  . Osteoarthritis   . Palpitations   . PCOS (polycystic ovarian syndrome)   . PONV (postoperative nausea and vomiting)   . Prediabetes   . Recurrent upper respiratory infection (URI)    resolving  . SVT (supraventricular tachycardia) (HCC)    History of   . Vitamin D deficiency   . VSD (ventricular septal defect)    history of -no problems    PAST SURGICAL HISTORY: Past Surgical History:  Procedure Laterality Date  . CESAREAN SECTION  2010  . CESAREAN SECTION  11/16/2010   Procedure: CESAREAN SECTION;  Surgeon: Almon Hercules;  Location: [facility];  Service: Gynecology;  Laterality: N/A;  Repeat   . CHOLECYSTECTOMY  2003  . DILATION AND CURETTAGE OF UTERUS    . HYSTEROSCOPY W/D&C N/A 12/06/2014   Procedure: DILATATION AND CURETTAGE /HYSTEROSCOPY;  Surgeon: Waynard Reeds, MD;  Location: [facility];  Service: Gynecology;  Laterality: N/A;  . uterine polyps      SOCIAL HISTORY: Social History  Substance Use Topics  . Smoking status: Never Smoker  . Smokeless tobacco: Never Used  . Alcohol use No    FAMILY HISTORY: Family History  Problem Relation Age of Onset  . Diabetes Father   . Hypertension Father   . Hyperlipidemia Father   . Sleep apnea Father   . Obesity Father     ROS: Review of Systems  Constitutional: Negative for weight loss.  Gastrointestinal: Negative for nausea and vomiting.  Genitourinary: Positive for frequency.  Musculoskeletal:       Negative muscle weakness  Endo/Heme/Allergies: Negative for polydipsia.       Negative hypoglycemia    PHYSICAL EXAM: Blood pressure 113/76, pulse 89, temperature 98.2 F (36.8 C), temperature source Oral, height  (1.702 m), weight 260 lb (117.9 kg), last menstrual period 01/10/2017, SpO2 98 %. Body mass index is 40.72 kg/m. Physical Exam  Constitutional: She is oriented to person, place, and time. She appears well-developed and well-nourished.  Cardiovascular: Normal rate.     Pulmonary/Chest: Effort normal.  Musculoskeletal: Normal range of motion.  Neurological: She is oriented to person, place, and time.  Skin: Skin is warm and dry.  Psychiatric: She has a normal mood and affect. Her behavior is normal.  Vitals reviewed.   RECENT LABS AND TESTS: BMET    Component Value Date/Time   NA 139 12/26/2016 0957   K 4.5 12/26/2016 0957   CL 105 12/26/2016 0957   CO2 21 12/26/2016 0957   GLUCOSE 86 12/26/2016 0957   GLUCOSE 127 (H) 11/16/2014 1625   BUN 10 12/26/2016 0957   CREATININE 0.70 12/26/2016 0957   CREATININE 0.62 01/20/2014 1317   CALCIUM 9.5 12/26/2016 0957   GFRNONAA  109 12/26/2016 0957   GFRAA 125 12/26/2016 0957   Lab Results  Component Value Date   HGBA1C 5.3 12/26/2016   HGBA1C 5.5 02/22/2016   Lab Results  Component Value Date   INSULIN 18.1 12/26/2016   CBC    Component Value Date/Time   WBC 6.7 12/26/2016 0957   WBC 8.9 11/16/2014 1625   RBC 4.64 12/26/2016 0957   RBC 4.34 11/16/2014 1625   HGB 12.9 12/26/2016 0957   HCT 39.7 12/26/2016 0957   PLT 389 (H) 01/26/2015 0823   MCV 86 12/26/2016 0957   MCH 27.8 12/26/2016 0957   MCH 27.0 11/16/2014 1625   MCHC 32.5 12/26/2016 0957   MCHC 31.8 11/16/2014 1625   RDW 15.2 12/26/2016 0957   LYMPHSABS 1.7 12/26/2016 0957   EOSABS 0.1 12/26/2016 0957   BASOSABS 0.0 12/26/2016 0957   Iron/TIBC/Ferritin/ %Sat    Component Value Date/Time   FERRITIN 21 02/22/2016 0823   Lipid Panel     Component Value Date/Time   CHOL 137 12/26/2016 0957   TRIG 51 12/26/2016 0957   HDL 33 (L) 12/26/2016 0957   CHOLHDL 3.6 02/22/2016 0823   CHOLHDL 4.6 01/20/2014 1317   VLDL 22 01/20/2014 1317   LDLCALC 94 12/26/2016 0957   Hepatic Function Panel     Component Value Date/Time   PROT 7.3 12/26/2016 0957   ALBUMIN 4.2 12/26/2016 0957   AST 19 12/26/2016 0957   ALT 33 (H) 12/26/2016 0957   ALKPHOS 76 12/26/2016 0957   BILITOT 0.4 12/26/2016 0957   BILIDIR 0.09 02/22/2016 0823    IBILI 0.2 01/20/2014 1317      Component Value Date/Time   TSH 2.220 12/26/2016 0957   TSH 3.130 02/22/2016 0823   TSH 2.880 01/26/2015 0823    ASSESSMENT AND PLAN: Prediabetes  Vitamin D deficiency - Plan: Vitamin D, Ergocalciferol, (DRISDOL) 50000 units CAPS capsule  At risk for diabetes mellitus  Class 3 obesity with serious comorbidity and body mass index (BMI) of 40.0 to 44.9 in adult, unspecified obesity type (HCC)  PLAN:  Vitamin D Deficiency Christina Rodriguez was informed that low vitamin D levels contributes to fatigue and are associated with obesity, breast, and colon cancer. She agrees to restart prescription Vit D @50 ,000 IU every week #4 with no refills and we will re-check labs in 3 months and will follow up for routine testing of vitamin D, at least 2-3 times per year. She was informed of the risk of over-replacement of vitamin D and agrees to not increase her dose unless he discusses this with us first. Christina Rodriguez agrees to follow up with our clinic in 2 to 3 weeks.  Pre-Diabetes Christina Rodriguez will continue to work on weight loss, exercise, and decreasing simple carbohydrates in her diet to help decrease the risk of diabetes. We dicussed metformin including benefits and risks. She was informed that eating too many simple carbohydrates or too many calories at one sitting increases the likelihood of GI side effects. Maitri agrees to continue metformin and victoza for now and a prescription was not written today. Christina Rodriguez agreed to follow up with us as directed to monitor her progress.  Diabetes risk counseling Christina Rodriguez was given extended (15 minutes) diabetes prevention counseling today. She is 41 y.o. female and has risk factors for diabetes including obesity and pre-diabetes. We discussed intensive lifestyle modifications today with an emphasis on weight loss as well as increasing exercise and decreasing simple carbohydrates in her diet.  Obesity Christina Rodriguez is currently in the  action stage of  change. As such, her goal is to continue with weight loss efforts She has agreed to keep a food journal with 400 to 600 calories and 35+ grams of protein at supper daily and follow the Category 3 plan Christina Rodriguez has been instructed to work up to a goal of 150 minutes of combined cardio and strengthening exercise per week for weight loss and overall health benefits. We discussed the following Behavioral Modification Strategies today: increasing lean protein intake and decreasing simple carbohydrates   Christina Rodriguez has agreed to follow up with our clinic in 2 to 3 weeks. She was informed of the importance of frequent follow up visits to maximize her success with intensive lifestyle modifications for her multiple health conditions.  I, Nevada Crane, am acting as transcriptionist for Quillian Quince, MD  I have reviewed the above documentation for accuracy and completeness, and I agree with the above. -Quillian Quince, MD    OBESITY BEHAVIORAL INTERVENTION VISIT  Today's visit was # 2 out of 22.  Starting weight: 260 lbs Starting date: 12/26/16 Today's weight : 260 lbs Today's date: 01/16/2017 Total lbs lost to date: 0 (Patients must lose 7 lbs in the first 6 months to continue with counseling)   ASK: We discussed the diagnosis of obesity with Christina Rodriguez today and Christina Rodriguez agreed to give [place] permission to discuss obesity behavioral modification therapy today.  ASSESS: Christina Rodriguez has the diagnosis of obesity and her BMI today is 40.71 Christina Rodriguez is in the action stage of change   ADVISE: Christina Rodriguez was educated on the multiple health risks of obesity as well as the benefit of weight loss to improve her health. She was advised of the need for long term treatment and the importance of lifestyle modifications.  AGREE: Multiple dietary modification options and treatment options were discussed and  Christina Rodriguez agreed to keep a food journal with 400 to 600 calories and 35+ grams of protein at supper daily and follow the  Category 3 plan We discussed the following Behavioral Modification Strategies today: increasing lean protein intake and decreasing simple carbohydrates

## 2017-01-29 ENCOUNTER — Other Ambulatory Visit: Payer: Self-pay | Admitting: Nurse Practitioner

## 2017-01-29 ENCOUNTER — Telehealth: Payer: Self-pay | Admitting: Nurse Practitioner

## 2017-01-29 MED ORDER — PEN NEEDLES 32G X 5 MM MISC: 1.8000 mg | Freq: Every day | 0 refills | Status: DC

## 2017-01-29 MED ORDER — LIRAGLUTIDE 18 MG/3ML ~~LOC~~ SOPN: PEN_INJECTOR | SUBCUTANEOUS | 1 refills | Status: DC

## 2017-01-29 NOTE — Telephone Encounter (Signed)
Pt also needs pen needles

## 2017-01-29 NOTE — Telephone Encounter (Signed)
Requesting refill on Victoza, new dose, to [facility].

## 2017-02-04 ENCOUNTER — Ambulatory Visit (INDEPENDENT_AMBULATORY_CARE_PROVIDER_SITE_OTHER): Payer: 59 | Admitting: Family Medicine

## 2017-02-04 VITALS — BP 107/72 | HR 81 | Temp 98.0°F | Ht 67.0 in | Wt 254.0 lb

## 2017-02-04 DIAGNOSIS — Z6839 Body mass index (BMI) 39.0-39.9, adult: Secondary | ICD-10-CM | POA: Diagnosis not present

## 2017-02-04 DIAGNOSIS — H5203 Hypermetropia, bilateral: Secondary | ICD-10-CM | POA: Diagnosis not present

## 2017-02-04 DIAGNOSIS — H52223 Regular astigmatism, bilateral: Secondary | ICD-10-CM | POA: Diagnosis not present

## 2017-02-04 DIAGNOSIS — H524 Presbyopia: Secondary | ICD-10-CM | POA: Diagnosis not present

## 2017-02-04 DIAGNOSIS — E119 Type 2 diabetes mellitus without complications: Secondary | ICD-10-CM | POA: Diagnosis not present

## 2017-02-04 NOTE — Progress Notes (Signed)
Office: [phone]  /  Fax: [phone]   HPI:   Chief Complaint: OBESITY Christina Rodriguez is here to discuss her progress with her obesity treatment plan. She is on the  keep a food journal with 400 to 600 calories and 35+ grams of protein at supper daily and follow the Category 3 plan and is following her eating plan approximately 90 to 95 % of the time. She states she is walking for 30 minutes 3 times per week. Christina Rodriguez continues to do well with weight loss on the category 3 plan with supper journaling. Hunger is controlled and she is not feeling deprived Her weight is 254 lb (115.2 kg) today and has had a weight loss of 6 pounds over a period of 3 weeks since her last visit. She has lost 6 lbs since starting treatment with [place].  Diabetes II Christina Rodriguez has a diagnosis of diabetes type II and is currently on victoza 1.2 mg and metformin. Christina Rodriguez denies any hypoglycemic episodes, nausea or vomiting. Last A1c was well controlled at 5.3 She has been working on intensive lifestyle modifications including diet, exercise, and weight loss to help control her blood glucose levels.   ALLERGIES: Allergies  Allergen Reactions  . Beta Adrenergic Blockers Shortness Of Breath  . Shrimp [Shellfish Allergy] Hives  . Soybeans Hives    MEDICATIONS: Current Outpatient Prescriptions on File Prior to Visit  Medication Sig Dispense Refill  . albuterol (PROVENTIL HFA;VENTOLIN HFA) 108 (90 BASE) MCG/ACT inhaler Inhale 2 puffs into the lungs every 4 (four) hours as needed for wheezing or shortness of breath. 1 Inhaler 5  . cyanocobalamin 500 MCG tablet Take 500 mcg by mouth daily.    Marland Kitchen diltiazem (CARDIZEM CD) 240 MG 24 hr capsule Take 1 capsule (240 mg total) by mouth daily. 90 capsule 1  . EPINEPHrine 0.3 mg/0.3 mL IJ SOAJ injection Inject 0.3 mg into the muscle once. Reported on 05/03/2015    . Fluticasone-Salmeterol (ADVAIR DISKUS) 250-50 MCG/DOSE AEPB Inhale 1 puff into the lungs 2 (two) times daily.      .  hydrochlorothiazide (HYDRODIURIL) 25 MG tablet Take 1 tablet (25 mg total) by mouth daily. 90 tablet 1  . ibuprofen (ADVIL,MOTRIN) 200 MG tablet Take 400 mg by mouth every 6 (six) hours as needed for headache, mild pain or moderate pain.    . Insulin Pen Needle (PEN NEEDLES) 32G X 5 MM MISC Inject 1.8 mg into the skin daily. 100 each 0  . liraglutide (VICTOZA) 18 MG/3ML SOPN Take  1.8 mg subcu qd 9 pen 1  . metFORMIN (GLUCOPHAGE-XR) 500 MG 24 hr tablet Take 2 tablets (1,000 mg total) by mouth daily with breakfast. 180 tablet 1  . Multiple Vitamins-Minerals (MULTIVITAMIN ADULT PO) Take 1 tablet by mouth daily.    . pantoprazole (PROTONIX) 40 MG tablet Take 1 tablet (40 mg total) by mouth daily. 90 tablet 1  . rizatriptan (MAXALT-MLT) 10 MG disintegrating tablet Take 1 tablet (10 mg total) by mouth as needed for migraine. May repeat in 2 hours if needed; max 2 per 24 hours 27 tablet 1  . Vitamin D, Ergocalciferol, (DRISDOL) 50000 units CAPS capsule Take 1 capsule (50,000 Units total) by mouth every 7 (seven) days. 4 capsule 0   No current facility-administered medications on file prior to visit.     PAST MEDICAL HISTORY: Past Medical History:  Diagnosis Date  . Adenomyosis   . Anemia    HIstory of   . Asthma    albuterol inhaler  prn-recent uri-using more freq  . Dysrhythmia    history of svt-controlled on cardizem-diagnosed in 1999-  . Gall bladder disease   . GERD (gastroesophageal reflux disease)    uses protonix  . Headache    migraines   . History of polycystic ovarian disease   . Infertility associated with anovulation   . Metabolic syndrome   . Multiple food allergies    shrimp  . Osteoarthritis   . Palpitations   . PCOS (polycystic ovarian syndrome)   . PONV (postoperative nausea and vomiting)   . Prediabetes   . Recurrent upper respiratory infection (URI)    resolving  . SVT (supraventricular tachycardia) (HCC)    History of   . Vitamin D deficiency   . VSD  (ventricular septal defect)    history of -no problems    PAST SURGICAL HISTORY: Past Surgical History:  Procedure Laterality Date  . CESAREAN SECTION  2010  . CESAREAN SECTION  11/16/2010   Procedure: CESAREAN SECTION;  Surgeon: Almon Hercules;  Location: [facility];  Service: Gynecology;  Laterality: N/A;  Repeat   . CHOLECYSTECTOMY  2003  . DILATION AND CURETTAGE OF UTERUS    . HYSTEROSCOPY W/D&C N/A 12/06/2014   Procedure: DILATATION AND CURETTAGE /HYSTEROSCOPY;  Surgeon: Waynard Reeds, MD;  Location: [facility];  Service: Gynecology;  Laterality: N/A;  . uterine polyps      SOCIAL HISTORY: Social History  Substance Use Topics  . Smoking status: Never Smoker  . Smokeless tobacco: Never Used  . Alcohol use No    FAMILY HISTORY: Family History  Problem Relation Age of Onset  . Diabetes Father   . Hypertension Father   . Hyperlipidemia Father   . Sleep apnea Father   . Obesity Father     ROS: Review of Systems  Constitutional: Positive for weight loss.  Gastrointestinal: Negative for nausea and vomiting.  Endo/Heme/Allergies:       Negative hypoglycemia    PHYSICAL EXAM: Blood pressure 107/72, pulse 81, temperature 98 F (36.7 C), temperature source Oral, height  (1.702 m), weight 254 lb (115.2 kg), last menstrual period 02/03/2017, SpO2 99 %. Body mass index is 39.78 kg/m. Physical Exam  Constitutional: She is oriented to person, place, and time. She appears well-developed and well-nourished.  Cardiovascular: Normal rate.   Pulmonary/Chest: Effort normal.  Musculoskeletal: Normal range of motion.  Neurological: She is oriented to person, place, and time.  Skin: Skin is warm and dry.  Psychiatric: She has a normal mood and affect. Her behavior is normal.  Vitals reviewed.   RECENT LABS AND TESTS: BMET    Component Value Date/Time   NA 139 12/26/2016 0957   K 4.5 12/26/2016 0957   CL 105 12/26/2016 0957   CO2 21 12/26/2016 0957   GLUCOSE 86 12/26/2016 0957     GLUCOSE 127 (H) 11/16/2014 1625   BUN 10 12/26/2016 0957   CREATININE 0.70 12/26/2016 0957   CREATININE 0.62 01/20/2014 1317   CALCIUM 9.5 12/26/2016 0957   GFRNONAA 109 12/26/2016 0957   GFRAA 125 12/26/2016 0957   Lab Results  Component Value Date   HGBA1C 5.3 12/26/2016   HGBA1C 5.5 02/22/2016   Lab Results  Component Value Date   INSULIN 18.1 12/26/2016   CBC    Component Value Date/Time   WBC 6.7 12/26/2016 0957   WBC 8.9 11/16/2014 1625   RBC 4.64 12/26/2016 0957   RBC 4.34 11/16/2014 1625   HGB 12.9 12/26/2016 0957  HCT 39.7 12/26/2016 0957   PLT 389 (H) 01/26/2015 0823   MCV 86 12/26/2016 0957   MCH 27.8 12/26/2016 0957   MCH 27.0 11/16/2014 1625   MCHC 32.5 12/26/2016 0957   MCHC 31.8 11/16/2014 1625   RDW 15.2 12/26/2016 0957   LYMPHSABS 1.7 12/26/2016 0957   EOSABS 0.1 12/26/2016 0957   BASOSABS 0.0 12/26/2016 0957   Iron/TIBC/Ferritin/ %Sat    Component Value Date/Time   FERRITIN 21 02/22/2016 0823   Lipid Panel     Component Value Date/Time   CHOL 137 12/26/2016 0957   TRIG 51 12/26/2016 0957   HDL 33 (L) 12/26/2016 0957   CHOLHDL 3.6 02/22/2016 0823   CHOLHDL 4.6 01/20/2014 1317   VLDL 22 01/20/2014 1317   LDLCALC 94 12/26/2016 0957   Hepatic Function Panel     Component Value Date/Time   PROT 7.3 12/26/2016 0957   ALBUMIN 4.2 12/26/2016 0957   AST 19 12/26/2016 0957   ALT 33 (H) 12/26/2016 0957   ALKPHOS 76 12/26/2016 0957   BILITOT 0.4 12/26/2016 0957   BILIDIR 0.09 02/22/2016 0823   IBILI 0.2 01/20/2014 1317      Component Value Date/Time   TSH 2.220 12/26/2016 0957   TSH 3.130 02/22/2016 0823   TSH 2.880 01/26/2015 0823    ASSESSMENT AND PLAN: Type 2 diabetes mellitus without complication, without long-term current use of insulin (HCC)  Class 2 severe obesity with serious comorbidity and body mass index (BMI) of 39.0 to 39.9 in adult, unspecified obesity type (HCC)  PLAN:  Diabetes II Christina Rodriguez has been given extensive  diabetes education by myself today including ideal fasting and post-prandial blood glucose readings, individual ideal Hgb A1c goals and hypoglycemia prevention. We discussed the importance of good blood sugar control to decrease the likelihood of diabetic complications such as nephropathy, neuropathy, limb loss, blindness, coronary artery disease, and death. We discussed the importance of intensive lifestyle modification including diet, exercise and weight loss as the first line treatment for diabetes. She agrees to continue with diet and exercise. Christina Rodriguez agrees to continue Victoza and will follow up at the agreed upon time.  We spent > than 50% of the 15 minute visit on the counseling as documented in the note.  Obesity Christina Rodriguez is currently in the action stage of change. As such, her goal is to continue with weight loss efforts She has agreed to keep a food journal with 400 to 600 calories and 35+ grams of protein at supper daily and follow the Category 3 plan Christina Rodriguez has been instructed to work up to a goal of 150 minutes of combined cardio and strengthening exercise per week for weight loss and overall health benefits. We discussed the following Behavioral Modification Strategies today: increasing lean protein intake, decreasing simple carbohydrates, work on meal planning and easy cooking plans and dealing with family or coworker sabotage  Christina Rodriguez has agreed to follow up with our clinic in 2 to 3 weeks. She was informed of the importance of frequent follow up visits to maximize her success with intensive lifestyle modifications for her multiple health conditions.  I, [place] Crane, am acting as transcriptionist for Quillian Quince, MD  I have reviewed the above documentation for accuracy and completeness, and I agree with the above. -Quillian Quince, MD   OBESITY BEHAVIORAL INTERVENTION VISIT  Today's visit was # 3 out of 22.  Starting weight: 260 lbs Starting date: 12/26/16 Today's weight : 254  lbs  Today's date: 02/04/2017 Total lbs lost  to date: 6 (Patients must lose 7 lbs in the first 6 months to continue with counseling)   ASK: We discussed the diagnosis of obesity with Christina Rodriguez today and Christina Rodriguez agreed to give [place] permission to discuss obesity behavioral modification therapy today.  ASSESS: Christina Rodriguez has the diagnosis of obesity and her BMI today is 39.77 Christina Rodriguez is in the action stage of change   ADVISE: Christina Rodriguez was educated on the multiple health risks of obesity as well as the benefit of weight loss to improve her health. She was advised of the need for long term treatment and the importance of lifestyle modifications.  AGREE: Multiple dietary modification options and treatment options were discussed and  Christina Rodriguez agreed to keep a food journal with 400 to 600 calories and 35+ grams of protein at supper daily and follow the Category 3 plan We discussed the following Behavioral Modification Strategies today: increasing lean protein intake, decreasing simple carbohydrates, work on meal planning and easy cooking plans and dealing with family or coworker sabotage

## 2017-02-06 MED FILL — VICTOZA 18 MG/3 ML INJECT P: 18 | 30 days supply | Qty: 9 | Fill #0

## 2017-02-14 ENCOUNTER — Other Ambulatory Visit: Payer: Self-pay | Admitting: Nurse Practitioner

## 2017-02-14 MED ORDER — AZITHROMYCIN 250 MG PO TABS: ORAL_TABLET | ORAL | 0 refills | Status: DC

## 2017-02-20 ENCOUNTER — Ambulatory Visit (INDEPENDENT_AMBULATORY_CARE_PROVIDER_SITE_OTHER): Payer: 59 | Admitting: Family Medicine

## 2017-02-20 VITALS — BP 115/76 | HR 77 | Temp 98.2°F | Ht 67.0 in | Wt 258.0 lb

## 2017-02-20 DIAGNOSIS — Z9189 Other specified personal risk factors, not elsewhere classified: Secondary | ICD-10-CM | POA: Diagnosis not present

## 2017-02-20 DIAGNOSIS — Z6841 Body Mass Index (BMI) 40.0 and over, adult: Secondary | ICD-10-CM | POA: Diagnosis not present

## 2017-02-20 DIAGNOSIS — F3289 Other specified depressive episodes: Secondary | ICD-10-CM | POA: Diagnosis not present

## 2017-02-20 DIAGNOSIS — E119 Type 2 diabetes mellitus without complications: Secondary | ICD-10-CM | POA: Diagnosis not present

## 2017-02-20 MED ORDER — BUPROPION HCL ER (SR) 150 MG PO TB12: 150.0000 mg | ORAL_TABLET | Freq: Every day | ORAL | 0 refills | Status: DC

## 2017-02-20 MED FILL — BUPROPION HCL XL 150 MG TAB: 150 | 30 days supply | Qty: 30 | Fill #0

## 2017-02-20 NOTE — Progress Notes (Signed)
Office: [phone]  /  Fax: [phone]   HPI:   Chief Complaint: OBESITY Christina Rodriguez is here to discuss her progress with her obesity treatment plan. She is on the keep a food journal with 400-600 calories and 35+ grams of protein at supper daily and follow the Category 3 plan and is following her eating plan approximately 90 % of the time. She states she is exercising walking 30 minutes 3 times per week. [name] has increased traveling and increased eating out.  Her weight is 258 lb (117 kg) today and has gained 4 pounds since her last visit. She has lost 2 lbs since starting treatment with [place].  Diabetes II [name] has a diagnosis of diabetes type II. Her last A1c at 5.3 on metformin and Victoza. Guyla states she is not checking BGs at home. She denies any hypoglycemic episodes but off the plan more recently. She has been working on intensive lifestyle modifications including diet, exercise, and weight loss to help control her blood glucose levels.  At risk for cardiovascular disease [name] is at a higher than average risk for cardiovascular disease due to obesity and diabetes. She currently denies any chest pain.  Depression with emotional eating behaviors [name] is struggling with emotional eating and it is worse on prednisone. She is using food for comfort to the extent that it is negatively impacting her health. She often snacks when she is not hungry. [name] sometimes feels she is out of control and then feels guilty that she made poor food choices. She has been working on behavior modification techniques to help reduce her emotional eating and has been somewhat successful. She shows no sign of suicidal or homicidal ideations.  Depression screen [name] 2/9 12/26/2016 06/19/2016 08/17/2015 06/19/2015 05/03/2015  Decreased Interest 0 0 0 0 0  Down, Depressed, Hopeless 0 0 0 0 0  PHQ - 2 Score 0 0 0 0 0  Altered sleeping 0 - - - -  Tired, decreased energy 1 - - - -  Change in appetite 0 - - -  -  Feeling bad or failure about yourself  0 - - - -  Trouble concentrating 0 - - - -  Moving slowly or fidgety/restless 0 - - - -  Suicidal thoughts 0 - - - -  PHQ-9 Score 1 - - - -  Difficult doing work/chores Not difficult at all - - - -   ALLERGIES: Allergies  Allergen Reactions  . Beta Adrenergic Blockers Shortness Of Breath  . Shrimp [Shellfish Allergy] Hives  . Soybeans Hives    MEDICATIONS: Current Outpatient Prescriptions on File Prior to Visit  Medication Sig Dispense Refill  . albuterol (PROVENTIL HFA;VENTOLIN HFA) 108 (90 BASE) MCG/ACT inhaler Inhale 2 puffs into the lungs every 4 (four) hours as needed for wheezing or shortness of breath. 1 Inhaler 5  . azithromycin (ZITHROMAX Z-PAK) 250 MG tablet Take 2 tablets (500 mg) on  Day 1,  followed by 1 tablet (250 mg) once daily on Days 2 through 5. 6 each 0  . cyanocobalamin 500 MCG tablet Take 500 mcg by mouth daily.    Marland Kitchen diltiazem (CARDIZEM CD) 240 MG 24 hr capsule Take 1 capsule (240 mg total) by mouth daily. 90 capsule 1  . EPINEPHrine 0.3 mg/0.3 mL IJ SOAJ injection Inject 0.3 mg into the muscle once. Reported on 05/03/2015    . Fluticasone-Salmeterol (ADVAIR DISKUS) 250-50 MCG/DOSE AEPB Inhale 1 puff into the lungs 2 (two) times daily.      Marland Kitchen  hydrochlorothiazide (HYDRODIURIL) 25 MG tablet Take 1 tablet (25 mg total) by mouth daily. 90 tablet 1  . ibuprofen (ADVIL,MOTRIN) 200 MG tablet Take 400 mg by mouth every 6 (six) hours as needed for headache, mild pain or moderate pain.    . Insulin Pen Needle (PEN NEEDLES) 32G X 5 MM MISC Inject 1.8 mg into the skin daily. 100 each 0  . liraglutide (VICTOZA) 18 MG/3ML SOPN Take  1.8 mg subcu qd 9 pen 1  . metFORMIN (GLUCOPHAGE-XR) 500 MG 24 hr tablet Take 2 tablets (1,000 mg total) by mouth daily with breakfast. 180 tablet 1  . Multiple Vitamins-Minerals (MULTIVITAMIN ADULT PO) Take 1 tablet by mouth daily.    . pantoprazole (PROTONIX) 40 MG tablet Take 1 tablet (40 mg total) by  mouth daily. 90 tablet 1  . rizatriptan (MAXALT-MLT) 10 MG disintegrating tablet Take 1 tablet (10 mg total) by mouth as needed for migraine. May repeat in 2 hours if needed; max 2 per 24 hours 27 tablet 1  . Vitamin D, Ergocalciferol, (DRISDOL) 50000 units CAPS capsule Take 1 capsule (50,000 Units total) by mouth every 7 (seven) days. 4 capsule 0   No current facility-administered medications on file prior to visit.     PAST MEDICAL HISTORY: Past Medical History:  Diagnosis Date  . Adenomyosis   . Anemia    HIstory of   . Asthma    albuterol inhaler prn-recent uri-using more freq  . Dysrhythmia    history of svt-controlled on cardizem-diagnosed in 1999-  . Gall bladder disease   . GERD (gastroesophageal reflux disease)    uses protonix  . Headache    migraines   . History of polycystic ovarian disease   . Infertility associated with anovulation   . Metabolic syndrome   . Multiple food allergies    shrimp  . Osteoarthritis   . Palpitations   . PCOS (polycystic ovarian syndrome)   . PONV (postoperative nausea and vomiting)   . Prediabetes   . Recurrent upper respiratory infection (URI)    resolving  . SVT (supraventricular tachycardia) (HCC)    History of   . Vitamin D deficiency   . VSD (ventricular septal defect)    history of -no problems    PAST SURGICAL HISTORY: Past Surgical History:  Procedure Laterality Date  . CESAREAN SECTION  2010  . CESAREAN SECTION  11/16/2010   Procedure: CESAREAN SECTION;  Surgeon: Almon Hercules;  Location: [facility];  Service: Gynecology;  Laterality: N/A;  Repeat   . CHOLECYSTECTOMY  2003  . DILATION AND CURETTAGE OF UTERUS    . HYSTEROSCOPY W/D&C N/A 12/06/2014   Procedure: DILATATION AND CURETTAGE /HYSTEROSCOPY;  Surgeon: Waynard Reeds, MD;  Location: [facility];  Service: Gynecology;  Laterality: N/A;  . uterine polyps      SOCIAL HISTORY: Social History  Substance Use Topics  . Smoking status: Never Smoker  . Smokeless tobacco: Never  Used  . Alcohol use No    FAMILY HISTORY: Family History  Problem Relation Age of Onset  . Diabetes Father   . Hypertension Father   . Hyperlipidemia Father   . Sleep apnea Father   . Obesity Father     ROS: Review of Systems  Constitutional: Negative for weight loss.  Cardiovascular: Negative for chest pain.  Endo/Heme/Allergies:       Negative hypoglycemia  Psychiatric/Behavioral: Positive for depression. Negative for suicidal ideas.    PHYSICAL EXAM: Blood pressure 115/76, pulse 77, temperature 98.2 F (  36.8 C), temperature source Oral, height 5\' 7"  (1.702 m), weight 258 lb (117 kg), last menstrual period 02/03/2017, SpO2 98 %. Body mass index is 40.41 kg/m. Physical Exam  Constitutional: She is oriented to person, place, and time. She appears well-developed and well-nourished.  Cardiovascular: Normal rate.   Pulmonary/Chest: Effort normal.  Musculoskeletal: Normal range of motion.  Neurological: She is oriented to person, place, and time.  Skin: Skin is warm and dry.  Psychiatric: She has a normal mood and affect. Her behavior is normal.  Vitals reviewed.   RECENT LABS AND TESTS: BMET    Component Value Date/Time   NA 139 12/26/2016 0957   K 4.5 12/26/2016 0957   CL 105 12/26/2016 0957   CO2 21 12/26/2016 0957   GLUCOSE 86 12/26/2016 0957   GLUCOSE 127 (H) 11/16/2014 1625   BUN 10 12/26/2016 0957   CREATININE 0.70 12/26/2016 0957   CREATININE 0.62 01/20/2014 1317   CALCIUM 9.5 12/26/2016 0957   GFRNONAA 109 12/26/2016 0957   GFRAA 125 12/26/2016 0957   Lab Results  Component Value Date   HGBA1C 5.3 12/26/2016   HGBA1C 5.5 02/22/2016   Lab Results  Component Value Date   INSULIN 18.1 12/26/2016   CBC    Component Value Date/Time   WBC 6.7 12/26/2016 0957   WBC 8.9 11/16/2014 1625   RBC 4.64 12/26/2016 0957   RBC 4.34 11/16/2014 1625   HGB 12.9 12/26/2016 0957   HCT 39.7 12/26/2016 0957   PLT 389 (H) 01/26/2015 0823   MCV 86 12/26/2016 0957    MCH 27.8 12/26/2016 0957   MCH 27.0 11/16/2014 1625   MCHC 32.5 12/26/2016 0957   MCHC 31.8 11/16/2014 1625   RDW 15.2 12/26/2016 0957   LYMPHSABS 1.7 12/26/2016 0957   EOSABS 0.1 12/26/2016 0957   BASOSABS 0.0 12/26/2016 0957   Iron/TIBC/Ferritin/ %Sat    Component Value Date/Time   FERRITIN 21 02/22/2016 0823   Lipid Panel     Component Value Date/Time   CHOL 137 12/26/2016 0957   TRIG 51 12/26/2016 0957   HDL 33 (L) 12/26/2016 0957   CHOLHDL 3.6 02/22/2016 0823   CHOLHDL 4.6 01/20/2014 1317   VLDL 22 01/20/2014 1317   LDLCALC 94 12/26/2016 0957   Hepatic Function Panel     Component Value Date/Time   PROT 7.3 12/26/2016 0957   ALBUMIN 4.2 12/26/2016 0957   AST 19 12/26/2016 0957   ALT 33 (H) 12/26/2016 0957   ALKPHOS 76 12/26/2016 0957   BILITOT 0.4 12/26/2016 0957   BILIDIR 0.09 02/22/2016 0823   IBILI 0.2 01/20/2014 1317      Component Value Date/Time   TSH 2.220 12/26/2016 0957   TSH 3.130 02/22/2016 0823   TSH 2.880 01/26/2015 0823    ASSESSMENT AND PLAN: Type 2 diabetes mellitus without complication, without long-term current use of insulin (HCC)  Other depression - with emotional eating  At risk for heart disease  Class 3 severe obesity with serious comorbidity and body mass index (BMI) of 40.0 to 44.9 in adult, unspecified obesity type (HCC)  PLAN:  Diabetes II Nalda has been given extensive diabetes education by myself today including ideal fasting and post-prandial blood glucose readings, individual ideal HgA1c goals  and hypoglycemia prevention. We discussed the importance of good blood sugar control to decrease the likelihood of diabetic complications such as nephropathy, neuropathy, limb loss, blindness, coronary artery disease, and death. We discussed the importance of intensive lifestyle modification including diet, exercise and  weight loss as the first line treatment for diabetes. Seda agrees to continue her diabetes medications as  prescribed and she will follow up with our clinic in 2 weeks.  Cardiovascular risk counselling Angelli was given extended (15 minutes) coronary artery disease prevention counseling today. She is 41 y.o. female and has risk factors for heart disease including obesity and diabetes. We discussed intensive lifestyle modifications today with an emphasis on specific weight loss instructions and strategies. Pt was also informed of the importance of increasing exercise and decreasing saturated fats to help prevent heart disease.  Depression with Emotional Eating Behaviors We discussed behavior modification techniques today to help Elyanah deal with her emotional eating and depression. She has agrees to start Wellbutrin SR 150 mg q AM #30 with no refills. Eowyn agrees to follow up with our clinic in 2 weeks.  Obesity Anuradha is currently in the action stage of change. As such, her goal is to continue with weight loss efforts She has agreed to keep a food journal with 400-600 calories and 35+ grams of protein at supper daily and follow the Category 3 plan and recipes were given as well. Kavita has been instructed to work up to a goal of 150 minutes of combined cardio and strengthening exercise per week for weight loss and overall health benefits. We discussed the following Behavioral Modification Strategies today: increasing lean protein intake, decreasing simple carbohydrates, work on meal planning and easy cooking plans, and emotional eating strategies.    Marnee has agreed to follow up with our clinic in 2 weeks. She was informed of the importance of frequent follow up visits to maximize her success with intensive lifestyle modifications for her multiple health conditions.  I, Burt KnackSharon Martin, am acting as transcriptionist for Quillian Quincearen Beasley, MD  I have reviewed the above documentation for accuracy and completeness, and I agree with the above. -Quillian Quincearen Beasley, MD     Today's visit was # 4 out of  22.  Starting weight: 260 lbs Starting date: 12/26/16 Today's weight : 258 lbs  Today's date: 02/20/2017 Total lbs lost to date: 2 (Patients must lose 7 lbs in the first 6 months to continue with counseling)   ASK: We discussed the diagnosis of obesity with Zamorah S Brigandi today and Raiana agreed to give us permission to discuss obesity behavioral modification therapy today.  ASSESS: Denya has the diagnosis of obesity and her BMI today is 4840 Laraina is in the action stage of change   ADVISE: Atonya was educated on the multiple health risks of obesity as well as the benefit of weight loss to improve her health. She was advised of the need for long term treatment and the importance of lifestyle modifications.  AGREE: Multiple dietary modification options and treatment options were discussed and  Jesicca agreed to keep a food journal with 400-600 calories and 35+ grams of protein at supper daily and follow the Category 3 plan We discussed the following Behavioral Modification Strategies today: increasing lean protein intake, decreasing simple carbohydrates, work on meal planning and easy cooking plans, and emotional eating strategies

## 2017-03-06 ENCOUNTER — Ambulatory Visit (INDEPENDENT_AMBULATORY_CARE_PROVIDER_SITE_OTHER): Payer: 59 | Admitting: Family Medicine

## 2017-03-06 VITALS — BP 113/77 | HR 90 | Temp 98.3°F | Ht 67.0 in | Wt 254.0 lb

## 2017-03-06 DIAGNOSIS — F5089 Other specified eating disorder: Secondary | ICD-10-CM

## 2017-03-06 DIAGNOSIS — E119 Type 2 diabetes mellitus without complications: Secondary | ICD-10-CM

## 2017-03-06 DIAGNOSIS — Z6839 Body mass index (BMI) 39.0-39.9, adult: Secondary | ICD-10-CM

## 2017-03-06 NOTE — Progress Notes (Signed)
Office: [phone]  /  Fax: [phone]   HPI:   Chief Complaint: OBESITY Christina Rodriguez is here to discuss her progress with her obesity treatment plan. She is on the keep a food journal with 400 to 600 calories and 35+ grams of protein at supper daily and follow the Category 3 plan and is following her eating plan approximately 90 % of the time. She states she is walking for 30 minutes 3 to 4 times per week. Christina Rodriguez continues to do well with weight loss on the category 3 plan and is back on track. She is doing better avoiding temptations and planning meals ahead of time. Her weight is 254 lb (115.2 kg) today and has had a weight loss of 4 pounds over a period of 2 weeks since her last visit. She has lost 6 lbs since starting treatment with [place].  Diabetes II Christina Rodriguez has a diagnosis of diabetes type II and is on victoza 1.8 mg and metformin. Christina Rodriguez has decreased polyphagia and denies any hypoglycemic episodes. She has been working on intensive lifestyle modifications including diet, exercise, and weight loss to help control her blood glucose levels.  Other Eating Disorder Christina Rodriguez didn't start wellbutrin as she was concerned about side effects and felt she could manage with behavior modifications.   ALLERGIES: Allergies  Allergen Reactions  . Beta Adrenergic Blockers Shortness Of Breath  . Shrimp [Shellfish Allergy] Hives  . Soybeans Hives    MEDICATIONS: Current Outpatient Medications on File Prior to Visit  Medication Sig Dispense Refill  . albuterol (PROVENTIL HFA;VENTOLIN HFA) 108 (90 BASE) MCG/ACT inhaler Inhale 2 puffs into the lungs every 4 (four) hours as needed for wheezing or shortness of breath. 1 Inhaler 5  . cyanocobalamin 500 MCG tablet Take 500 mcg by mouth daily.    Marland Kitchen diltiazem (CARDIZEM CD) 240 MG 24 hr capsule Take 1 capsule (240 mg total) by mouth daily. 90 capsule 1  . EPINEPHrine 0.3 mg/0.3 mL IJ SOAJ injection Inject 0.3 mg into the muscle once. Reported on 05/03/2015     . Fluticasone-Salmeterol (ADVAIR DISKUS) 250-50 MCG/DOSE AEPB Inhale 1 puff into the lungs 2 (two) times daily.      . hydrochlorothiazide (HYDRODIURIL) 25 MG tablet Take 1 tablet (25 mg total) by mouth daily. 90 tablet 1  . ibuprofen (ADVIL,MOTRIN) 200 MG tablet Take 400 mg by mouth every 6 (six) hours as needed for headache, mild pain or moderate pain.    . Insulin Pen Needle (PEN NEEDLES) 32G X 5 MM MISC Inject 1.8 mg into the skin daily. 100 each 0  . liraglutide (VICTOZA) 18 MG/3ML SOPN Take  1.8 mg subcu qd 9 pen 1  . metFORMIN (GLUCOPHAGE-XR) 500 MG 24 hr tablet Take 2 tablets (1,000 mg total) by mouth daily with breakfast. 180 tablet 1  . Multiple Vitamins-Minerals (MULTIVITAMIN ADULT PO) Take 1 tablet by mouth daily.    . pantoprazole (PROTONIX) 40 MG tablet Take 1 tablet (40 mg total) by mouth daily. 90 tablet 1  . rizatriptan (MAXALT-MLT) 10 MG disintegrating tablet Take 1 tablet (10 mg total) by mouth as needed for migraine. May repeat in 2 hours if needed; max 2 per 24 hours 27 tablet 1  . Vitamin D, Ergocalciferol, (DRISDOL) 50000 units CAPS capsule Take 1 capsule (50,000 Units total) by mouth every 7 (seven) days. 4 capsule 0   No current facility-administered medications on file prior to visit.     PAST MEDICAL HISTORY: Past Medical History:  Diagnosis Date  .  Adenomyosis   . Anemia    HIstory of   . Asthma    albuterol inhaler prn-recent uri-using more freq  . Dysrhythmia    history of svt-controlled on cardizem-diagnosed in 1999-  . Gall bladder disease   . GERD (gastroesophageal reflux disease)    uses protonix  . Headache    migraines   . History of polycystic ovarian disease   . Infertility associated with anovulation   . Metabolic syndrome   . Multiple food allergies    shrimp  . Osteoarthritis   . Palpitations   . PCOS (polycystic ovarian syndrome)   . PONV (postoperative nausea and vomiting)   . Prediabetes   . Recurrent upper respiratory infection  (URI)    resolving  . SVT (supraventricular tachycardia) (HCC)    History of   . Vitamin D deficiency   . VSD (ventricular septal defect)    history of -no problems    PAST SURGICAL HISTORY: Past Surgical History:  Procedure Laterality Date  . CESAREAN SECTION  2010  . CHOLECYSTECTOMY  2003  . DILATION AND CURETTAGE OF UTERUS    . uterine polyps      SOCIAL HISTORY: Social History   Tobacco Use  . Smoking status: Never Smoker  . Smokeless tobacco: Never Used  Substance Use Topics  . Alcohol use: No  . Drug use: No    FAMILY HISTORY: Family History  Problem Relation Age of Onset  . Diabetes Father   . Hypertension Father   . Hyperlipidemia Father   . Sleep apnea Father   . Obesity Father     ROS: Review of Systems  Constitutional: Positive for weight loss.  Endo/Heme/Allergies:       Positive polyphagia Negative hypoglycemia    PHYSICAL EXAM: Blood pressure 113/77, pulse 90, temperature 98.3 F (36.8 C), temperature source Oral, height 5\' 7"  (1.702 m), weight 254 lb (115.2 kg), SpO2 99 %. Body mass index is 39.78 kg/m. Physical Exam  Constitutional: She is oriented to person, place, and time. She appears well-developed and well-nourished.  Cardiovascular: Normal rate.   Pulmonary/Chest: Effort normal.  Musculoskeletal: Normal range of motion.  Neurological: She is oriented to person, place, and time.  Skin: Skin is warm and dry.  Psychiatric: She has a normal mood and affect. Her behavior is normal.  Vitals reviewed.   RECENT LABS AND TESTS: BMET    Component Value Date/Time   NA 139 12/26/2016 0957   K 4.5 12/26/2016 0957   CL 105 12/26/2016 0957   CO2 21 12/26/2016 0957   GLUCOSE 86 12/26/2016 0957   GLUCOSE 127 (H) 11/16/2014 1625   BUN 10 12/26/2016 0957   CREATININE 0.70 12/26/2016 0957   CREATININE 0.62 01/20/2014 1317   CALCIUM 9.5 12/26/2016 0957   GFRNONAA 109 12/26/2016 0957   GFRAA 125 12/26/2016 0957   Lab Results  Component  Value Date   HGBA1C 5.3 12/26/2016   HGBA1C 5.5 02/22/2016   Lab Results  Component Value Date   INSULIN 18.1 12/26/2016   CBC    Component Value Date/Time   WBC 6.7 12/26/2016 0957   WBC 8.9 11/16/2014 1625   RBC 4.64 12/26/2016 0957   RBC 4.34 11/16/2014 1625   HGB 12.9 12/26/2016 0957   HCT 39.7 12/26/2016 0957   PLT 389 (H) 01/26/2015 0823   MCV 86 12/26/2016 0957   MCH 27.8 12/26/2016 0957   MCH 27.0 11/16/2014 1625   MCHC 32.5 12/26/2016 0957   MCHC 31.8  11/16/2014 1625   RDW 15.2 12/26/2016 0957   LYMPHSABS 1.7 12/26/2016 0957   EOSABS 0.1 12/26/2016 0957   BASOSABS 0.0 12/26/2016 0957   Iron/TIBC/Ferritin/ %Sat    Component Value Date/Time   FERRITIN 21 02/22/2016 0823   Lipid Panel     Component Value Date/Time   CHOL 137 12/26/2016 0957   TRIG 51 12/26/2016 0957   HDL 33 (L) 12/26/2016 0957   CHOLHDL 3.6 02/22/2016 0823   CHOLHDL 4.6 01/20/2014 1317   VLDL 22 01/20/2014 1317   LDLCALC 94 12/26/2016 0957   Hepatic Function Panel     Component Value Date/Time   PROT 7.3 12/26/2016 0957   ALBUMIN 4.2 12/26/2016 0957   AST 19 12/26/2016 0957   ALT 33 (H) 12/26/2016 0957   ALKPHOS 76 12/26/2016 0957   BILITOT 0.4 12/26/2016 0957   BILIDIR 0.09 02/22/2016 0823   IBILI 0.2 01/20/2014 1317      Component Value Date/Time   TSH 2.220 12/26/2016 0957   TSH 3.130 02/22/2016 0823   TSH 2.880 01/26/2015 0823    ASSESSMENT AND PLAN: Type 2 diabetes mellitus without complication, without long-term current use of insulin (HCC)  Other specified eating disorder  Class 2 severe obesity with serious comorbidity and body mass index (BMI) of 39.0 to 39.9 in adult, unspecified obesity type (HCC)  PLAN:  Diabetes II Buffey has been given extensive diabetes education by myself today including ideal fasting and post-prandial blood glucose readings, individual ideal Hgb A1c goals  and hypoglycemia prevention. We discussed the importance of good blood sugar  control to decrease the likelihood of diabetic complications such as nephropathy, neuropathy, limb loss, blindness, coronary artery disease, and death. We discussed the importance of intensive lifestyle modification including diet, exercise and weight loss as the first line treatment for diabetes. Daleiza agrees to continue victoza and metformin and will follow up at the agreed upon time.  Other Eating Disorder Tajuana was advised that it is okay to discontinue wellbutrin and continue to monitor emotional eating.   We spent > than 50% of the 15 minute visit on the counseling as documented in the note.  Obesity Isabelle is currently in the action stage of change. As such, her goal is to continue with weight loss efforts She has agreed to follow the Category 3 plan Keelyn has been instructed to work up to a goal of 150 minutes of combined cardio and strengthening exercise per week for weight loss and overall health benefits. We discussed the following Behavioral Modification Strategies today: increasing lean protein intake, decreasing simple carbohydrates , work on meal planning and easy cooking plans, holiday eating strategies  and emotional eating strategies  Averil has agreed to follow up with our clinic in 3 weeks. She was informed of the importance of frequent follow up visits to maximize her success with intensive lifestyle modifications for her multiple health conditions.  I, [place] CraneJoanne Murray, am acting as transcriptionist for Quillian Quincearen Amirr Achord, MD  I have reviewed the above documentation for accuracy and completeness, and I agree with the above. -Quillian Quincearen Jamala Kohen, MD    OBESITY BEHAVIORAL INTERVENTION VISIT  Today's visit was # 5 out of 22.  Starting weight: 260 lbs Starting date: 12/26/16 Today's weight : 254 lbs Today's date: 03/12/2017 Total lbs lost to date: 6 (Patients must lose 7 lbs in the first 6 months to continue with counseling)   ASK: We discussed the diagnosis of obesity with  Deshanti S Magnone today and Nikolette agreed to give  [place] permission to discuss obesity behavioral modification therapy today.  ASSESS: Krisandra has the diagnosis of obesity and her BMI today is 39.77 Cleotha is in the action stage of change   ADVISE: Trishelle was educated on the multiple health risks of obesity as well as the benefit of weight loss to improve her health. She was advised of the need for long term treatment and the importance of lifestyle modifications.  AGREE: Multiple dietary modification options and treatment options were discussed and  Fannie agreed to follow the Category 3 plan We discussed the following Behavioral Modification Strategies today: increasing lean protein intake, decreasing simple carbohydrates , work on meal planning and easy cooking plans, holiday eating strategies  and emotional eating strategies

## 2017-03-11 MED FILL — VICTOZA 18 MG/3 ML INJECT P: 18 | 30 days supply | Qty: 9 | Fill #1

## 2017-03-26 ENCOUNTER — Ambulatory Visit (INDEPENDENT_AMBULATORY_CARE_PROVIDER_SITE_OTHER): Payer: 59 | Admitting: Physician Assistant

## 2017-03-26 VITALS — BP 111/77 | HR 80 | Temp 98.4°F | Ht 67.0 in | Wt 253.0 lb

## 2017-03-26 DIAGNOSIS — Z6839 Body mass index (BMI) 39.0-39.9, adult: Secondary | ICD-10-CM

## 2017-03-26 DIAGNOSIS — E282 Polycystic ovarian syndrome: Secondary | ICD-10-CM | POA: Diagnosis not present

## 2017-03-26 NOTE — Progress Notes (Signed)
Office: [phone]  /  Fax: [phone]   HPI:   Chief Complaint: OBESITY Soffia is here to discuss her progress with her obesity treatment plan. She is on the Category 3 plan and is following her eating plan approximately 90-95 % of the time. She states she is exercising 0 minutes 0 times per week. Alaja continues to do well with weight loss. She has been incorporating variety into her meals. She would like holiday eating strategies.  Her weight is 253 lb (114.8 kg) today and has had a weight loss of 1 pound over a period of 3 weeks since her last visit. She has lost 7 lbs since starting treatment with us.  Polycystic Ovary Syndrome Adira has polycystic ovary syndrome. She is on Victoza and metformin by her gynecologist for PCOS. She denies a history of diabetes.  ALLERGIES: Allergies  Allergen Reactions  . Beta Adrenergic Blockers Shortness Of Breath  . Shrimp [Shellfish Allergy] Hives  . Soybeans Hives    MEDICATIONS: Current Outpatient Medications on File Prior to Visit  Medication Sig Dispense Refill  . albuterol (PROVENTIL HFA;VENTOLIN HFA) 108 (90 BASE) MCG/ACT inhaler Inhale 2 puffs into the lungs every 4 (four) hours as needed for wheezing or shortness of breath. 1 Inhaler 5  . cyanocobalamin 500 MCG tablet Take 500 mcg by mouth daily.    Marland Kitchen. diltiazem (CARDIZEM CD) 240 MG 24 hr capsule Take 1 capsule (240 mg total) by mouth daily. 90 capsule 1  . EPINEPHrine 0.3 mg/0.3 mL IJ SOAJ injection Inject 0.3 mg into the muscle once. Reported on 05/03/2015    . Fluticasone-Salmeterol (ADVAIR DISKUS) 250-50 MCG/DOSE AEPB Inhale 1 puff into the lungs 2 (two) times daily.      . hydrochlorothiazide (HYDRODIURIL) 25 MG tablet Take 1 tablet (25 mg total) by mouth daily. 90 tablet 1  . ibuprofen (ADVIL,MOTRIN) 200 MG tablet Take 400 mg by mouth every 6 (six) hours as needed for headache, mild pain or moderate pain.    . Insulin Pen Needle (PEN NEEDLES) 32G X 5 MM MISC Inject 1.8 mg  into the skin daily. 100 each 0  . liraglutide (VICTOZA) 18 MG/3ML SOPN Take  1.8 mg subcu qd 9 pen 1  . metFORMIN (GLUCOPHAGE-XR) 500 MG 24 hr tablet Take 2 tablets (1,000 mg total) by mouth daily with breakfast. 180 tablet 1  . Multiple Vitamins-Minerals (MULTIVITAMIN ADULT PO) Take 1 tablet by mouth daily.    . pantoprazole (PROTONIX) 40 MG tablet Take 1 tablet (40 mg total) by mouth daily. 90 tablet 1  . rizatriptan (MAXALT-MLT) 10 MG disintegrating tablet Take 1 tablet (10 mg total) by mouth as needed for migraine. May repeat in 2 hours if needed; max 2 per 24 hours 27 tablet 1  . Vitamin D, Ergocalciferol, (DRISDOL) 50000 units CAPS capsule Take 1 capsule (50,000 Units total) by mouth every 7 (seven) days. 4 capsule 0   No current facility-administered medications on file prior to visit.     PAST MEDICAL HISTORY: Past Medical History:  Diagnosis Date  . Adenomyosis   . Anemia    HIstory of   . Asthma    albuterol inhaler prn-recent uri-using more freq  . Dysrhythmia    history of svt-controlled on cardizem-diagnosed in 1999-  . Gall bladder disease   . GERD (gastroesophageal reflux disease)    uses protonix  . Headache    migraines   . History of polycystic ovarian disease   . Infertility associated with anovulation   .  Metabolic syndrome   . Multiple food allergies    shrimp  . Osteoarthritis   . Palpitations   . PCOS (polycystic ovarian syndrome)   . PONV (postoperative nausea and vomiting)   . Prediabetes   . Recurrent upper respiratory infection (URI)    resolving  . SVT (supraventricular tachycardia) (HCC)    History of   . Vitamin D deficiency   . VSD (ventricular septal defect)    history of -no problems    PAST SURGICAL HISTORY: Past Surgical History:  Procedure Laterality Date  . CESAREAN SECTION  2010  . CESAREAN SECTION  11/16/2010   Procedure: CESAREAN SECTION;  Surgeon: Almon HerculesKendra H. Ross;  Location: [facility];  Service: Gynecology;  Laterality: N/A;   Repeat   . CHOLECYSTECTOMY  2003  . DILATION AND CURETTAGE OF UTERUS    . HYSTEROSCOPY W/D&C N/A 12/06/2014   Procedure: DILATATION AND CURETTAGE /HYSTEROSCOPY;  Surgeon: [name], MD;  Location: [facility];  Service: Gynecology;  Laterality: N/A;  . uterine polyps      SOCIAL HISTORY: Social History   Tobacco Use  . Smoking status: Never Smoker  . Smokeless tobacco: Never Used  Substance Use Topics  . Alcohol use: No  . Drug use: No    FAMILY HISTORY: Family History  Problem Relation Age of Onset  . Diabetes Father   . Hypertension Father   . Hyperlipidemia Father   . Sleep apnea Father   . Obesity Father     ROS: Review of Systems  Constitutional: Positive for weight loss.    PHYSICAL EXAM: Blood pressure 111/77, pulse 80, temperature 98.4 F (36.9 C), height 5\' 7"  (1.702 m), weight 253 lb (114.8 kg), SpO2 99 %. Body mass index is 39.63 kg/m. Physical Exam  Constitutional: She is oriented to person, place, and time. She appears well-developed and well-nourished.  Cardiovascular: Normal rate.  Pulmonary/Chest: Effort normal.  Musculoskeletal: Normal range of motion.  Neurological: She is oriented to person, place, and time.  Skin: Skin is warm and dry.  Psychiatric: She has a normal mood and affect. Her behavior is normal.  Vitals reviewed.   RECENT LABS AND TESTS: BMET    Component Value Date/Time   NA 139 12/26/2016 0957   K 4.5 12/26/2016 0957   CL 105 12/26/2016 0957   CO2 21 12/26/2016 0957   GLUCOSE 86 12/26/2016 0957   GLUCOSE 127 (H) 11/16/2014 1625   BUN 10 12/26/2016 0957   CREATININE 0.70 12/26/2016 0957   CREATININE 0.62 01/20/2014 1317   CALCIUM 9.5 12/26/2016 0957   GFRNONAA 109 12/26/2016 0957   GFRAA 125 12/26/2016 0957   Lab Results  Component Value Date   HGBA1C 5.3 12/26/2016   HGBA1C 5.5 02/22/2016   Lab Results  Component Value Date   INSULIN 18.1 12/26/2016   CBC    Component Value Date/Time   WBC 6.7 12/26/2016 0957     WBC 8.9 11/16/2014 1625   RBC 4.64 12/26/2016 0957   RBC 4.34 11/16/2014 1625   HGB 12.9 12/26/2016 0957   HCT 39.7 12/26/2016 0957   PLT 389 (H) 01/26/2015 0823   MCV 86 12/26/2016 0957   MCH 27.8 12/26/2016 0957   MCH 27.0 11/16/2014 1625   MCHC 32.5 12/26/2016 0957   MCHC 31.8 11/16/2014 1625   RDW 15.2 12/26/2016 0957   LYMPHSABS 1.7 12/26/2016 0957   EOSABS 0.1 12/26/2016 0957   BASOSABS 0.0 12/26/2016 0957   Iron/TIBC/Ferritin/ %Sat    Component Value Date/Time  FERRITIN 21 02/22/2016 0823   Lipid Panel     Component Value Date/Time   CHOL 137 12/26/2016 0957   TRIG 51 12/26/2016 0957   HDL 33 (L) 12/26/2016 0957   CHOLHDL 3.6 02/22/2016 0823   CHOLHDL 4.6 01/20/2014 1317   VLDL 22 01/20/2014 1317   LDLCALC 94 12/26/2016 0957   Hepatic Function Panel     Component Value Date/Time   PROT 7.3 12/26/2016 0957   ALBUMIN 4.2 12/26/2016 0957   AST 19 12/26/2016 0957   ALT 33 (H) 12/26/2016 0957   ALKPHOS 76 12/26/2016 0957   BILITOT 0.4 12/26/2016 0957   BILIDIR 0.09 02/22/2016 0823   IBILI 0.2 01/20/2014 1317      Component Value Date/Time   TSH 2.220 12/26/2016 0957   TSH 3.130 02/22/2016 0823   TSH 2.880 01/26/2015 0823    ASSESSMENT AND PLAN: PCOS (polycystic ovarian syndrome)  Class 2 severe obesity with serious comorbidity and body mass index (BMI) of 39.0 to 39.9 in adult, unspecified obesity type (HCC)  PLAN:  Polycystic Ovary Syndrome Seirra agrees to continue taking her medications as prescribed. She agrees to follow up with our clinic in 2 weeks.  We spent > than 50% of the 15 minute visit on the counseling as documented in the note.  Obesity Dorlisa is currently in the action stage of change. As such, her goal is to continue with weight loss efforts She has agreed to keep a food journal with 1500 calories and 90 grams of protein daily Alexya has been instructed to work up to a goal of 150 minutes of combined cardio and strengthening  exercise per week for weight loss and overall health benefits. We discussed the following Behavioral Modification Strategies today: increasing lean protein intake and holiday eating strategies    Haeven has agreed to follow up with our clinic in 2 weeks. She was informed of the importance of frequent follow up visits to maximize her success with intensive lifestyle modifications for her multiple health conditions.  I, Burt Knack, am acting as transcriptionist for Illa Level, PA-C  I have reviewed the above documentation for accuracy and completeness, and I agree with the above. -Illa Level, PA-C  I have reviewed the above note and agree with the plan. -Quillian Quince, MD     Today's visit was # 6 out of 22.  Starting weight: 260 lbs Starting date: 12/26/16 Today's weight : 253 lbs  Today's date: 03/26/2017 Total lbs lost to date: 7 (Patients must lose 7 lbs in the first 6 months to continue with counseling)   ASK: We discussed the diagnosis of obesity with Arrietty S Pavelko today and Chayna agreed to give [place] permission to discuss obesity behavioral modification therapy today.  ASSESS: Damiya has the diagnosis of obesity and her BMI today is 39.62 Amandeep is in the action stage of change   ADVISE: Cara was educated on the multiple health risks of obesity as well as the benefit of weight loss to improve her health. She was advised of the need for long term treatment and the importance of lifestyle modifications.  AGREE: Multiple dietary modification options and treatment options were discussed and  Yuritza agreed to keep a food journal with 1500 calories and 90 grams of protein daily We discussed the following Behavioral Modification Strategies today: increasing lean protein intake and holiday eating strategies

## 2017-04-07 ENCOUNTER — Other Ambulatory Visit: Payer: Self-pay | Admitting: Nurse Practitioner

## 2017-04-07 MED ORDER — LIRAGLUTIDE 18 MG/3ML ~~LOC~~ SOPN: PEN_INJECTOR | SUBCUTANEOUS | 1 refills | Status: DC

## 2017-04-07 MED FILL — METFORMIN HCL ER 500 MG TAB: 500 | 90 days supply | Qty: 180 | Fill #1

## 2017-04-07 MED FILL — HYDROCHLOROTHIAZIDE 25 MG T: 25 | 90 days supply | Qty: 90 | Fill #1

## 2017-04-07 MED FILL — VICTOZA 18 MG/3 ML INJECT P: 18 | 30 days supply | Qty: 9 | Fill #0

## 2017-04-09 ENCOUNTER — Ambulatory Visit (INDEPENDENT_AMBULATORY_CARE_PROVIDER_SITE_OTHER): Payer: 59 | Admitting: Family Medicine

## 2017-04-09 VITALS — BP 113/75 | HR 74 | Temp 98.9°F | Ht 67.0 in | Wt 252.0 lb

## 2017-04-09 DIAGNOSIS — E282 Polycystic ovarian syndrome: Secondary | ICD-10-CM

## 2017-04-09 DIAGNOSIS — Z6839 Body mass index (BMI) 39.0-39.9, adult: Secondary | ICD-10-CM

## 2017-04-09 NOTE — Progress Notes (Signed)
Office: [phone]  /  Fax: [phone]   HPI:   Chief Complaint: OBESITY Christina Rodriguez is here to discuss her progress with her obesity treatment plan. She is on the keep a food journal with 1500 calories and 90 grams of protein daily and is following her eating plan approximately 95 % of the time. She states she is walking for 30 minutes 4 times per week. Christina Rodriguez continues to lose weight even over Thanksgiving. She isn't journaling much but working on portion control and [employer] choices, increasing lean protein, and decreasing simple carbohydrates.  Her weight is 252 lb (114.3 kg) today and has had a weight loss of 1 pound over a period of 2 weeks since her last visit. She has lost 8 lbs since starting treatment with [place].  Polycystic Ovary Syndrome Christina Rodriguez has an IUD and is on metformin and Victoza by her OBGYN.  ALLERGIES: Allergies  Allergen Reactions  . Beta Adrenergic Blockers Shortness Of Breath  . Shrimp [Shellfish Allergy] Hives  . Soybeans Hives    MEDICATIONS: Current Outpatient Medications on File Prior to Visit  Medication Sig Dispense Refill  . albuterol (PROVENTIL HFA;VENTOLIN HFA) 108 (90 BASE) MCG/ACT inhaler Inhale 2 puffs into the lungs every 4 (four) hours as needed for wheezing or shortness of breath. 1 Inhaler 5  . cyanocobalamin 500 MCG tablet Take 500 mcg by mouth daily.    Marland Kitchen diltiazem (CARDIZEM CD) 240 MG 24 hr capsule Take 1 capsule (240 mg total) by mouth daily. 90 capsule 1  . EPINEPHrine 0.3 mg/0.3 mL IJ SOAJ injection Inject 0.3 mg into the muscle once. Reported on 05/03/2015    . Fluticasone-Salmeterol (ADVAIR DISKUS) 250-50 MCG/DOSE AEPB Inhale 1 puff into the lungs 2 (two) times daily.      . hydrochlorothiazide (HYDRODIURIL) 25 MG tablet Take 1 tablet (25 mg total) by mouth daily. 90 tablet 1  . ibuprofen (ADVIL,MOTRIN) 200 MG tablet Take 400 mg by mouth every 6 (six) hours as needed for headache, mild pain or moderate pain.    . Insulin Pen  Needle (PEN NEEDLES) 32G X 5 MM MISC Inject 1.8 mg into the skin daily. 100 each 0  . liraglutide (VICTOZA) 18 MG/3ML SOPN Take  1.8 mg subcu qd 9 pen 1  . metFORMIN (GLUCOPHAGE-XR) 500 MG 24 hr tablet Take 2 tablets (1,000 mg total) by mouth daily with breakfast. 180 tablet 1  . Multiple Vitamins-Minerals (MULTIVITAMIN ADULT PO) Take 1 tablet by mouth daily.    . pantoprazole (PROTONIX) 40 MG tablet Take 1 tablet (40 mg total) by mouth daily. 90 tablet 1  . rizatriptan (MAXALT-MLT) 10 MG disintegrating tablet Take 1 tablet (10 mg total) by mouth as needed for migraine. May repeat in 2 hours if needed; max 2 per 24 hours 27 tablet 1  . Vitamin D, Ergocalciferol, (DRISDOL) 50000 units CAPS capsule Take 1 capsule (50,000 Units total) by mouth every 7 (seven) days. 4 capsule 0   No current facility-administered medications on file prior to visit.     PAST MEDICAL HISTORY: Past Medical History:  Diagnosis Date  . Adenomyosis   . Anemia    HIstory of   . Asthma    albuterol inhaler prn-recent uri-using more freq  . Dysrhythmia    history of svt-controlled on cardizem-diagnosed in 1999-  . Gall bladder disease   . GERD (gastroesophageal reflux disease)    uses protonix  . Headache    migraines   . History of polycystic ovarian  disease   . Infertility associated with anovulation   . Metabolic syndrome   . Multiple food allergies    shrimp  . Osteoarthritis   . Palpitations   . PCOS (polycystic ovarian syndrome)   . PONV (postoperative nausea and vomiting)   . Prediabetes   . Recurrent upper respiratory infection (URI)    resolving  . SVT (supraventricular tachycardia) (HCC)    History of   . Vitamin D deficiency   . VSD (ventricular septal defect)    history of -no problems    PAST SURGICAL HISTORY: Past Surgical History:  Procedure Laterality Date  . CESAREAN SECTION  2010  . CESAREAN SECTION  11/16/2010   Procedure: CESAREAN SECTION;  Surgeon: Almon HerculesKendra H. Ross;  Location: [facility];  Service: Gynecology;  Laterality: N/A;  Repeat   . CHOLECYSTECTOMY  2003  . DILATION AND CURETTAGE OF UTERUS    . HYSTEROSCOPY W/D&C N/A 12/06/2014   Procedure: DILATATION AND CURETTAGE /HYSTEROSCOPY;  Surgeon: [name], MD;  Location: [facility];  Service: Gynecology;  Laterality: N/A;  . uterine polyps      SOCIAL HISTORY: Social History   Tobacco Use  . Smoking status: Never Smoker  . Smokeless tobacco: Never Used  Substance Use Topics  . Alcohol use: No  . Drug use: No    FAMILY HISTORY: Family History  Problem Relation Age of Onset  . Diabetes Father   . Hypertension Father   . Hyperlipidemia Father   . Sleep apnea Father   . Obesity Father     ROS: Review of Systems  Constitutional: Positive for weight loss.    PHYSICAL EXAM: Blood pressure 113/75, pulse 74, temperature 98.9 F (37.2 C), temperature source Oral, height 5\' 7"  (1.702 m), weight 252 lb (114.3 kg), SpO2 98 %. Body mass index is 39.47 kg/m. Physical Exam  Constitutional: She is oriented to person, place, and time. She appears well-developed and well-nourished.  Cardiovascular: Normal rate.  Pulmonary/Chest: Effort normal.  Musculoskeletal: Normal range of motion.  Neurological: She is oriented to person, place, and time.  Skin: Skin is warm and dry.  Psychiatric: She has a normal mood and affect. Her behavior is normal.  Vitals reviewed.   RECENT LABS AND TESTS: BMET    Component Value Date/Time   NA 139 12/26/2016 0957   K 4.5 12/26/2016 0957   CL 105 12/26/2016 0957   CO2 21 12/26/2016 0957   GLUCOSE 86 12/26/2016 0957   GLUCOSE 127 (H) 11/16/2014 1625   BUN 10 12/26/2016 0957   CREATININE 0.70 12/26/2016 0957   CREATININE 0.62 01/20/2014 1317   CALCIUM 9.5 12/26/2016 0957   GFRNONAA 109 12/26/2016 0957   GFRAA 125 12/26/2016 0957   Lab Results  Component Value Date   HGBA1C 5.3 12/26/2016   HGBA1C 5.5 02/22/2016   Lab Results  Component Value Date   INSULIN 18.1  12/26/2016   CBC    Component Value Date/Time   WBC 6.7 12/26/2016 0957   WBC 8.9 11/16/2014 1625   RBC 4.64 12/26/2016 0957   RBC 4.34 11/16/2014 1625   HGB 12.9 12/26/2016 0957   HCT 39.7 12/26/2016 0957   PLT 389 (H) 01/26/2015 0823   MCV 86 12/26/2016 0957   MCH 27.8 12/26/2016 0957   MCH 27.0 11/16/2014 1625   MCHC 32.5 12/26/2016 0957   MCHC 31.8 11/16/2014 1625   RDW 15.2 12/26/2016 0957   LYMPHSABS 1.7 12/26/2016 0957   EOSABS 0.1 12/26/2016 0957   BASOSABS  0.0 12/26/2016 0957   Iron/TIBC/Ferritin/ %Sat    Component Value Date/Time   FERRITIN 21 02/22/2016 0823   Lipid Panel     Component Value Date/Time   CHOL 137 12/26/2016 0957   TRIG 51 12/26/2016 0957   HDL 33 (L) 12/26/2016 0957   CHOLHDL 3.6 02/22/2016 0823   CHOLHDL 4.6 01/20/2014 1317   VLDL 22 01/20/2014 1317   LDLCALC 94 12/26/2016 0957   Hepatic Function Panel     Component Value Date/Time   PROT 7.3 12/26/2016 0957   ALBUMIN 4.2 12/26/2016 0957   AST 19 12/26/2016 0957   ALT 33 (H) 12/26/2016 0957   ALKPHOS 76 12/26/2016 0957   BILITOT 0.4 12/26/2016 0957   BILIDIR 0.09 02/22/2016 0823   IBILI 0.2 01/20/2014 1317      Component Value Date/Time   TSH 2.220 12/26/2016 0957   TSH 3.130 02/22/2016 0823   TSH 2.880 01/26/2015 0823    ASSESSMENT AND PLAN: PCOS (polycystic ovarian syndrome)  Class 2 severe obesity with serious comorbidity and body mass index (BMI) of 39.0 to 39.9 in adult, unspecified obesity type (HCC)  PLAN:  Polycystic Ovary Syndrome Christina Rodriguez agrees to continue diet and exercise, and continue her medications as prescribed. Christina Rodriguez agrees to follow up with our clinic in 2 weeks and we plan to recheck labs at that time.  We spent > than 50% of the 15 minute visit on the counseling as documented in the note.  Obesity Christina Rodriguez is currently in the action stage of change. As such, her goal is to continue with weight loss efforts She has agreed to change to portion control  better and make smarter food choices, such as increase vegetables and decrease simple carbohydrates  Christina Rodriguez has been instructed to work up to a goal of 150 minutes of combined cardio and strengthening exercise per week for weight loss and overall health benefits. We discussed the following Behavioral Modification Strategies today: increasing lean protein intake, work on meal planning and easy cooking plans and dealing with family or coworker sabotage   Christina Rodriguez has agreed to follow up with our clinic in 2 weeks. She was informed of the importance of frequent follow up visits to maximize her success with intensive lifestyle modifications for her multiple health conditions.  I, Burt Knack, am acting as transcriptionist for Quillian Quince, MD  I have reviewed the above documentation for accuracy and completeness, and I agree with the above. -Quillian Quince, MD     Today's visit was # 7 out of 22.  Starting weight: 260 lbs Starting date: 12/26/16 Today's weight : 252 lbs  Today's date: 04/09/2017 Total lbs lost to date: 8 (Patients must lose 7 lbs in the first 6 months to continue with counseling)   ASK: We discussed the diagnosis of obesity with Christina Rodriguez today and Christina Rodriguez agreed to give [place] permission to discuss obesity behavioral modification therapy today.  ASSESS: Christina Rodriguez has the diagnosis of obesity and her BMI today is 39.46 Christina Rodriguez is in the action stage of change   ADVISE: Christina Rodriguez was educated on the multiple health risks of obesity as well as the benefit of weight loss to improve her health. She was advised of the need for long term treatment and the importance of lifestyle modifications.  AGREE: Multiple dietary modification options and treatment options were discussed and  Christina Rodriguez agreed to portion control better and make smarter food choices, such as increase vegetables and decrease simple carbohydrates  We discussed the following Behavioral  Modification Strategies today:  increasing lean protein intake, work on meal planning and easy cooking plans and dealing with family or coworker sabotage

## 2017-04-23 ENCOUNTER — Ambulatory Visit (INDEPENDENT_AMBULATORY_CARE_PROVIDER_SITE_OTHER): Payer: 59 | Admitting: Family Medicine

## 2017-04-23 VITALS — BP 109/73 | HR 74 | Temp 98.4°F | Ht 67.0 in | Wt 252.0 lb

## 2017-04-23 DIAGNOSIS — Z6839 Body mass index (BMI) 39.0-39.9, adult: Secondary | ICD-10-CM | POA: Diagnosis not present

## 2017-04-23 DIAGNOSIS — R7989 Other specified abnormal findings of blood chemistry: Secondary | ICD-10-CM

## 2017-04-23 DIAGNOSIS — E559 Vitamin D deficiency, unspecified: Secondary | ICD-10-CM | POA: Diagnosis not present

## 2017-04-23 DIAGNOSIS — R945 Abnormal results of liver function studies: Secondary | ICD-10-CM | POA: Diagnosis not present

## 2017-04-23 DIAGNOSIS — E282 Polycystic ovarian syndrome: Secondary | ICD-10-CM

## 2017-04-23 DIAGNOSIS — E66812 Obesity, class 2: Secondary | ICD-10-CM

## 2017-04-23 DIAGNOSIS — Z9189 Other specified personal risk factors, not elsewhere classified: Secondary | ICD-10-CM

## 2017-04-23 NOTE — Progress Notes (Signed)
Office: [phone]  /  Fax: [phone]   HPI:   Chief Complaint: OBESITY Christina Rodriguez is here to discuss her progress with her obesity treatment plan. She is on the portion control better and make smarter food choices, such as increase vegetables and decrease simple carbohydrates  and is following her eating plan approximately 80 % of the time. She states she is walking for 30 minutes 2 times per week. Christina Rodriguez has done well with weight loss with portion control/smarter food choices. She is not getting adequate protein but trying to decrease simple carbohydrates. Her hunger is controlled.  Her weight is 252 lb (114.3 kg) today and has not lost weight since her last visit. She has lost 8 lbs since starting treatment with [place].  Polycystic Ovary Syndrome Christina Rodriguez is attempting to diet control. She is on metformin and Victoza.  Vitamin D deficiency Christina Rodriguez has a diagnosis of vitamin D deficiency. She is on prescription Vit D, last level not yet at goal. She denies nausea, vomiting or muscle weakness.  Elevated LFT [name] has a new diagnosis of elevated ALT. This is likely NAFLD. She is attempting to improve with diet and weight loss and she is due for labs. She denies abdominal pain or jaundice and has never been told of any liver problems in the past. She denies excessive alcohol intake.  At risk for diabetes Christina Rodriguez is at higher than average risk for developing diabetes due to her obesity. She currently denies polyuria or polydipsia.  ALLERGIES: Allergies  Allergen Reactions  . Beta Adrenergic Blockers Shortness Of Breath  . Shrimp [Shellfish Allergy] Hives  . Soybeans Hives    MEDICATIONS: Current Outpatient Medications on File Prior to Visit  Medication Sig Dispense Refill  . albuterol (PROVENTIL HFA;VENTOLIN HFA) 108 (90 BASE) MCG/ACT inhaler Inhale 2 puffs into the lungs every 4 (four) hours as needed for wheezing or shortness of breath. 1 Inhaler 5  . cyanocobalamin 500 MCG tablet  Take 500 mcg by mouth daily.    Marland Kitchen diltiazem (CARDIZEM CD) 240 MG 24 hr capsule Take 1 capsule (240 mg total) by mouth daily. 90 capsule 1  . EPINEPHrine 0.3 mg/0.3 mL IJ SOAJ injection Inject 0.3 mg into the muscle once. Reported on 05/03/2015    . Fluticasone-Salmeterol (ADVAIR DISKUS) 250-50 MCG/DOSE AEPB Inhale 1 puff into the lungs 2 (two) times daily.      . hydrochlorothiazide (HYDRODIURIL) 25 MG tablet Take 1 tablet (25 mg total) by mouth daily. 90 tablet 1  . ibuprofen (ADVIL,MOTRIN) 200 MG tablet Take 400 mg by mouth every 6 (six) hours as needed for headache, mild pain or moderate pain.    . Insulin Pen Needle (PEN NEEDLES) 32G X 5 MM MISC Inject 1.8 mg into the skin daily. 100 each 0  . liraglutide (VICTOZA) 18 MG/3ML SOPN Take  1.8 mg subcu qd 9 pen 1  . metFORMIN (GLUCOPHAGE-XR) 500 MG 24 hr tablet Take 2 tablets (1,000 mg total) by mouth daily with breakfast. 180 tablet 1  . Multiple Vitamins-Minerals (MULTIVITAMIN ADULT PO) Take 1 tablet by mouth daily.    . pantoprazole (PROTONIX) 40 MG tablet Take 1 tablet (40 mg total) by mouth daily. 90 tablet 1  . rizatriptan (MAXALT-MLT) 10 MG disintegrating tablet Take 1 tablet (10 mg total) by mouth as needed for migraine. May repeat in 2 hours if needed; max 2 per 24 hours 27 tablet 1  . Vitamin D, Ergocalciferol, (DRISDOL) 50000 units CAPS capsule Take 1 capsule (50,000 Units total)  by mouth every 7 (seven) days. 4 capsule 0   No current facility-administered medications on file prior to visit.     PAST MEDICAL HISTORY: Past Medical History:  Diagnosis Date  . Adenomyosis   . Anemia    HIstory of   . Asthma    albuterol inhaler prn-recent uri-using more freq  . Dysrhythmia    history of svt-controlled on cardizem-diagnosed in 1999-  . Gall bladder disease   . GERD (gastroesophageal reflux disease)    uses protonix  . Headache    migraines   . History of polycystic ovarian disease   . Infertility associated with anovulation     . Metabolic syndrome   . Multiple food allergies    shrimp  . Osteoarthritis   . Palpitations   . PCOS (polycystic ovarian syndrome)   . PONV (postoperative nausea and vomiting)   . Prediabetes   . Recurrent upper respiratory infection (URI)    resolving  . SVT (supraventricular tachycardia) (HCC)    History of   . Vitamin D deficiency   . VSD (ventricular septal defect)    history of -no problems    PAST SURGICAL HISTORY: Past Surgical History:  Procedure Laterality Date  . CESAREAN SECTION  2010  . CESAREAN SECTION  11/16/2010   Procedure: CESAREAN SECTION;  Surgeon: Almon HerculesKendra H. Ross;  Location: [facility];  Service: Gynecology;  Laterality: N/A;  Repeat   . CHOLECYSTECTOMY  2003  . DILATION AND CURETTAGE OF UTERUS    . HYSTEROSCOPY W/D&C N/A 12/06/2014   Procedure: DILATATION AND CURETTAGE /HYSTEROSCOPY;  Surgeon: [name], MD;  Location: [facility];  Service: Gynecology;  Laterality: N/A;  . uterine polyps      SOCIAL HISTORY: Social History   Tobacco Use  . Smoking status: Never Smoker  . Smokeless tobacco: Never Used  Substance Use Topics  . Alcohol use: No  . Drug use: No    FAMILY HISTORY: Family History  Problem Relation Age of Onset  . Diabetes Father   . Hypertension Father   . Hyperlipidemia Father   . Sleep apnea Father   . Obesity Father     ROS: Review of Systems  Constitutional: Negative for weight loss.  Eyes:       Negative Jaundice  Gastrointestinal: Negative for abdominal pain, nausea and vomiting.  Genitourinary: Negative for frequency.  Musculoskeletal:       Negative muscle weakness  Endo/Heme/Allergies: Negative for polydipsia.    PHYSICAL EXAM: Blood pressure 109/73, pulse 74, temperature 98.4 F (36.9 C), temperature source Oral, height 5\' 7"  (1.702 m), weight 252 lb (114.3 kg), SpO2 99 %. Body mass index is 39.47 kg/m. Physical Exam  Constitutional: She is oriented to person, place, and time. She appears well-developed and  well-nourished.  Cardiovascular: Normal rate.  Pulmonary/Chest: Effort normal.  Musculoskeletal: Normal range of motion.  Neurological: She is oriented to person, place, and time.  Skin: Skin is warm and dry.  Psychiatric: She has a normal mood and affect. Her behavior is normal.  Vitals reviewed.   RECENT LABS AND TESTS: BMET    Component Value Date/Time   NA 139 12/26/2016 0957   K 4.5 12/26/2016 0957   CL 105 12/26/2016 0957   CO2 21 12/26/2016 0957   GLUCOSE 86 12/26/2016 0957   GLUCOSE 127 (H) 11/16/2014 1625   BUN 10 12/26/2016 0957   CREATININE 0.70 12/26/2016 0957   CREATININE 0.62 01/20/2014 1317   CALCIUM 9.5 12/26/2016 0957   GFRNONAA  109 12/26/2016 0957   GFRAA 125 12/26/2016 0957   Lab Results  Component Value Date   HGBA1C 5.3 12/26/2016   HGBA1C 5.5 02/22/2016   Lab Results  Component Value Date   INSULIN 18.1 12/26/2016   CBC    Component Value Date/Time   WBC 6.7 12/26/2016 0957   WBC 8.9 11/16/2014 1625   RBC 4.64 12/26/2016 0957   RBC 4.34 11/16/2014 1625   HGB 12.9 12/26/2016 0957   HCT 39.7 12/26/2016 0957   PLT 389 (H) 01/26/2015 0823   MCV 86 12/26/2016 0957   MCH 27.8 12/26/2016 0957   MCH 27.0 11/16/2014 1625   MCHC 32.5 12/26/2016 0957   MCHC 31.8 11/16/2014 1625   RDW 15.2 12/26/2016 0957   LYMPHSABS 1.7 12/26/2016 0957   EOSABS 0.1 12/26/2016 0957   BASOSABS 0.0 12/26/2016 0957   Iron/TIBC/Ferritin/ %Sat    Component Value Date/Time   FERRITIN 21 02/22/2016 0823   Lipid Panel     Component Value Date/Time   CHOL 137 12/26/2016 0957   TRIG 51 12/26/2016 0957   HDL 33 (L) 12/26/2016 0957   CHOLHDL 3.6 02/22/2016 0823   CHOLHDL 4.6 01/20/2014 1317   VLDL 22 01/20/2014 1317   LDLCALC 94 12/26/2016 0957   Hepatic Function Panel     Component Value Date/Time   PROT 7.3 12/26/2016 0957   ALBUMIN 4.2 12/26/2016 0957   AST 19 12/26/2016 0957   ALT 33 (H) 12/26/2016 0957   ALKPHOS 76 12/26/2016 0957   BILITOT 0.4  12/26/2016 0957   BILIDIR 0.09 02/22/2016 0823   IBILI 0.2 01/20/2014 1317      Component Value Date/Time   TSH 2.220 12/26/2016 0957   TSH 3.130 02/22/2016 0823   TSH 2.880 01/26/2015 0823    ASSESSMENT AND PLAN: PCOS (polycystic ovarian syndrome) - Plan: Comprehensive metabolic panel, Hemoglobin A1c, Insulin, random  Vitamin D deficiency - Plan: VITAMIN D 25 Hydroxy (Vit-D Deficiency, Fractures)  Elevated LFTs - Plan: Lipid Panel With LDL/HDL Ratio  At risk for diabetes mellitus  Class 2 severe obesity with serious comorbidity and body mass index (BMI) of 39.0 to 39.9 in adult, unspecified obesity type (HCC)  PLAN:  Polycystic Ovary Syndrome We will check labs and Analiyah agrees to follow up with our clinic in 3 weeks.  Vitamin D Deficiency Rebie was informed that low vitamin D levels contributes to fatigue and are associated with obesity, breast, and colon cancer. Kimyah agrees to continue taking prescription Vit D @50 ,000 IU every week #4 and will follow up for routine testing of vitamin D, at least 2-3 times per year. She was informed of the risk of over-replacement of vitamin D and agrees to not increase her dose unless he discusses this with [place] first. We will check labs and Jaeanna agrees to follow up with our clinic in 3 weeks.   Elevated LFT We discussed the likely diagnosis of non alcoholic fatty liver disease today and how this condition is obesity related. Ardene was educated on her risk of developing NASH or even liver failure and th only proven treatment for NAFLD was weight loss. Ulah agrees to continue with her weight loss efforts with healthier diet and exercise as an essential part of her treatment plan. We will check labs and Kortni agrees to follow up with our clinic in 3 weeks.  Diabetes risk counselling Sheilah was given extended (15 minutes) diabetes prevention counseling today. She is 41 y.o. female and has risk factors for diabetes  including obesity. We  discussed intensive lifestyle modifications today with an emphasis on weight loss as well as increasing exercise and decreasing simple carbohydrates in her diet.  Obesity Celie is currently in the action stage of change. As such, her goal is to continue with weight loss efforts She has agreed to portion control better and make smarter food choices, such as increase vegetables and decrease simple carbohydrates  Clairessa has been instructed to work up to a goal of 150 minutes of combined cardio and strengthening exercise per week for weight loss and overall health benefits. We discussed the following Behavioral Modification Strategies today: increasing lean protein intake, dealing with family or coworker sabotage and holiday eating strategies    Farin has agreed to follow up with our clinic in 3 weeks. She was informed of the importance of frequent follow up visits to maximize her success with intensive lifestyle modifications for her multiple health conditions.  I, Burt KnackSharon Martin, am acting as transcriptionist for Quillian Quincearen Maison Agrusa, MD  I have reviewed the above documentation for accuracy and completeness, and I agree with the above. -Quillian Quincearen Travon Crochet, MD     Today's visit was # 8 out of 22.  Starting weight: 260 lbs Starting date: 12/26/16 Today's weight : 252 lbs  Today's date: 04/23/2017 Total lbs lost to date: 8 (Patients must lose 7 lbs in the first 6 months to continue with counseling)   ASK: We discussed the diagnosis of obesity with Casimira S Tassin today and Berkleigh agreed to give us permission to discuss obesity behavioral modification therapy today.  ASSESS: Keyira has the diagnosis of obesity and her BMI today is 39.46 Kerrilynn is in the action stage of change   ADVISE: Laveta was educated on the multiple health risks of obesity as well as the benefit of weight loss to improve her health. She was advised of the need for long term treatment and the importance of lifestyle  modifications.  AGREE: Multiple dietary modification options and treatment options were discussed and  Kadian agreed to portion control better and make smarter food choices, such as increase vegetables and decrease simple carbohydrates  We discussed the following Behavioral Modification Strategies today: increasing lean protein intake, dealing with family or coworker sabotage and holiday eating strategies

## 2017-04-24 LAB — COMPREHENSIVE METABOLIC PANEL
A/G RATIO: 1.3 (ref 1.2–2.2)
ALT: 17 IU/L (ref 0–32)
AST: 16 IU/L (ref 0–40)
Albumin: 3.9 g/dL (ref 3.5–5.5)
Alkaline Phosphatase: 65 IU/L (ref 39–117)
BILIRUBIN TOTAL: 0.3 mg/dL (ref 0.0–1.2)
BUN/Creatinine Ratio: 19 (ref 9–23)
BUN: 13 mg/dL (ref 6–24)
CALCIUM: 9.1 mg/dL (ref 8.7–10.2)
CHLORIDE: 102 mmol/L (ref 96–106)
CO2: 26 mmol/L (ref 20–29)
Creatinine, Ser: 0.68 mg/dL (ref 0.57–1.00)
GFR, EST AFRICAN AMERICAN: 126 mL/min/{1.73_m2} (ref >59)
GFR, EST NON AFRICAN AMERICAN: 109 mL/min/{1.73_m2} (ref >59)
GLOBULIN, TOTAL: 2.9 g/dL (ref 1.5–4.5)
Glucose: 82 mg/dL (ref 65–99)
POTASSIUM: 4.3 mmol/L (ref 3.5–5.2)
SODIUM: 140 mmol/L (ref 134–144)
TOTAL PROTEIN: 6.8 g/dL (ref 6.0–8.5)

## 2017-04-24 LAB — HEMOGLOBIN A1C
Est. average glucose Bld gHb Est-mCnc: 111 mg/dL
Hgb A1c MFr Bld: 5.5 % (ref 4.8–5.6)

## 2017-04-24 LAB — LIPID PANEL WITH LDL/HDL RATIO
Cholesterol, Total: 147 mg/dL (ref 100–199)
HDL: 33 mg/dL — AB (ref >39)
LDL Calculated: 99 mg/dL (ref 0–99)
LDL/HDL RATIO: 3 ratio (ref 0.0–3.2)
Triglycerides: 75 mg/dL (ref 0–149)
VLDL CHOLESTEROL CAL: 15 mg/dL (ref 5–40)

## 2017-04-24 LAB — INSULIN, RANDOM: INSULIN: 24.6 u[IU]/mL (ref 2.6–24.9)

## 2017-04-24 LAB — VITAMIN D 25 HYDROXY (VIT D DEFICIENCY, FRACTURES): VIT D 25 HYDROXY: 22.3 ng/mL — AB (ref 30.0–100.0)

## 2017-05-01 MED FILL — VICTOZA 18 MG/3 ML INJECT P: 18 | 90 days supply | Qty: 27 | Fill #1

## 2017-05-02 ENCOUNTER — Other Ambulatory Visit: Payer: Self-pay | Admitting: Nurse Practitioner

## 2017-05-02 MED ORDER — FLUCONAZOLE 150 MG PO TABS: ORAL_TABLET | ORAL | 0 refills | Status: DC

## 2017-05-02 MED ORDER — AZITHROMYCIN 250 MG PO TABS: ORAL_TABLET | ORAL | 0 refills | Status: DC

## 2017-05-02 NOTE — Progress Notes (Signed)
Patient requesting antibiotic for sore throat. 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 Office visit if no improvement.

## 2017-05-20 ENCOUNTER — Ambulatory Visit (INDEPENDENT_AMBULATORY_CARE_PROVIDER_SITE_OTHER): Payer: 59 | Admitting: Family Medicine

## 2017-05-21 ENCOUNTER — Ambulatory Visit (INDEPENDENT_AMBULATORY_CARE_PROVIDER_SITE_OTHER): Payer: 59 | Admitting: Family Medicine

## 2017-05-21 VITALS — BP 117/77 | HR 86 | Temp 98.0°F | Ht 67.0 in | Wt 251.0 lb

## 2017-05-21 DIAGNOSIS — Z6839 Body mass index (BMI) 39.0-39.9, adult: Secondary | ICD-10-CM

## 2017-05-21 DIAGNOSIS — E282 Polycystic ovarian syndrome: Secondary | ICD-10-CM | POA: Diagnosis not present

## 2017-05-21 DIAGNOSIS — E559 Vitamin D deficiency, unspecified: Secondary | ICD-10-CM

## 2017-05-21 DIAGNOSIS — Z9189 Other specified personal risk factors, not elsewhere classified: Secondary | ICD-10-CM

## 2017-05-21 MED ORDER — VITAMIN D (ERGOCALCIFEROL) 1.25 MG (50000 UNIT) PO CAPS: 50000.0000 [IU] | ORAL_CAPSULE | ORAL | 0 refills | Status: DC

## 2017-05-21 MED FILL — VIT D2 1.25 MG (50,000 UNIT: 1.25 MG | 28 days supply | Qty: 4 | Fill #0

## 2017-05-21 NOTE — Progress Notes (Signed)
Office: [phone]  /  Fax: [phone]   HPI:   Chief Complaint: OBESITY Christina Rodriguez is here to discuss her progress with her obesity treatment plan. She is on the keep a food journal with 1500 calories and 90 grams of protein daily and is following her eating plan approximately 90 % of the time. She states she is walking for 30 minutes 3 times per week. Christina Rodriguez is journaling, sometimes finishing journal in her mind most days. She is eating what kids are eating, having a hard time getting protein in. She is still walking at church inside.  Her weight is 251 lb (113.9 kg) today and has had a weight loss of 1 pound over a period of 4 weeks since her last visit. She has lost 9 lbs since starting treatment with [place].  Vitamin D deficiency Christina Rodriguez has a diagnosis of vitamin D deficiency. She is currently taking prescription Vit D, level is low on recent labs. She denies nausea, vomiting or muscle weakness.  Polycystic Ovary Syndrome Christina Rodriguez is on metformin and Victoza. She denies polyphagia or hypoglycemia. May consider Saxenda if she needs more help with appetite control and insulin resistance.  At risk for diabetes Christina Rodriguez is at higher than average risk for developing diabetes due to her obesity. She currently denies polyuria or polydipsia.  ALLERGIES: Allergies  Allergen Reactions  . Beta Adrenergic Blockers Shortness Of Breath  . Shrimp [Shellfish Allergy] Hives  . Soybeans Hives    MEDICATIONS: Current Outpatient Medications on File Prior to Visit  Medication Sig Dispense Refill  . albuterol (PROVENTIL HFA;VENTOLIN HFA) 108 (90 BASE) MCG/ACT inhaler Inhale 2 puffs into the lungs every 4 (four) hours as needed for wheezing or shortness of breath. 1 Inhaler 5  . azithromycin (ZITHROMAX Z-PAK) 250 MG tablet Take 2 tablets (500 mg) on  Day 1,  followed by 1 tablet (250 mg) once daily on Days 2 through 5. 6 each 0  . cyanocobalamin 500 MCG tablet Take 500 mcg by mouth daily.    Marland Kitchen diltiazem  (CARDIZEM CD) 240 MG 24 hr capsule Take 1 capsule (240 mg total) by mouth daily. 90 capsule 1  . EPINEPHrine 0.3 mg/0.3 mL IJ SOAJ injection Inject 0.3 mg into the muscle once. Reported on 05/03/2015    . fluconazole (DIFLUCAN) 150 MG tablet One po qd prn yeast infection; may repeat in 3-4 days if needed 2 tablet 0  . Fluticasone-Salmeterol (ADVAIR DISKUS) 250-50 MCG/DOSE AEPB Inhale 1 puff into the lungs 2 (two) times daily.      . hydrochlorothiazide (HYDRODIURIL) 25 MG tablet Take 1 tablet (25 mg total) by mouth daily. 90 tablet 1  . ibuprofen (ADVIL,MOTRIN) 200 MG tablet Take 400 mg by mouth every 6 (six) hours as needed for headache, mild pain or moderate pain.    . Insulin Pen Needle (PEN NEEDLES) 32G X 5 MM MISC Inject 1.8 mg into the skin daily. 100 each 0  . liraglutide (VICTOZA) 18 MG/3ML SOPN Take  1.8 mg subcu qd 9 pen 1  . metFORMIN (GLUCOPHAGE-XR) 500 MG 24 hr tablet Take 2 tablets (1,000 mg total) by mouth daily with breakfast. 180 tablet 1  . Multiple Vitamins-Minerals (MULTIVITAMIN ADULT PO) Take 1 tablet by mouth daily.    . pantoprazole (PROTONIX) 40 MG tablet Take 1 tablet (40 mg total) by mouth daily. 90 tablet 1  . rizatriptan (MAXALT-MLT) 10 MG disintegrating tablet Take 1 tablet (10 mg total) by mouth as needed for migraine. May repeat in 2  hours if needed; max 2 per 24 hours 27 tablet 1   No current facility-administered medications on file prior to visit.     PAST MEDICAL HISTORY: Past Medical History:  Diagnosis Date  . Adenomyosis   . Anemia    HIstory of   . Asthma    albuterol inhaler prn-recent uri-using more freq  . Dysrhythmia    history of svt-controlled on cardizem-diagnosed in 1999-  . Gall bladder disease   . GERD (gastroesophageal reflux disease)    uses protonix  . Headache    migraines   . History of polycystic ovarian disease   . Infertility associated with anovulation   . Metabolic syndrome   . Multiple food allergies    shrimp  .  Osteoarthritis   . Palpitations   . PCOS (polycystic ovarian syndrome)   . PONV (postoperative nausea and vomiting)   . Prediabetes   . Recurrent upper respiratory infection (URI)    resolving  . SVT (supraventricular tachycardia) (HCC)    History of   . Vitamin D deficiency   . VSD (ventricular septal defect)    history of -no problems    PAST SURGICAL HISTORY: Past Surgical History:  Procedure Laterality Date  . CESAREAN SECTION  2010  . CESAREAN SECTION  11/16/2010   Procedure: CESAREAN SECTION;  Surgeon: Almon Hercules;  Location: [facility];  Service: Gynecology;  Laterality: N/A;  Repeat   . CHOLECYSTECTOMY  2003  . DILATION AND CURETTAGE OF UTERUS    . HYSTEROSCOPY W/D&C N/A 12/06/2014   Procedure: DILATATION AND CURETTAGE /HYSTEROSCOPY;  Surgeon: Waynard Reeds, MD;  Location: [facility];  Service: Gynecology;  Laterality: N/A;  . uterine polyps      SOCIAL HISTORY: Social History   Tobacco Use  . Smoking status: Never Smoker  . Smokeless tobacco: Never Used  Substance Use Topics  . Alcohol use: No  . Drug use: No    FAMILY HISTORY: Family History  Problem Relation Age of Onset  . Diabetes Father   . Hypertension Father   . Hyperlipidemia Father   . Sleep apnea Father   . Obesity Father     ROS: Review of Systems  Constitutional: Positive for weight loss.  Gastrointestinal: Negative for nausea and vomiting.  Genitourinary: Negative for frequency.  Musculoskeletal:       Negative muscle weakness  Endo/Heme/Allergies: Negative for polydipsia.       Negative polyphagia Negative hypoglycemia    PHYSICAL EXAM: Blood pressure 117/77, pulse 86, temperature 98 F (36.7 C), temperature source Oral, height 5\' 7"  (1.702 m), weight 251 lb (113.9 kg), SpO2 100 %. Body mass index is 39.31 kg/m. Physical Exam  Constitutional: She appears well-developed and well-nourished.  Cardiovascular: Normal rate.  Pulmonary/Chest: Effort normal.  Musculoskeletal: Normal range of  motion.  Skin: Skin is warm and dry.  Psychiatric: She has a normal mood and affect. Her behavior is normal.  Vitals reviewed.   RECENT LABS AND TESTS: BMET    Component Value Date/Time   NA 140 04/23/2017 0810   K 4.3 04/23/2017 0810   CL 102 04/23/2017 0810   CO2 26 04/23/2017 0810   GLUCOSE 82 04/23/2017 0810   GLUCOSE 127 (H) 11/16/2014 1625   BUN 13 04/23/2017 0810   CREATININE 0.68 04/23/2017 0810   CREATININE 0.62 01/20/2014 1317   CALCIUM 9.1 04/23/2017 0810   GFRNONAA 109 04/23/2017 0810   GFRAA 126 04/23/2017 0810   Lab Results  Component Value Date  HGBA1C 5.5 04/23/2017   HGBA1C 5.3 12/26/2016   HGBA1C 5.5 02/22/2016   Lab Results  Component Value Date   INSULIN 24.6 04/23/2017   INSULIN 18.1 12/26/2016   CBC    Component Value Date/Time   WBC 6.7 12/26/2016 0957   WBC 8.9 11/16/2014 1625   RBC 4.64 12/26/2016 0957   RBC 4.34 11/16/2014 1625   HGB 12.9 12/26/2016 0957   HCT 39.7 12/26/2016 0957   PLT 389 (H) 01/26/2015 0823   MCV 86 12/26/2016 0957   MCH 27.8 12/26/2016 0957   MCH 27.0 11/16/2014 1625   MCHC 32.5 12/26/2016 0957   MCHC 31.8 11/16/2014 1625   RDW 15.2 12/26/2016 0957   LYMPHSABS 1.7 12/26/2016 0957   EOSABS 0.1 12/26/2016 0957   BASOSABS 0.0 12/26/2016 0957   Iron/TIBC/Ferritin/ %Sat    Component Value Date/Time   FERRITIN 21 02/22/2016 0823   Lipid Panel     Component Value Date/Time   CHOL 147 04/23/2017 0810   TRIG 75 04/23/2017 0810   HDL 33 (L) 04/23/2017 0810   CHOLHDL 3.6 02/22/2016 0823   CHOLHDL 4.6 01/20/2014 1317   VLDL 22 01/20/2014 1317   LDLCALC 99 04/23/2017 0810   Hepatic Function Panel     Component Value Date/Time   PROT 6.8 04/23/2017 0810   ALBUMIN 3.9 04/23/2017 0810   AST 16 04/23/2017 0810   ALT 17 04/23/2017 0810   ALKPHOS 65 04/23/2017 0810   BILITOT 0.3 04/23/2017 0810   BILIDIR 0.09 02/22/2016 0823   IBILI 0.2 01/20/2014 1317      Component Value Date/Time   TSH 2.220 12/26/2016  0957   TSH 3.130 02/22/2016 0823   TSH 2.880 01/26/2015 0823  Results for JOYLEEN, HASELTON (MRN [id number]) as of 05/21/2017 16:12  Ref. Range 04/23/2017 08:10  Vitamin D, 25-Hydroxy Latest Ref Range: 30.0 - 100.0 ng/mL 22.3 (L)    ASSESSMENT AND PLAN: Vitamin D deficiency - Plan: Vitamin D, Ergocalciferol, (DRISDOL) 50000 units CAPS capsule  PCOS (polycystic ovarian syndrome)  At risk for diabetes mellitus  Class 2 severe obesity with serious comorbidity and body mass index (BMI) of 39.0 to 39.9 in adult, unspecified obesity type (HCC)  PLAN:  Vitamin D Deficiency Christina Rodriguez was informed that low vitamin D levels contributes to fatigue and are associated with obesity, breast, and colon cancer. She agrees to continue to take prescription Vit D @50 ,000 IU every week and will follow up for routine testing of vitamin D, at least 2-3 times per year. She was informed of the risk of over-replacement of vitamin D and agrees to not increase her dose unless she discusses this with [place] first.  Polycystic Ovary Syndrome Christina Rodriguez just received 90 days of her prescription. She will continue her current treatment and Christina Rodriguez agrees to follow up with our clinic in 3 weeks.  Diabetes risk counselling Christina Rodriguez was given extended (15 minutes) diabetes prevention counseling today. She is 42 y.o. female and has risk factors for diabetes including obesity. We discussed intensive lifestyle modifications today with an emphasis on weight loss as well as increasing exercise and decreasing simple carbohydrates in her diet.  Obesity Christina Rodriguez is currently in the action stage of change. As such, her goal is to continue with weight loss efforts She has agreed to keep a food journal with 1500 calories and 90 grams of protein daily Christina Rodriguez has been instructed to work up to a goal of 150 minutes of combined cardio and strengthening exercise per week for weight  loss and overall health benefits. We discussed the following Behavioral  Modification Strategies today: increasing lean protein intake, work on meal planning and easy cooking plans, better snacking choices, and keep a strict food journal   Christina Rodriguez has agreed to follow up with our clinic in 3 weeks. She was informed of the importance of frequent follow up visits to maximize her success with intensive lifestyle modifications for her multiple health conditions.   OBESITY BEHAVIORAL INTERVENTION VISIT  Today's visit was # 9 out of 22.  Starting weight: 260 lbs Starting date: 12/26/16 Today's weight : 251 lbs  Today's date: 05/21/2017 Total lbs lost to date: 9 (Patients must lose 7 lbs in the first 6 months to continue with counseling)   ASK: We discussed the diagnosis of obesity with Christina Rodriguez today and Christina Rodriguez agreed to give Christina Rodriguez permission to discuss obesity behavioral modification therapy today.  ASSESS: Christina Rodriguez has the diagnosis of obesity and her BMI today is 3639.3 Christina Rodriguez is in the action stage of change   ADVISE: Christina Rodriguez was educated on the multiple health risks of obesity as well as the benefit of weight loss to improve her health. She was advised of the need for long term treatment and the importance of lifestyle modifications.  AGREE: Multiple dietary modification options and treatment options were discussed and  Christina Rodriguez agreed to the above obesity treatment plan.  I, Burt KnackSharon Martin, am acting as transcriptionist for Quillian Quincearen Cledith Kamiya, MD  I have reviewed the above documentation for accuracy and completeness, and I agree with the above. -Quillian Quincearen Vardaan Depascale, MD

## 2017-06-11 ENCOUNTER — Telehealth: Payer: Self-pay | Admitting: Nurse Practitioner

## 2017-06-11 MED ORDER — PANTOPRAZOLE SODIUM 40 MG PO TBEC: 40.0000 mg | DELAYED_RELEASE_TABLET | Freq: Every day | ORAL | 1 refills | Status: DC

## 2017-06-11 MED FILL — PANTOPRAZOLE SOD DR 40 MG T: 40 | 90 days supply | Qty: 90 | Fill #0

## 2017-06-11 NOTE — Telephone Encounter (Signed)
Sent rx,pt is aware.

## 2017-06-11 NOTE — Telephone Encounter (Signed)
Pt is requesting a 90 day supply of Protonix called in.    [facility]

## 2017-06-19 ENCOUNTER — Ambulatory Visit (INDEPENDENT_AMBULATORY_CARE_PROVIDER_SITE_OTHER): Payer: 59 | Admitting: Family Medicine

## 2017-06-19 VITALS — BP 114/74 | HR 74 | Temp 98.1°F | Ht 67.0 in | Wt 255.0 lb

## 2017-06-19 DIAGNOSIS — I10 Essential (primary) hypertension: Secondary | ICD-10-CM | POA: Diagnosis not present

## 2017-06-19 DIAGNOSIS — E559 Vitamin D deficiency, unspecified: Secondary | ICD-10-CM | POA: Diagnosis not present

## 2017-06-19 DIAGNOSIS — Z6841 Body Mass Index (BMI) 40.0 and over, adult: Secondary | ICD-10-CM | POA: Diagnosis not present

## 2017-06-19 NOTE — Progress Notes (Signed)
Office: [phone]  /  Fax: [phone]   HPI:   Chief Complaint: OBESITY Christina Rodriguez is here to discuss her progress with her obesity treatment plan. She is on the  keep a food journal with 1500 calories and 90 grams of  protein  and is following her eating plan approximately 90 % of the time. She states she is walking for 30 minutes 3-4 times per week. Christina Rodriguez has been getting Premier Protein as alternative if not going to meet goal. Voices no difficulty in journaling, but frustration in fluctuation of weight and hovering around 251-256 pounds.   Her weight is 255 lb (115.7 kg) today and has had a weight gain of 4 pounds over a period of 4 weeks since her last visit. She has lost 5 lbs since starting treatment with [place].  Vitamin D deficiency Christina Rodriguez has a diagnosis of vitamin D deficiency. She is currently taking vit D and denies needing a refill at this time. She denies nausea, vomiting or muscle weakness.    Ref. Range 04/23/2017 08:10  Vitamin D, 25-Hydroxy Latest Ref Range: 30.0 - 100.0 ng/mL 22.3 (L)   Hypertension Christina Rodriguez is a 42 y.o. female with hypertension.  Christina Rodriguez denies chest pain or shortness of breath on exertion. She is working weight loss to help control her blood pressure with the goal of decreasing her risk of heart attack and stroke. Autumns blood pressure is currently controlled.    ALLERGIES: Allergies  Allergen Reactions  . Beta Adrenergic Blockers Shortness Of Breath  . Shrimp [Shellfish Allergy] Hives  . Soybeans Hives    MEDICATIONS: Current Outpatient Medications on File Prior to Visit  Medication Sig Dispense Refill  . albuterol (PROVENTIL HFA;VENTOLIN HFA) 108 (90 BASE) MCG/ACT inhaler Inhale 2 puffs into the lungs every 4 (four) hours as needed for wheezing or shortness of breath. 1 Inhaler 5  . azithromycin (ZITHROMAX Z-PAK) 250 MG tablet Take 2 tablets (500 mg) on  Day 1,  followed by 1 tablet (250 mg) once daily on Days 2 through 5. 6  each 0  . cyanocobalamin 500 MCG tablet Take 500 mcg by mouth daily.    Marland Kitchen diltiazem (CARDIZEM CD) 240 MG 24 hr capsule Take 1 capsule (240 mg total) by mouth daily. 90 capsule 1  . EPINEPHrine 0.3 mg/0.3 mL IJ SOAJ injection Inject 0.3 mg into the muscle once. Reported on 05/03/2015    . fluconazole (DIFLUCAN) 150 MG tablet One po qd prn yeast infection; may repeat in 3-4 days if needed 2 tablet 0  . Fluticasone-Salmeterol (ADVAIR DISKUS) 250-50 MCG/DOSE AEPB Inhale 1 puff into the lungs 2 (two) times daily.      . hydrochlorothiazide (HYDRODIURIL) 25 MG tablet Take 1 tablet (25 mg total) by mouth daily. 90 tablet 1  . ibuprofen (ADVIL,MOTRIN) 200 MG tablet Take 400 mg by mouth every 6 (six) hours as needed for headache, mild pain or moderate pain.    . Insulin Pen Needle (PEN NEEDLES) 32G X 5 MM MISC Inject 1.8 mg into the skin daily. 100 each 0  . liraglutide (VICTOZA) 18 MG/3ML SOPN Take  1.8 mg subcu qd 9 pen 1  . metFORMIN (GLUCOPHAGE-XR) 500 MG 24 hr tablet Take 2 tablets (1,000 mg total) by mouth daily with breakfast. 180 tablet 1  . Multiple Vitamins-Minerals (MULTIVITAMIN ADULT PO) Take 1 tablet by mouth daily.    . pantoprazole (PROTONIX) 40 MG tablet Take 1 tablet (40 mg total) by mouth daily. 90 tablet  1  . rizatriptan (MAXALT-MLT) 10 MG disintegrating tablet Take 1 tablet (10 mg total) by mouth as needed for migraine. May repeat in 2 hours if needed; max 2 per 24 hours 27 tablet 1  . Vitamin D, Ergocalciferol, (DRISDOL) 50000 units CAPS capsule Take 1 capsule (50,000 Units total) by mouth every 7 (seven) days. 4 capsule 0   No current facility-administered medications on file prior to visit.     PAST MEDICAL HISTORY: Past Medical History:  Diagnosis Date  . Adenomyosis   . Anemia    HIstory of   . Asthma    albuterol inhaler prn-recent uri-using more freq  . Dysrhythmia    history of svt-controlled on cardizem-diagnosed in 1999-  . Gall bladder disease   . GERD  (gastroesophageal reflux disease)    uses protonix  . Headache    migraines   . History of polycystic ovarian disease   . Infertility associated with anovulation   . Metabolic syndrome   . Multiple food allergies    shrimp  . Osteoarthritis   . Palpitations   . PCOS (polycystic ovarian syndrome)   . PONV (postoperative nausea and vomiting)   . Prediabetes   . Recurrent upper respiratory infection (URI)    resolving  . SVT (supraventricular tachycardia) (HCC)    History of   . Vitamin D deficiency   . VSD (ventricular septal defect)    history of -no problems    PAST SURGICAL HISTORY: Past Surgical History:  Procedure Laterality Date  . CESAREAN SECTION  2010  . CESAREAN SECTION  11/16/2010   Procedure: CESAREAN SECTION;  Surgeon: Almon HerculesKendra H. Ross;  Location: [facility];  Service: Gynecology;  Laterality: N/A;  Repeat   . CHOLECYSTECTOMY  2003  . DILATION AND CURETTAGE OF UTERUS    . HYSTEROSCOPY W/D&C N/A 12/06/2014   Procedure: DILATATION AND CURETTAGE /HYSTEROSCOPY;  Surgeon: [name], MD;  Location: [facility];  Service: Gynecology;  Laterality: N/A;  . uterine polyps      SOCIAL HISTORY: Social History   Tobacco Use  . Smoking status: Never Smoker  . Smokeless tobacco: Never Used  Substance Use Topics  . Alcohol use: No  . Drug use: No    FAMILY HISTORY: Family History  Problem Relation Age of Onset  . Diabetes Father   . Hypertension Father   . Hyperlipidemia Father   . Sleep apnea Father   . Obesity Father     ROS: Review of Systems  Constitutional: Negative for weight loss.  Respiratory: Negative for shortness of breath.   Cardiovascular: Negative for chest pain.       Negative for chest pressure   Neurological: Negative for headaches.    PHYSICAL EXAM: Blood pressure 114/74, pulse 74, temperature 98.1 F (36.7 C), temperature source Oral, height 5\' 7"  (1.702 m), weight 255 lb (115.7 kg), SpO2 98 %. Body mass index is 39.94 kg/m. Physical Exam    Constitutional: She is oriented to person, place, and time. She appears well-developed and well-nourished.  HENT:  Head: Normocephalic.  Eyes: EOM are normal.  Neck: Normal range of motion.  Cardiovascular: Normal rate.  Pulmonary/Chest: Effort normal.  Musculoskeletal: Normal range of motion.  Neurological: She is alert and oriented to person, place, and time.  Skin: Skin is warm and dry.  Psychiatric: She has a normal mood and affect. Her behavior is normal.  Vitals reviewed.   RECENT LABS AND TESTS: BMET    Component Value Date/Time   NA 140  04/23/2017 0810   K 4.3 04/23/2017 0810   CL 102 04/23/2017 0810   CO2 26 04/23/2017 0810   GLUCOSE 82 04/23/2017 0810   GLUCOSE 127 (H) 11/16/2014 1625   BUN 13 04/23/2017 0810   CREATININE 0.68 04/23/2017 0810   CREATININE 0.62 01/20/2014 1317   CALCIUM 9.1 04/23/2017 0810   GFRNONAA 109 04/23/2017 0810   GFRAA 126 04/23/2017 0810   Lab Results  Component Value Date   HGBA1C 5.5 04/23/2017   HGBA1C 5.3 12/26/2016   HGBA1C 5.5 02/22/2016   Lab Results  Component Value Date   INSULIN 24.6 04/23/2017   INSULIN 18.1 12/26/2016   CBC    Component Value Date/Time   WBC 6.7 12/26/2016 0957   WBC 8.9 11/16/2014 1625   RBC 4.64 12/26/2016 0957   RBC 4.34 11/16/2014 1625   HGB 12.9 12/26/2016 0957   HCT 39.7 12/26/2016 0957   PLT 389 (H) 01/26/2015 0823   MCV 86 12/26/2016 0957   MCH 27.8 12/26/2016 0957   MCH 27.0 11/16/2014 1625   MCHC 32.5 12/26/2016 0957   MCHC 31.8 11/16/2014 1625   RDW 15.2 12/26/2016 0957   LYMPHSABS 1.7 12/26/2016 0957   EOSABS 0.1 12/26/2016 0957   BASOSABS 0.0 12/26/2016 0957   Iron/TIBC/Ferritin/ %Sat    Component Value Date/Time   FERRITIN 21 02/22/2016 0823   Lipid Panel     Component Value Date/Time   CHOL 147 04/23/2017 0810   TRIG 75 04/23/2017 0810   HDL 33 (L) 04/23/2017 0810   CHOLHDL 3.6 02/22/2016 0823   CHOLHDL 4.6 01/20/2014 1317   VLDL 22 01/20/2014 1317   LDLCALC 99  04/23/2017 0810   Hepatic Function Panel     Component Value Date/Time   PROT 6.8 04/23/2017 0810   ALBUMIN 3.9 04/23/2017 0810   AST 16 04/23/2017 0810   ALT 17 04/23/2017 0810   ALKPHOS 65 04/23/2017 0810   BILITOT 0.3 04/23/2017 0810   BILIDIR 0.09 02/22/2016 0823   IBILI 0.2 01/20/2014 1317      Component Value Date/Time   TSH 2.220 12/26/2016 0957   TSH 3.130 02/22/2016 0823   TSH 2.880 01/26/2015 0823     Ref. Range 04/23/2017 08:10  Vitamin D, 25-Hydroxy Latest Ref Range: 30.0 - 100.0 ng/mL 22.3 (L)   ASSESSMENT AND PLAN: Vitamin D deficiency  Essential hypertension  Class 3 severe obesity with serious comorbidity and body mass index (BMI) of 40.0 to 44.9 in adult, unspecified obesity type (HCC)  PLAN: Vitamin D Deficiency Christina Rodriguez was informed that low vitamin D levels contributes to fatigue and are associated with obesity, breast, and colon cancer. She agrees to continue to take prescription Vit D @50 ,000 IU every week and will follow up for routine testing of vitamin D, at least 2-3 times per year. She was informed of the risk of over-replacement of vitamin D and agrees to not increase her dose unless she discusses this with [place] first.  Hypertension We discussed sodium restriction, working on healthy weight loss, and a regular exercise program as the means to achieve improved blood pressure control. Christina Rodriguez agreed with this plan and agreed to follow up as directed. We will continue to monitor her blood pressure as well as her progress with the above lifestyle modifications. She will continue her medications as prescribed and will watch for signs of hypotension as she continues her lifestyle modifications.  Obesity Christina Rodriguez is currently in the action stage of change. As such, her goal is to continue  with weight loss efforts She has agreed to keep a food journal with 1500 calories and 90 grams of  protein  Christina Rodriguez has been instructed to work up to a goal of 150 minutes of  combined cardio and strengthening exercise per week or resistance training 5-10 minutes 3 times a week for weight loss and overall health benefits. We discussed the following Behavioral Modification Strategies today: increasing lean protein intake and decreasing simple carbohydrates and keeping a strict food journal.    Christina Rodriguez has agreed to follow up with our clinic in 3 weeks. She was informed of the importance of frequent follow up visits to maximize her success with intensive lifestyle modifications for her multiple health conditions.  We spent > than 50% of the 15 minute visit on the counseling as documented in the note.   OBESITY BEHAVIORAL INTERVENTION VISIT  Today's visit was # 10 out of 22.  Starting weight: 260 lb Starting date: 12/26/16 Today's weight : 255 lb Today's date: 06/19/2017 Total lbs lost to date: 5 (Patients must lose 7 lbs in the first 6 months to continue with counseling)   ASK: We discussed the diagnosis of obesity with Christina Rodriguez today and Christina Rodriguez agreed to give [place] permission to discuss obesity behavioral modification therapy today.  ASSESS: Christina Rodriguez has the diagnosis of obesity and her BMI today is 39.94 Christina Rodriguez is in the action stage of change   ADVISE: Christina Rodriguez was educated on the multiple health risks of obesity as well as the benefit of weight loss to improve her health. She was advised of the need for long term treatment and the importance of lifestyle modifications.  AGREE: Multiple dietary modification options and treatment options were discussed and  Christina Rodriguez agreed to the above obesity treatment plan.  I, Jeralene Peters, am acting as Energy manager for [employer]  I have reviewed the above documentation for accuracy and completeness, and I agree with the above. -Quillian Quince, MD

## 2017-07-02 ENCOUNTER — Other Ambulatory Visit: Payer: Self-pay | Admitting: Nurse Practitioner

## 2017-07-02 DIAGNOSIS — R102 Pelvic and perineal pain: Secondary | ICD-10-CM | POA: Diagnosis not present

## 2017-07-02 MED ORDER — HYDROCHLOROTHIAZIDE 25 MG PO TABS: 25.0000 mg | ORAL_TABLET | Freq: Every day | ORAL | 1 refills | Status: DC

## 2017-07-02 MED FILL — HYDROCHLOROTHIAZIDE 25 MG T: 25 | 90 days supply | Qty: 90 | Fill #0

## 2017-07-16 ENCOUNTER — Ambulatory Visit (INDEPENDENT_AMBULATORY_CARE_PROVIDER_SITE_OTHER): Payer: 59 | Admitting: Family Medicine

## 2017-07-16 VITALS — BP 118/79 | HR 88 | Temp 97.9°F | Ht 67.0 in | Wt 255.0 lb

## 2017-07-16 DIAGNOSIS — Z6841 Body Mass Index (BMI) 40.0 and over, adult: Secondary | ICD-10-CM

## 2017-07-16 DIAGNOSIS — E282 Polycystic ovarian syndrome: Secondary | ICD-10-CM | POA: Diagnosis not present

## 2017-07-16 DIAGNOSIS — E559 Vitamin D deficiency, unspecified: Secondary | ICD-10-CM | POA: Diagnosis not present

## 2017-07-17 ENCOUNTER — Ambulatory Visit (INDEPENDENT_AMBULATORY_CARE_PROVIDER_SITE_OTHER): Payer: 59 | Admitting: Family Medicine

## 2017-07-17 ENCOUNTER — Encounter (INDEPENDENT_AMBULATORY_CARE_PROVIDER_SITE_OTHER): Payer: Self-pay

## 2017-07-18 ENCOUNTER — Other Ambulatory Visit: Payer: Self-pay | Admitting: Nurse Practitioner

## 2017-07-18 ENCOUNTER — Telehealth: Payer: Self-pay | Admitting: *Deleted

## 2017-07-18 MED ORDER — LIRAGLUTIDE 18 MG/3ML ~~LOC~~ SOPN: PEN_INJECTOR | SUBCUTANEOUS | 1 refills | Status: DC

## 2017-07-18 MED ORDER — PEN NEEDLES 32G X 5 MM MISC: 1.8000 mg | Freq: Every day | 0 refills | Status: DC

## 2017-07-18 MED FILL — UNIFINE PENTIPS 31GX3/16": 31G X 5 MM | 90 days supply | Qty: 100 | Fill #0

## 2017-07-18 MED FILL — VICTOZA 18 MG/3 ML INJECT P: 18 | 90 days supply | Qty: 27 | Fill #0

## 2017-07-18 MED FILL — METFORMIN HCL ER 500 MG TAB: 500 | 90 days supply | Qty: 180 | Fill #0

## 2017-07-18 MED FILL — UNIFINE PENTIPS 31GX3/16: 31G X 5 MM | 90 days supply | Qty: 100 | Fill #0

## 2017-07-18 NOTE — Telephone Encounter (Signed)
Done

## 2017-07-18 NOTE — Telephone Encounter (Signed)
Pt would like a refill on victoza 90 day supply to [facility].

## 2017-07-18 NOTE — Telephone Encounter (Signed)
See message below and also pt needs pen needles refilled

## 2017-07-22 NOTE — Progress Notes (Signed)
Office: [phone]  /  Fax: [phone]   HPI:   Chief Complaint: OBESITY Christina Rodriguez is here to discuss her progress with her obesity treatment plan. She is on the keep a food journal with 1500 calories and 90 grams of protein daily and is following her eating plan approximately 95 % of the time. She states she is walking and stretching for 30 minutes 4 times per week. Christina Rodriguez is doing well hitting protein and within calories, she is liking journaling. Her weight is 255 lb (115.7 kg) today and has not lost weight since her last visit. She has lost 5 lbs since starting treatment with [place].  Vitamin D Deficiency Christina Rodriguez has a diagnosis of vitamin D deficiency. She is currently taking prescription Vit D and denies nausea, vomiting or muscle weakness.  Polycystic Ovary Syndrome Christina Rodriguez has a history of polycystic ovary syndrome. Her medications are prescribed by her primary care physician.  ALLERGIES: Allergies  Allergen Reactions  . Beta Adrenergic Blockers Shortness Of Breath  . Shrimp [Shellfish Allergy] Hives  . Soybeans Hives    MEDICATIONS: Current Outpatient Medications on File Prior to Visit  Medication Sig Dispense Refill  . albuterol (PROVENTIL HFA;VENTOLIN HFA) 108 (90 BASE) MCG/ACT inhaler Inhale 2 puffs into the lungs every 4 (four) hours as needed for wheezing or shortness of breath. 1 Inhaler 5  . azithromycin (ZITHROMAX Z-PAK) 250 MG tablet Take 2 tablets (500 mg) on  Day 1,  followed by 1 tablet (250 mg) once daily on Days 2 through 5. 6 each 0  . cyanocobalamin 500 MCG tablet Take 500 mcg by mouth daily.    Marland Kitchen diltiazem (CARDIZEM CD) 240 MG 24 hr capsule Take 1 capsule (240 mg total) by mouth daily. 90 capsule 1  . EPINEPHrine 0.3 mg/0.3 mL IJ SOAJ injection Inject 0.3 mg into the muscle once. Reported on 05/03/2015    . fluconazole (DIFLUCAN) 150 MG tablet One po qd prn yeast infection; may repeat in 3-4 days if needed 2 tablet 0  . Fluticasone-Salmeterol (ADVAIR DISKUS)  250-50 MCG/DOSE AEPB Inhale 1 puff into the lungs 2 (two) times daily.      . hydrochlorothiazide (HYDRODIURIL) 25 MG tablet Take 1 tablet (25 mg total) by mouth daily. 90 tablet 1  . ibuprofen (ADVIL,MOTRIN) 200 MG tablet Take 400 mg by mouth every 6 (six) hours as needed for headache, mild pain or moderate pain.    . Multiple Vitamins-Minerals (MULTIVITAMIN ADULT PO) Take 1 tablet by mouth daily.    . pantoprazole (PROTONIX) 40 MG tablet Take 1 tablet (40 mg total) by mouth daily. 90 tablet 1  . rizatriptan (MAXALT-MLT) 10 MG disintegrating tablet Take 1 tablet (10 mg total) by mouth as needed for migraine. May repeat in 2 hours if needed; max 2 per 24 hours 27 tablet 1  . Vitamin D, Ergocalciferol, (DRISDOL) 50000 units CAPS capsule Take 1 capsule (50,000 Units total) by mouth every 7 (seven) days. 4 capsule 0   No current facility-administered medications on file prior to visit.     PAST MEDICAL HISTORY: Past Medical History:  Diagnosis Date  . Adenomyosis   . Anemia    HIstory of   . Asthma    albuterol inhaler prn-recent uri-using more freq  . Dysrhythmia    history of svt-controlled on cardizem-diagnosed in 1999-  . Gall bladder disease   . GERD (gastroesophageal reflux disease)    uses protonix  . Headache    migraines   . History of polycystic  ovarian disease   . Infertility associated with anovulation   . Metabolic syndrome   . Multiple food allergies    shrimp  . Osteoarthritis   . Palpitations   . PCOS (polycystic ovarian syndrome)   . PONV (postoperative nausea and vomiting)   . Prediabetes   . Recurrent upper respiratory infection (URI)    resolving  . SVT (supraventricular tachycardia) (HCC)    History of   . Vitamin D deficiency   . VSD (ventricular septal defect)    history of -no problems    PAST SURGICAL HISTORY: Past Surgical History:  Procedure Laterality Date  . CESAREAN SECTION  2010  . CESAREAN SECTION  11/16/2010   Procedure: CESAREAN SECTION;   Surgeon: Almon Hercules;  Location: [facility];  Service: Gynecology;  Laterality: N/A;  Repeat   . CHOLECYSTECTOMY  2003  . DILATION AND CURETTAGE OF UTERUS    . HYSTEROSCOPY W/D&C N/A 12/06/2014   Procedure: DILATATION AND CURETTAGE /HYSTEROSCOPY;  Surgeon: Waynard Reeds, MD;  Location: [facility];  Service: Gynecology;  Laterality: N/A;  . uterine polyps      SOCIAL HISTORY: Social History   Tobacco Use  . Smoking status: Never Smoker  . Smokeless tobacco: Never Used  Substance Use Topics  . Alcohol use: No  . Drug use: No    FAMILY HISTORY: Family History  Problem Relation Age of Onset  . Diabetes Father   . Hypertension Father   . Hyperlipidemia Father   . Sleep apnea Father   . Obesity Father     ROS: Review of Systems  Constitutional: Negative for weight loss.  Gastrointestinal: Negative for nausea and vomiting.  Musculoskeletal:       Negative muscle weakness    PHYSICAL EXAM: Blood pressure 118/79, pulse 88, temperature 97.9 F (36.6 C), temperature source Oral, height 5\' 7"  (1.702 m), weight 255 lb (115.7 kg), SpO2 99 %. Body mass index is 39.94 kg/m. Physical Exam  Constitutional: She is oriented to person, place, and time. She appears well-developed and well-nourished.  Cardiovascular: Normal rate.  Pulmonary/Chest: Effort normal.  Musculoskeletal: Normal range of motion.  Neurological: She is oriented to person, place, and time.  Skin: Skin is warm and dry.  Psychiatric: She has a normal mood and affect. Her behavior is normal.  Vitals reviewed.   RECENT LABS AND TESTS: BMET    Component Value Date/Time   NA 140 04/23/2017 0810   K 4.3 04/23/2017 0810   CL 102 04/23/2017 0810   CO2 26 04/23/2017 0810   GLUCOSE 82 04/23/2017 0810   GLUCOSE 127 (H) 11/16/2014 1625   BUN 13 04/23/2017 0810   CREATININE 0.68 04/23/2017 0810   CREATININE 0.62 01/20/2014 1317   CALCIUM 9.1 04/23/2017 0810   GFRNONAA 109 04/23/2017 0810   GFRAA 126 04/23/2017 0810    Lab Results  Component Value Date   HGBA1C 5.5 04/23/2017   HGBA1C 5.3 12/26/2016   HGBA1C 5.5 02/22/2016   Lab Results  Component Value Date   INSULIN 24.6 04/23/2017   INSULIN 18.1 12/26/2016   CBC    Component Value Date/Time   WBC 6.7 12/26/2016 0957   WBC 8.9 11/16/2014 1625   RBC 4.64 12/26/2016 0957   RBC 4.34 11/16/2014 1625   HGB 12.9 12/26/2016 0957   HCT 39.7 12/26/2016 0957   PLT 389 (H) 01/26/2015 0823   MCV 86 12/26/2016 0957   MCH 27.8 12/26/2016 0957   MCH 27.0 11/16/2014 1625   MCHC 32.5  12/26/2016 0957   MCHC 31.8 11/16/2014 1625   RDW 15.2 12/26/2016 0957   LYMPHSABS 1.7 12/26/2016 0957   EOSABS 0.1 12/26/2016 0957   BASOSABS 0.0 12/26/2016 0957   Iron/TIBC/Ferritin/ %Sat    Component Value Date/Time   FERRITIN 21 02/22/2016 0823   Lipid Panel     Component Value Date/Time   CHOL 147 04/23/2017 0810   TRIG 75 04/23/2017 0810   HDL 33 (L) 04/23/2017 0810   CHOLHDL 3.6 02/22/2016 0823   CHOLHDL 4.6 01/20/2014 1317   VLDL 22 01/20/2014 1317   LDLCALC 99 04/23/2017 0810   Hepatic Function Panel     Component Value Date/Time   PROT 6.8 04/23/2017 0810   ALBUMIN 3.9 04/23/2017 0810   AST 16 04/23/2017 0810   ALT 17 04/23/2017 0810   ALKPHOS 65 04/23/2017 0810   BILITOT 0.3 04/23/2017 0810   BILIDIR 0.09 02/22/2016 0823   IBILI 0.2 01/20/2014 1317      Component Value Date/Time   TSH 2.220 12/26/2016 0957   TSH 3.130 02/22/2016 0823   TSH 2.880 01/26/2015 0823  Results for TAMMELA, BALES (MRN [id number]) as of 07/22/2017 09:26  Ref. Range 04/23/2017 08:10  Vitamin D, 25-Hydroxy Latest Ref Range: 30.0 - 100.0 ng/mL 22.3 (L)    ASSESSMENT AND PLAN: Vitamin D deficiency  PCOS (polycystic ovarian syndrome)  Class 3 severe obesity with serious comorbidity and body mass index (BMI) of 40.0 to 44.9 in adult, unspecified obesity type (HCC)  PLAN:  Vitamin D Deficiency Christina Rodriguez was informed that low vitamin D levels contributes to  fatigue and are associated with obesity, breast, and colon cancer. Christina Rodriguez agrees to continue taking prescription Vit D @50 ,000 IU every week #4 and will follow up for routine testing of vitamin D, at least 2-3 times per year. She was informed of the risk of over-replacement of vitamin D and agrees to not increase her dose unless she discusses this with [place] first. Christina Rodriguez agrees to follow up with our clinic in 2 weeks.  Polycystic Ovary Syndrome Christina Rodriguez agrees to continue Victoza and metformin (she states she got 3 month refills 2 months ago). Christina Rodriguez agrees to follow up with our clinic in 2 weeks.  We spent > than 50% of the 15 minute visit on the counseling as documented in the note.  Obesity Christina Rodriguez is currently in the action stage of change. As such, her goal is to continue with weight loss efforts She has agreed to keep a food journal with 1500 calories and 90 grams of protein daily Christina Rodriguez has been instructed to work up to a goal of 150 minutes of combined cardio and strengthening exercise per week or adding 10 minute resistance training 4 times for 2 weeks for weight loss and overall health benefits. We discussed the following Behavioral Modification Strategies today: increasing lean protein intake, work on meal planning and easy cooking plans, planning for success, and keeping a strict food journal   Christina Rodriguez has agreed to follow up with our clinic in 2 weeks. She was informed of the importance of frequent follow up visits to maximize her success with intensive lifestyle modifications for her multiple health conditions.   OBESITY BEHAVIORAL INTERVENTION VISIT  Today's visit was # 11 out of 22.  Starting weight: 260 lbs Starting date: 12/26/16 Today's weight : 255 lbs  Today's date: 07/16/2017 Total lbs lost to date: 5 (Patients must lose 7 lbs in the first 6 months to continue with counseling)   ASK: We discussed  the diagnosis of obesity with Christina Rodriguez today and Christina Rodriguez agreed to give  us permission to discuss obesity behavioral modification therapy today.  ASSESS: Christina Rodriguez has the diagnosis of obesity and her BMI today is 39.93 Christina Rodriguez is in the action stage of change   ADVISE: Christina Rodriguez was educated on the multiple health risks of obesity as well as the benefit of weight loss to improve her health. She was advised of the need for long term treatment and the importance of lifestyle modifications.  AGREE: Multiple dietary modification options and treatment options were discussed and  Christina Rodriguez agreed to the above obesity treatment plan.  I, Burt KnackSharon Martin, am acting as transcriptionist for Debbra RidingAlexandria Kadolph, MD  I have reviewed the above documentation for accuracy and completeness, and I agree with the above. - Debbra RidingAlexandria Kadolph, MD

## 2017-08-05 ENCOUNTER — Ambulatory Visit (INDEPENDENT_AMBULATORY_CARE_PROVIDER_SITE_OTHER): Payer: 59 | Admitting: Family Medicine

## 2017-08-19 ENCOUNTER — Ambulatory Visit (INDEPENDENT_AMBULATORY_CARE_PROVIDER_SITE_OTHER): Payer: 59 | Admitting: Family Medicine

## 2017-08-19 VITALS — BP 107/73 | HR 76 | Temp 98.1°F | Ht 67.0 in | Wt 252.0 lb

## 2017-08-19 DIAGNOSIS — F4329 Adjustment disorder with other symptoms: Secondary | ICD-10-CM

## 2017-08-19 DIAGNOSIS — Z6839 Body mass index (BMI) 39.0-39.9, adult: Secondary | ICD-10-CM | POA: Diagnosis not present

## 2017-08-19 DIAGNOSIS — Z634 Disappearance and death of family member: Secondary | ICD-10-CM

## 2017-08-19 DIAGNOSIS — F4321 Adjustment disorder with depressed mood: Secondary | ICD-10-CM

## 2017-08-19 NOTE — Progress Notes (Signed)
Office: [phone]  /  Fax: [phone]   HPI:   Chief Complaint: OBESITY Christina Rodriguez is here to discuss her progress with her obesity treatment plan. She is on the keep a food journal with 1500 calories and 90 grams of protein daily and is following her eating plan approximately 80 % of the time. She states she is walking more for exercise. Christina Rodriguez continues to do well with weight loss, even with extra stress with her grandmother dying and her father-in-law sick and hospitalized. She did very well with controlling her portions and making smarter food choices and was still mindful, even when she couldn't eat as well as she would have liked. Her weight is 252 lb (114.3 kg) today and has had a weight loss of 3 pounds over a period of 4 weeks since her last visit. She has lost 8 lbs since starting treatment with us.  Grieving Christina Rodriguez is struggling with the death of her grandmother, who she was very close with. Her sleeping is not as good and she is tearful in the office today. She shows no sign of suicidal or homicidal ideations.  ALLERGIES: Allergies  Allergen Reactions  . Beta Adrenergic Blockers Shortness Of Breath  . Shrimp [Shellfish Allergy] Hives  . Soybeans Hives    MEDICATIONS: Current Outpatient Medications on File Prior to Visit  Medication Sig Dispense Refill  . albuterol (PROVENTIL HFA;VENTOLIN HFA) 108 (90 BASE) MCG/ACT inhaler Inhale 2 puffs into the lungs every 4 (four) hours as needed for wheezing or shortness of breath. 1 Inhaler 5  . azithromycin (ZITHROMAX Z-PAK) 250 MG tablet Take 2 tablets (500 mg) on  Day 1,  followed by 1 tablet (250 mg) once daily on Days 2 through 5. 6 each 0  . cyanocobalamin 500 MCG tablet Take 500 mcg by mouth daily.    Marland Kitchen. diltiazem (CARDIZEM CD) 240 MG 24 hr capsule Take 1 capsule (240 mg total) by mouth daily. 90 capsule 1  . EPINEPHrine 0.3 mg/0.3 mL IJ SOAJ injection Inject 0.3 mg into the muscle once. Reported on 05/03/2015    . fluconazole  (DIFLUCAN) 150 MG tablet One po qd prn yeast infection; may repeat in 3-4 days if needed 2 tablet 0  . Fluticasone-Salmeterol (ADVAIR DISKUS) 250-50 MCG/DOSE AEPB Inhale 1 puff into the lungs 2 (two) times daily.      . hydrochlorothiazide (HYDRODIURIL) 25 MG tablet Take 1 tablet (25 mg total) by mouth daily. 90 tablet 1  . ibuprofen (ADVIL,MOTRIN) 200 MG tablet Take 400 mg by mouth every 6 (six) hours as needed for headache, mild pain or moderate pain.    . Insulin Pen Needle (PEN NEEDLES) 32G X 5 MM MISC Inject 1.8 mg into the skin daily. 100 each 0  . liraglutide (VICTOZA) 18 MG/3ML SOPN Take  1.8 mg subcu qd 9 pen 1  . metFORMIN (GLUCOPHAGE-XR) 500 MG 24 hr tablet TAKE 2 TABLETS (1,000 MG TOTAL) BY MOUTH DAILY WITH BREAKFAST. 180 tablet 1  . Multiple Vitamins-Minerals (MULTIVITAMIN ADULT PO) Take 1 tablet by mouth daily.    . pantoprazole (PROTONIX) 40 MG tablet Take 1 tablet (40 mg total) by mouth daily. 90 tablet 1  . rizatriptan (MAXALT-MLT) 10 MG disintegrating tablet Take 1 tablet (10 mg total) by mouth as needed for migraine. May repeat in 2 hours if needed; max 2 per 24 hours 27 tablet 1  . Vitamin D, Ergocalciferol, (DRISDOL) 50000 units CAPS capsule Take 1 capsule (50,000 Units total) by mouth every 7 (seven)  days. 4 capsule 0   No current facility-administered medications on file prior to visit.     PAST MEDICAL HISTORY: Past Medical History:  Diagnosis Date  . Adenomyosis   . Anemia    HIstory of   . Asthma    albuterol inhaler prn-recent uri-using more freq  . Dysrhythmia    history of svt-controlled on cardizem-diagnosed in 1999-  . Gall bladder disease   . GERD (gastroesophageal reflux disease)    uses protonix  . Headache    migraines   . History of polycystic ovarian disease   . Infertility associated with anovulation   . Metabolic syndrome   . Multiple food allergies    shrimp  . Osteoarthritis   . Palpitations   . PCOS (polycystic ovarian syndrome)   . PONV  (postoperative nausea and vomiting)   . Prediabetes   . Recurrent upper respiratory infection (URI)    resolving  . SVT (supraventricular tachycardia) (HCC)    History of   . Vitamin D deficiency   . VSD (ventricular septal defect)    history of -no problems    PAST SURGICAL HISTORY: Past Surgical History:  Procedure Laterality Date  . CESAREAN SECTION  2010  . CESAREAN SECTION  11/16/2010   Procedure: CESAREAN SECTION;  Surgeon: Almon Hercules;  Location: [facility];  Service: Gynecology;  Laterality: N/A;  Repeat   . CHOLECYSTECTOMY  2003  . DILATION AND CURETTAGE OF UTERUS    . HYSTEROSCOPY W/D&C N/A 12/06/2014   Procedure: DILATATION AND CURETTAGE /HYSTEROSCOPY;  Surgeon: Waynard Reeds, MD;  Location: [facility];  Service: Gynecology;  Laterality: N/A;  . uterine polyps      SOCIAL HISTORY: Social History   Tobacco Use  . Smoking status: Never Smoker  . Smokeless tobacco: Never Used  Substance Use Topics  . Alcohol use: No  . Drug use: No    FAMILY HISTORY: Family History  Problem Relation Age of Onset  . Diabetes Father   . Hypertension Father   . Hyperlipidemia Father   . Sleep apnea Father   . Obesity Father     ROS: Review of Systems  Constitutional: Positive for weight loss.  Psychiatric/Behavioral: Negative for suicidal ideas.    PHYSICAL EXAM: Blood pressure 107/73, pulse 76, temperature 98.1 F (36.7 C), temperature source Oral, height 5\' 7"  (1.702 m), weight 252 lb (114.3 kg), SpO2 100 %. Body mass index is 39.47 kg/m. Physical Exam  Constitutional: She is oriented to person, place, and time. She appears well-developed and well-nourished.  Cardiovascular: Normal rate.  Pulmonary/Chest: Effort normal.  Musculoskeletal: Normal range of motion.  Neurological: She is oriented to person, place, and time.  Skin: Skin is warm and dry.  Vitals reviewed.   RECENT LABS AND TESTS: BMET    Component Value Date/Time   NA 140 04/23/2017 0810   K 4.3 04/23/2017  0810   CL 102 04/23/2017 0810   CO2 26 04/23/2017 0810   GLUCOSE 82 04/23/2017 0810   GLUCOSE 127 (H) 11/16/2014 1625   BUN 13 04/23/2017 0810   CREATININE 0.68 04/23/2017 0810   CREATININE 0.62 01/20/2014 1317   CALCIUM 9.1 04/23/2017 0810   GFRNONAA 109 04/23/2017 0810   GFRAA 126 04/23/2017 0810   Lab Results  Component Value Date   HGBA1C 5.5 04/23/2017   HGBA1C 5.3 12/26/2016   HGBA1C 5.5 02/22/2016   Lab Results  Component Value Date   INSULIN 24.6 04/23/2017   INSULIN 18.1 12/26/2016   CBC  Component Value Date/Time   WBC 6.7 12/26/2016 0957   WBC 8.9 11/16/2014 1625   RBC 4.64 12/26/2016 0957   RBC 4.34 11/16/2014 1625   HGB 12.9 12/26/2016 0957   HCT 39.7 12/26/2016 0957   PLT 389 (H) 01/26/2015 0823   MCV 86 12/26/2016 0957   MCH 27.8 12/26/2016 0957   MCH 27.0 11/16/2014 1625   MCHC 32.5 12/26/2016 0957   MCHC 31.8 11/16/2014 1625   RDW 15.2 12/26/2016 0957   LYMPHSABS 1.7 12/26/2016 0957   EOSABS 0.1 12/26/2016 0957   BASOSABS 0.0 12/26/2016 0957   Iron/TIBC/Ferritin/ %Sat    Component Value Date/Time   FERRITIN 21 02/22/2016 0823   Lipid Panel     Component Value Date/Time   CHOL 147 04/23/2017 0810   TRIG 75 04/23/2017 0810   HDL 33 (L) 04/23/2017 0810   CHOLHDL 3.6 02/22/2016 0823   CHOLHDL 4.6 01/20/2014 1317   VLDL 22 01/20/2014 1317   LDLCALC 99 04/23/2017 0810   Hepatic Function Panel     Component Value Date/Time   PROT 6.8 04/23/2017 0810   ALBUMIN 3.9 04/23/2017 0810   AST 16 04/23/2017 0810   ALT 17 04/23/2017 0810   ALKPHOS 65 04/23/2017 0810   BILITOT 0.3 04/23/2017 0810   BILIDIR 0.09 02/22/2016 0823   IBILI 0.2 01/20/2014 1317      Component Value Date/Time   TSH 2.220 12/26/2016 0957   TSH 3.130 02/22/2016 0823   TSH 2.880 01/26/2015 0823   Results for ANADIA, HELMES (MRN [id number]) as of 08/19/2017 16:16  Ref. Range 04/23/2017 08:10  Vitamin D, 25-Hydroxy Latest Ref Range: 30.0 - 100.0 ng/mL 22.3 (L)    ASSESSMENT AND PLAN: Grieving  Class 2 severe obesity with serious comorbidity and body mass index (BMI) of 39.0 to 39.9 in adult, unspecified obesity type [facility])  PLAN:  Grieving Henreitta was reassured her grieving is normal and expected. She was advised that she can always discuss this with  [place]. She agreed to do so if needed.  We spent > than 50% of the 15 minute visit on the counseling as documented in the note.  Obesity Nolyn is currently in the action stage of change. As such, her goal is to continue with weight loss efforts She has agreed to keep a food journal with 1500 calories and 90 grams of protein daily Summer has been instructed to work up to a goal of 150 minutes of combined cardio and strengthening exercise per week for weight loss and overall health benefits. We discussed the following Behavioral Modification Strategies today: increasing lean protein intake, travel eating strategies and emotional eating strategies  Tamee has agreed to follow up with our clinic in 2 to 3 weeks fasting. She was informed of the importance of frequent follow up visits to maximize her success with intensive lifestyle modifications for her multiple health conditions.   OBESITY BEHAVIORAL INTERVENTION VISIT  Today's visit was # 12 out of 22.  Starting weight: 260 lbs Starting date: 12/26/16 Today's weight : 252 lbs  Today's date: 08/19/2017 Total lbs lost to date: 8 (Patients must lose 7 lbs in the first 6 months to continue with counseling)   ASK: We discussed the diagnosis of obesity with Marcey S Schlafer today and Delicia agreed to give [place] permission to discuss obesity behavioral modification therapy today.  ASSESS: Gage has the diagnosis of obesity and her BMI today is 39.46 Lindzy is in the action stage of change   ADVISE: Alaura  was educated on the multiple health risks of obesity as well as the benefit of weight loss to improve her health. She was advised of the need for long  term treatment and the importance of lifestyle modifications.  AGREE: Multiple dietary modification options and treatment options were discussed and  Leisl agreed to the above obesity treatment plan.  I, Nevada Crane, am acting as transcriptionist for Quillian Quince, MD  I have reviewed the above documentation for accuracy and completeness, and I agree with the above. -Quillian Quince, MD

## 2017-09-10 ENCOUNTER — Ambulatory Visit (INDEPENDENT_AMBULATORY_CARE_PROVIDER_SITE_OTHER): Payer: 59 | Admitting: Family Medicine

## 2017-09-10 VITALS — BP 101/71 | HR 78 | Temp 98.1°F | Ht 67.0 in | Wt 253.0 lb

## 2017-09-10 DIAGNOSIS — E559 Vitamin D deficiency, unspecified: Secondary | ICD-10-CM

## 2017-09-10 DIAGNOSIS — E282 Polycystic ovarian syndrome: Secondary | ICD-10-CM

## 2017-09-10 DIAGNOSIS — Z9189 Other specified personal risk factors, not elsewhere classified: Secondary | ICD-10-CM

## 2017-09-10 DIAGNOSIS — Z6839 Body mass index (BMI) 39.0-39.9, adult: Secondary | ICD-10-CM | POA: Diagnosis not present

## 2017-09-10 NOTE — Progress Notes (Signed)
Office: [phone]  /  Fax: [phone]   HPI:   Chief Complaint: OBESITY Christina Rodriguez is here to discuss her progress with her obesity treatment plan. She is on the keep a food journal with 1500 calories and 90 grams of protein daily and is following her eating plan approximately 90 % of the time. She states she is walking for 30 minutes 3 times per week. Christina Rodriguez is retaining some fluid today, otherwise is doing well with weight loss. She is journaling most days and working on increasing lean protein and vegetables. She is going on vacation soon and has questions about how to eat to not gain weight.  Her weight is 253 lb (114.8 kg) today and has gained 1 pound since her last visit. She has lost 7 lbs since starting treatment with [place].  Polycystic Ovary Syndrome Christina Rodriguez has polycystic ovary syndrome. She is on metformin and Victoza. She notes polyphagia improved and she denies nausea, vomiting, or hypoglycemia. She is working on diet, exercise, and weight loss.  At risk for diabetes Christina Rodriguez is at higher than average risk for developing diabetes due to her obesity and polycystic ovary syndrome. She currently denies polyuria or polydipsia.  Vitamin D Deficiency Christina Rodriguez has a diagnosis of vitamin D deficiency. She is on Vit D prescription, not yet at goal. She denies nausea, vomiting or muscle weakness.  ALLERGIES: Allergies  Allergen Reactions  . Beta Adrenergic Blockers Shortness Of Breath  . Shrimp [Shellfish Allergy] Hives  . Soybeans Hives    MEDICATIONS: Current Outpatient Medications on File Prior to Visit  Medication Sig Dispense Refill  . albuterol (PROVENTIL HFA;VENTOLIN HFA) 108 (90 BASE) MCG/ACT inhaler Inhale 2 puffs into the lungs every 4 (four) hours as needed for wheezing or shortness of breath. 1 Inhaler 5  . cyanocobalamin 500 MCG tablet Take 500 mcg by mouth daily.    Marland Kitchen diltiazem (CARDIZEM CD) 240 MG 24 hr capsule Take 1 capsule (240 mg total) by mouth daily. 90 capsule 1    . EPINEPHrine 0.3 mg/0.3 mL IJ SOAJ injection Inject 0.3 mg into the muscle once. Reported on 05/03/2015    . fluconazole (DIFLUCAN) 150 MG tablet One po qd prn yeast infection; may repeat in 3-4 days if needed 2 tablet 0  . Fluticasone-Salmeterol (ADVAIR DISKUS) 250-50 MCG/DOSE AEPB Inhale 1 puff into the lungs 2 (two) times daily.      . hydrochlorothiazide (HYDRODIURIL) 25 MG tablet Take 1 tablet (25 mg total) by mouth daily. 90 tablet 1  . ibuprofen (ADVIL,MOTRIN) 200 MG tablet Take 400 mg by mouth every 6 (six) hours as needed for headache, mild pain or moderate pain.    . Insulin Pen Needle (PEN NEEDLES) 32G X 5 MM MISC Inject 1.8 mg into the skin daily. 100 each 0  . liraglutide (VICTOZA) 18 MG/3ML SOPN Take  1.8 mg subcu qd 9 pen 1  . metFORMIN (GLUCOPHAGE-XR) 500 MG 24 hr tablet TAKE 2 TABLETS (1,000 MG TOTAL) BY MOUTH DAILY WITH BREAKFAST. 180 tablet 1  . Multiple Vitamins-Minerals (MULTIVITAMIN ADULT PO) Take 1 tablet by mouth daily.    . pantoprazole (PROTONIX) 40 MG tablet Take 1 tablet (40 mg total) by mouth daily. 90 tablet 1  . rizatriptan (MAXALT-MLT) 10 MG disintegrating tablet Take 1 tablet (10 mg total) by mouth as needed for migraine. May repeat in 2 hours if needed; max 2 per 24 hours 27 tablet 1  . Vitamin D, Ergocalciferol, (DRISDOL) 50000 units CAPS capsule Take 1 capsule (50,000  Units total) by mouth every 7 (seven) days. 4 capsule 0   No current facility-administered medications on file prior to visit.     PAST MEDICAL HISTORY: Past Medical History:  Diagnosis Date  . Adenomyosis   . Anemia    HIstory of   . Asthma    albuterol inhaler prn-recent uri-using more freq  . Dysrhythmia    history of svt-controlled on cardizem-diagnosed in 1999-  . Gall bladder disease   . GERD (gastroesophageal reflux disease)    uses protonix  . Headache    migraines   . History of polycystic ovarian disease   . Infertility associated with anovulation   . Metabolic syndrome    . Multiple food allergies    shrimp  . Osteoarthritis   . Palpitations   . PCOS (polycystic ovarian syndrome)   . PONV (postoperative nausea and vomiting)   . Prediabetes   . Recurrent upper respiratory infection (URI)    resolving  . SVT (supraventricular tachycardia) (HCC)    History of   . Vitamin D deficiency   . VSD (ventricular septal defect)    history of -no problems    PAST SURGICAL HISTORY: Past Surgical History:  Procedure Laterality Date  . CESAREAN SECTION  2010  . CESAREAN SECTION  11/16/2010   Procedure: CESAREAN SECTION;  Surgeon: Almon Hercules;  Location: [facility];  Service: Gynecology;  Laterality: N/A;  Repeat   . CHOLECYSTECTOMY  2003  . DILATION AND CURETTAGE OF UTERUS    . HYSTEROSCOPY W/D&C N/A 12/06/2014   Procedure: DILATATION AND CURETTAGE /HYSTEROSCOPY;  Surgeon: Waynard Reeds, MD;  Location: [facility];  Service: Gynecology;  Laterality: N/A;  . uterine polyps      SOCIAL HISTORY: Social History   Tobacco Use  . Smoking status: Never Smoker  . Smokeless tobacco: Never Used  Substance Use Topics  . Alcohol use: No  . Drug use: No    FAMILY HISTORY: Family History  Problem Relation Age of Onset  . Diabetes Father   . Hypertension Father   . Hyperlipidemia Father   . Sleep apnea Father   . Obesity Father     ROS: Review of Systems  Constitutional: Negative for weight loss.  Gastrointestinal: Negative for nausea and vomiting.  Genitourinary: Negative for frequency.  Musculoskeletal:       Negative muscle weakness  Endo/Heme/Allergies: Negative for polydipsia.       Positive polyphagia Negative hypoglycemia    PHYSICAL EXAM: Blood pressure 101/71, pulse 78, temperature 98.1 F (36.7 C), temperature source Oral, height  (1.702 m), weight 253 lb (114.8 kg), SpO2 97 %. Body mass index is 39.63 kg/m. Physical Exam  Constitutional: She is oriented to person, place, and time. She appears well-developed and well-nourished.   Cardiovascular: Normal rate.  Pulmonary/Chest: Effort normal.  Musculoskeletal: Normal range of motion.  Neurological: She is oriented to person, place, and time.  Skin: Skin is warm and dry.  Psychiatric: She has a normal mood and affect. Her behavior is normal.  Vitals reviewed.   RECENT LABS AND TESTS: BMET    Component Value Date/Time   NA 140 04/23/2017 0810   K 4.3 04/23/2017 0810   CL 102 04/23/2017 0810   CO2 26 04/23/2017 0810   GLUCOSE 82 04/23/2017 0810   GLUCOSE 127 (H) 11/16/2014 1625   BUN 13 04/23/2017 0810   CREATININE 0.68 04/23/2017 0810   CREATININE 0.62 01/20/2014 1317   CALCIUM 9.1 04/23/2017 0810   GFRNONAA 109  04/23/2017 0810   GFRAA 126 04/23/2017 0810   Lab Results  Component Value Date   HGBA1C 5.5 04/23/2017   HGBA1C 5.3 12/26/2016   HGBA1C 5.5 02/22/2016   Lab Results  Component Value Date   INSULIN 24.6 04/23/2017   INSULIN 18.1 12/26/2016   CBC    Component Value Date/Time   WBC 6.7 12/26/2016 0957   WBC 8.9 11/16/2014 1625   RBC 4.64 12/26/2016 0957   RBC 4.34 11/16/2014 1625   HGB 12.9 12/26/2016 0957   HCT 39.7 12/26/2016 0957   PLT 389 (H) 01/26/2015 0823   MCV 86 12/26/2016 0957   MCH 27.8 12/26/2016 0957   MCH 27.0 11/16/2014 1625   MCHC 32.5 12/26/2016 0957   MCHC 31.8 11/16/2014 1625   RDW 15.2 12/26/2016 0957   LYMPHSABS 1.7 12/26/2016 0957   EOSABS 0.1 12/26/2016 0957   BASOSABS 0.0 12/26/2016 0957   Iron/TIBC/Ferritin/ %Sat    Component Value Date/Time   FERRITIN 21 02/22/2016 0823   Lipid Panel     Component Value Date/Time   CHOL 147 04/23/2017 0810   TRIG 75 04/23/2017 0810   HDL 33 (L) 04/23/2017 0810   CHOLHDL 3.6 02/22/2016 0823   CHOLHDL 4.6 01/20/2014 1317   VLDL 22 01/20/2014 1317   LDLCALC 99 04/23/2017 0810   Hepatic Function Panel     Component Value Date/Time   PROT 6.8 04/23/2017 0810   ALBUMIN 3.9 04/23/2017 0810   AST 16 04/23/2017 0810   ALT 17 04/23/2017 0810   ALKPHOS 65  04/23/2017 0810   BILITOT 0.3 04/23/2017 0810   BILIDIR 0.09 02/22/2016 0823   IBILI 0.2 01/20/2014 1317      Component Value Date/Time   TSH 2.220 12/26/2016 0957   TSH 3.130 02/22/2016 0823   TSH 2.880 01/26/2015 0823  Results for GEORGANN, BRAMBLE (MRN [id number]) as of 09/10/2017 14:31  Ref. Range 04/23/2017 08:10  Vitamin D, 25-Hydroxy Latest Ref Range: 30.0 - 100.0 ng/mL 22.3 (L)    ASSESSMENT AND PLAN: PCOS (polycystic ovarian syndrome) - Plan: Comprehensive metabolic panel, Hemoglobin A1c, Insulin, random, Lipid Panel With LDL/HDL Ratio  Vitamin D deficiency - Plan: VITAMIN D 25 Hydroxy (Vit-D Deficiency, Fractures)  At risk for diabetes mellitus  Class 2 severe obesity with serious comorbidity and body mass index (BMI) of 39.0 to 39.9 in adult, unspecified obesity type (HCC)  PLAN:  Polycystic Ovary Syndrome We will check labs and Christina Rodriguez will continue her medications and diet. Christina Rodriguez agrees to follow up with our clinic in 2 to 3 weeks.  Diabetes risk counselling Christina Rodriguez was given extended (15 minutes) diabetes prevention counseling today. She is 42 y.o. female and has risk factors for diabetes including obesity and polycystic ovary syndrome. We discussed intensive lifestyle modifications today with an emphasis on weight loss as well as increasing exercise and decreasing simple carbohydrates in her diet.  Vitamin D Deficiency Christina Rodriguez was informed that low vitamin D levels contributes to fatigue and are associated with obesity, breast, and colon cancer. Christina Rodriguez agrees to continue taking prescription Vit D ,000 IU every week and will follow up for routine testing of vitamin D, at least 2-3 times per year. She was informed of the risk of over-replacement of vitamin D and agrees to not increase her dose unless she discusses this with [place] first. We will check labs and Christina Rodriguez agrees to follow up with our clinic in 2 to 3 weeks.  Obesity Christina Rodriguez is currently in the action stage of  change. As such, her goal is to continue with weight loss efforts She has agreed to keep a food journal with 1500 calories and 90+ grams of protein daily Christina Rodriguez has been instructed to work up to a goal of 150 minutes of combined cardio and strengthening exercise per week or add strengthening exercise to walking per week for weight loss and overall health benefits. We discussed the following Behavioral Modification Strategies today: increasing lean protein intake, increasing vegetables, travel eating strategies, and celebration eating strategies   Christina Rodriguez has agreed to follow up with our clinic in 2 to 3 weeks. She was informed of the importance of frequent follow up visits to maximize her success with intensive lifestyle modifications for her multiple health conditions.   OBESITY BEHAVIORAL INTERVENTION VISIT  Today's visit was # 13 out of 22.  Starting weight: 260 lbs Starting date: 12/26/16 Today's weight : 253 lbs  Today's date: 09/10/2017 Total lbs lost to date: 7 (Patients must lose 7 lbs in the first 6 months to continue with counseling)   ASK: We discussed the diagnosis of obesity with Monick S Czarnecki today and Makailey agreed to give [place] permission to discuss obesity behavioral modification therapy today.  ASSESS: Javayah has the diagnosis of obesity and her BMI today is 39.62 Gladyes is in the action stage of change   ADVISE: Saudia was educated on the multiple health risks of obesity as well as the benefit of weight loss to improve her health. She was advised of the need for long term treatment and the importance of lifestyle modifications.  AGREE: Multiple dietary modification options and treatment options were discussed and  Ahana agreed to the above obesity treatment plan.  I, Burt Knack, am acting as transcriptionist for Quillian Quince, MD  I have reviewed the above documentation for accuracy and completeness, and I agree with the above. -Quillian Quince, MD

## 2017-09-11 LAB — LIPID PANEL WITH LDL/HDL RATIO
Cholesterol, Total: 157 mg/dL (ref 100–199)
HDL: 34 mg/dL — AB (ref >39)
LDL Calculated: 109 mg/dL — ABNORMAL HIGH (ref 0–99)
LDl/HDL Ratio: 3.2 ratio (ref 0.0–3.2)
Triglycerides: 68 mg/dL (ref 0–149)
VLDL Cholesterol Cal: 14 mg/dL (ref 5–40)

## 2017-09-11 LAB — COMPREHENSIVE METABOLIC PANEL
A/G RATIO: 1.5 (ref 1.2–2.2)
ALT: 17 IU/L (ref 0–32)
AST: 15 IU/L (ref 0–40)
Albumin: 4.3 g/dL (ref 3.5–5.5)
Alkaline Phosphatase: 62 IU/L (ref 39–117)
BILIRUBIN TOTAL: 0.4 mg/dL (ref 0.0–1.2)
BUN/Creatinine Ratio: 25 — ABNORMAL HIGH (ref 9–23)
BUN: 15 mg/dL (ref 6–24)
CALCIUM: 9.4 mg/dL (ref 8.7–10.2)
CO2: 21 mmol/L (ref 20–29)
Chloride: 104 mmol/L (ref 96–106)
Creatinine, Ser: 0.61 mg/dL (ref 0.57–1.00)
GFR calc Af Amer: 130 mL/min/{1.73_m2} (ref >59)
GFR, EST NON AFRICAN AMERICAN: 113 mL/min/{1.73_m2} (ref >59)
GLOBULIN, TOTAL: 2.8 g/dL (ref 1.5–4.5)
Glucose: 87 mg/dL (ref 65–99)
POTASSIUM: 4.2 mmol/L (ref 3.5–5.2)
SODIUM: 141 mmol/L (ref 134–144)
Total Protein: 7.1 g/dL (ref 6.0–8.5)

## 2017-09-11 LAB — INSULIN, RANDOM: INSULIN: 24.7 u[IU]/mL (ref 2.6–24.9)

## 2017-09-11 LAB — VITAMIN D 25 HYDROXY (VIT D DEFICIENCY, FRACTURES): VIT D 25 HYDROXY: 21.8 ng/mL — AB (ref 30.0–100.0)

## 2017-09-11 LAB — HEMOGLOBIN A1C
ESTIMATED AVERAGE GLUCOSE: 108 mg/dL
Hgb A1c MFr Bld: 5.4 % (ref 4.8–5.6)

## 2017-10-01 ENCOUNTER — Ambulatory Visit (INDEPENDENT_AMBULATORY_CARE_PROVIDER_SITE_OTHER): Payer: 59 | Admitting: Family Medicine

## 2017-10-01 VITALS — BP 98/71 | HR 71 | Temp 98.1°F | Ht 67.0 in | Wt 252.0 lb

## 2017-10-01 DIAGNOSIS — E66812 Obesity, class 2: Secondary | ICD-10-CM

## 2017-10-01 DIAGNOSIS — R031 Nonspecific low blood-pressure reading: Secondary | ICD-10-CM

## 2017-10-01 DIAGNOSIS — Z6839 Body mass index (BMI) 39.0-39.9, adult: Secondary | ICD-10-CM | POA: Diagnosis not present

## 2017-10-01 DIAGNOSIS — E8881 Metabolic syndrome: Secondary | ICD-10-CM

## 2017-10-01 DIAGNOSIS — E559 Vitamin D deficiency, unspecified: Secondary | ICD-10-CM | POA: Diagnosis not present

## 2017-10-01 DIAGNOSIS — E88819 Insulin resistance, unspecified: Secondary | ICD-10-CM

## 2017-10-01 DIAGNOSIS — Z9189 Other specified personal risk factors, not elsewhere classified: Secondary | ICD-10-CM | POA: Diagnosis not present

## 2017-10-01 MED ORDER — VITAMIN D (ERGOCALCIFEROL) 1.25 MG (50000 UNIT) PO CAPS: 50000.0000 [IU] | ORAL_CAPSULE | ORAL | 0 refills | Status: DC

## 2017-10-01 MED FILL — PANTOPRAZOLE SOD DR 40 MG T: 40 | 90 days supply | Qty: 90 | Fill #1

## 2017-10-01 MED FILL — HYDROCHLOROTHIAZIDE 25 MG T: 25 | 90 days supply | Qty: 90 | Fill #1

## 2017-10-01 MED FILL — METFORMIN HCL ER 500 MG TAB: 500 | 90 days supply | Qty: 180 | Fill #1

## 2017-10-01 MED FILL — VIT D2 1.25 MG (50,000 UNIT: 1.25 MG | 28 days supply | Qty: 4 | Fill #0

## 2017-10-01 MED FILL — VICTOZA 18 MG/3 ML INJECT P: 18 | 90 days supply | Qty: 27 | Fill #1

## 2017-10-02 NOTE — Progress Notes (Signed)
Office: [phone]  /  Fax: [phone]   HPI:   Chief Complaint: OBESITY Christina Rodriguez is here to discuss her progress with her obesity treatment plan. She is on the keep a food journal with 1500 calories and 90+ grams of protein daily and is following her eating plan approximately 95 % of the time. She states she is walking for 30 minutes 3 times per week. Christina Rodriguez is still liking journaling, liking that she can see what she is eating. If she doesn't meet protein goal will supplement with a shake.  Her weight is 252 lb (114.3 kg) today and has had a weight loss of 1 pound over a period of 3 weeks since her last visit. She has lost 8 lbs since starting treatment with [place].  Vitamin D Deficiency Christina Rodriguez has a diagnosis of vitamin D deficiency. She is currently taking prescription Vit D, not a significant improvement even on supplementation. She denies nausea, vomiting or muscle weakness.  At risk for osteopenia and osteoporosis Christina Rodriguez is at higher risk of osteopenia and osteoporosis due to vitamin D deficiency.   Insulin Resistance Christina Rodriguez has a diagnosis of insulin resistance based on her elevated fasting insulin level >5. Although Christina Rodriguez's blood glucose readings are still under good control, insulin resistance puts her at greater risk of metabolic syndrome and diabetes. She is on Victoza and metformin, no significant improvement in insulin level and she continues to work on diet and exercise to decrease risk of diabetes.  Low Blood Pressure Christina Rodriguez's blood pressure is low. She denies dizziness or faintness. She is on diltiazem for SVT and hydrochlorothiazide for lower extremity swelling.  ALLERGIES: Allergies  Allergen Reactions  . Beta Adrenergic Blockers Shortness Of Breath  . Shrimp [Shellfish Allergy] Hives  . Soybeans Hives    MEDICATIONS: Current Outpatient Medications on File Prior to Visit  Medication Sig Dispense Refill  . albuterol (PROVENTIL HFA;VENTOLIN HFA) 108 (90 BASE)  MCG/ACT inhaler Inhale 2 puffs into the lungs every 4 (four) hours as needed for wheezing or shortness of breath. 1 Inhaler 5  . cyanocobalamin 500 MCG tablet Take 500 mcg by mouth daily.    Marland Kitchen diltiazem (CARDIZEM CD) 240 MG 24 hr capsule Take 1 capsule (240 mg total) by mouth daily. 90 capsule 1  . EPINEPHrine 0.3 mg/0.3 mL IJ SOAJ injection Inject 0.3 mg into the muscle once. Reported on 05/03/2015    . fluconazole (DIFLUCAN) 150 MG tablet One po qd prn yeast infection; may repeat in 3-4 days if needed 2 tablet 0  . Fluticasone-Salmeterol (ADVAIR DISKUS) 250-50 MCG/DOSE AEPB Inhale 1 puff into the lungs 2 (two) times daily.      . hydrochlorothiazide (HYDRODIURIL) 25 MG tablet Take 1 tablet (25 mg total) by mouth daily. 90 tablet 1  . ibuprofen (ADVIL,MOTRIN) 200 MG tablet Take 400 mg by mouth every 6 (six) hours as needed for headache, mild pain or moderate pain.    . Insulin Pen Needle (PEN NEEDLES) 32G X 5 MM MISC Inject 1.8 mg into the skin daily. 100 each 0  . liraglutide (VICTOZA) 18 MG/3ML SOPN Take  1.8 mg subcu qd 9 pen 1  . metFORMIN (GLUCOPHAGE-XR) 500 MG 24 hr tablet TAKE 2 TABLETS (1,000 MG TOTAL) BY MOUTH DAILY WITH BREAKFAST. 180 tablet 1  . Multiple Vitamins-Minerals (MULTIVITAMIN ADULT PO) Take 1 tablet by mouth daily.    . pantoprazole (PROTONIX) 40 MG tablet Take 1 tablet (40 mg total) by mouth daily. 90 tablet 1  . rizatriptan (MAXALT-MLT)  10 MG disintegrating tablet Take 1 tablet (10 mg total) by mouth as needed for migraine. May repeat in 2 hours if needed; max 2 per 24 hours 27 tablet 1   No current facility-administered medications on file prior to visit.     PAST MEDICAL HISTORY: Past Medical History:  Diagnosis Date  . Adenomyosis   . Anemia    HIstory of   . Asthma    albuterol inhaler prn-recent uri-using more freq  . Dysrhythmia    history of svt-controlled on cardizem-diagnosed in 1999-  . Gall bladder disease   . GERD (gastroesophageal reflux disease)     uses protonix  . Headache    migraines   . History of polycystic ovarian disease   . Infertility associated with anovulation   . Metabolic syndrome   . Multiple food allergies    shrimp  . Osteoarthritis   . Palpitations   . PCOS (polycystic ovarian syndrome)   . PONV (postoperative nausea and vomiting)   . Prediabetes   . Recurrent upper respiratory infection (URI)    resolving  . SVT (supraventricular tachycardia) (HCC)    History of   . Vitamin D deficiency   . VSD (ventricular septal defect)    history of -no problems    PAST SURGICAL HISTORY: Past Surgical History:  Procedure Laterality Date  . CESAREAN SECTION  2010  . CESAREAN SECTION  11/16/2010   Procedure: CESAREAN SECTION;  Surgeon: Almon Hercules;  Location: [facility];  Service: Gynecology;  Laterality: N/A;  Repeat   . CHOLECYSTECTOMY  2003  . DILATION AND CURETTAGE OF UTERUS    . HYSTEROSCOPY W/D&C N/A 12/06/2014   Procedure: DILATATION AND CURETTAGE /HYSTEROSCOPY;  Surgeon: Waynard Reeds, MD;  Location: [facility];  Service: Gynecology;  Laterality: N/A;  . uterine polyps      SOCIAL HISTORY: Social History   Tobacco Use  . Smoking status: Never Smoker  . Smokeless tobacco: Never Used  Substance Use Topics  . Alcohol use: No  . Drug use: No    FAMILY HISTORY: Family History  Problem Relation Age of Onset  . Diabetes Father   . Hypertension Father   . Hyperlipidemia Father   . Sleep apnea Father   . Obesity Father     ROS: Review of Systems  Constitutional: Positive for weight loss.  Gastrointestinal: Negative for nausea and vomiting.  Musculoskeletal:       Negative muscle weakness  Neurological: Negative for dizziness.       Negative faintness    PHYSICAL EXAM: Blood pressure 98/71, pulse 71, temperature 98.1 F (36.7 C), temperature source Oral, height  (1.702 m), weight 252 lb (114.3 kg), SpO2 99 %. Body mass index is 39.47 kg/m. Physical Exam  Constitutional: She is oriented to  person, place, and time. She appears well-developed and well-nourished.  Cardiovascular: Normal rate.  Pulmonary/Chest: Effort normal.  Musculoskeletal: Normal range of motion.  Neurological: She is oriented to person, place, and time.  Skin: Skin is warm and dry.  Psychiatric: She has a normal mood and affect. Her behavior is normal.  Vitals reviewed.   RECENT LABS AND TESTS: BMET    Component Value Date/Time   NA 141 09/10/2017 1032   K 4.2 09/10/2017 1032   CL 104 09/10/2017 1032   CO2 21 09/10/2017 1032   GLUCOSE 87 09/10/2017 1032   GLUCOSE 127 (H) 11/16/2014 1625   BUN 15 09/10/2017 1032   CREATININE 0.61 09/10/2017 1032   CREATININE  0.62 01/20/2014 1317   CALCIUM 9.4 09/10/2017 1032   GFRNONAA 113 09/10/2017 1032   GFRAA 130 09/10/2017 1032   Lab Results  Component Value Date   HGBA1C 5.4 09/10/2017   HGBA1C 5.5 04/23/2017   HGBA1C 5.3 12/26/2016   HGBA1C 5.5 02/22/2016   Lab Results  Component Value Date   INSULIN 24.7 09/10/2017   INSULIN 24.6 04/23/2017   INSULIN 18.1 12/26/2016   CBC    Component Value Date/Time   WBC 6.7 12/26/2016 0957   WBC 8.9 11/16/2014 1625   RBC 4.64 12/26/2016 0957   RBC 4.34 11/16/2014 1625   HGB 12.9 12/26/2016 0957   HCT 39.7 12/26/2016 0957   PLT 389 (H) 01/26/2015 0823   MCV 86 12/26/2016 0957   MCH 27.8 12/26/2016 0957   MCH 27.0 11/16/2014 1625   MCHC 32.5 12/26/2016 0957   MCHC 31.8 11/16/2014 1625   RDW 15.2 12/26/2016 0957   LYMPHSABS 1.7 12/26/2016 0957   EOSABS 0.1 12/26/2016 0957   BASOSABS 0.0 12/26/2016 0957   Iron/TIBC/Ferritin/ %Sat    Component Value Date/Time   FERRITIN 21 02/22/2016 0823   Lipid Panel     Component Value Date/Time   CHOL 157 09/10/2017 1032   TRIG 68 09/10/2017 1032   HDL 34 (L) 09/10/2017 1032   CHOLHDL 3.6 02/22/2016 0823   CHOLHDL 4.6 01/20/2014 1317   VLDL 22 01/20/2014 1317   LDLCALC 109 (H) 09/10/2017 1032   Hepatic Function Panel     Component Value Date/Time     PROT 7.1 09/10/2017 1032   ALBUMIN 4.3 09/10/2017 1032   AST 15 09/10/2017 1032   ALT 17 09/10/2017 1032   ALKPHOS 62 09/10/2017 1032   BILITOT 0.4 09/10/2017 1032   BILIDIR 0.09 02/22/2016 0823   IBILI 0.2 01/20/2014 1317      Component Value Date/Time   TSH 2.220 12/26/2016 0957   TSH 3.130 02/22/2016 0823   TSH 2.880 01/26/2015 0823  Results for JORITA, BOHANON (MRN [id number]) as of 10/02/2017 09:23  Ref. Range 09/10/2017 10:32  Vitamin D, 25-Hydroxy Latest Ref Range: 30.0 - 100.0 ng/mL 21.8 (L)    ASSESSMENT AND PLAN: Vitamin D deficiency - Plan: Vitamin D, Ergocalciferol, (DRISDOL) 50000 units CAPS capsule  Insulin resistance  Low blood pressure reading  At risk for osteoporosis  Class 2 severe obesity with serious comorbidity and body mass index (BMI) of 39.0 to 39.9 in adult, unspecified obesity type (HCC)  PLAN:  Vitamin D Deficiency Christina Rodriguez was informed that low vitamin D levels contributes to fatigue and are associated with obesity, breast, and colon cancer. Christina Rodriguez agrees to continue taking prescription Vit D ,000 IU every week #4 and we will refill for 1 month. She will follow up for routine testing of vitamin D, at least 2-3 times per year. She was informed of the risk of over-replacement of vitamin D and agrees to not increase her dose unless she discusses this with [place] first. Christina Rodriguez agrees to follow up with our clinic in 2 to 3 weeks.  At risk for osteopenia and osteoporosis Christina Rodriguez is at risk for osteopenia and osteoporsis due to her vitamin D deficiency. She was encouraged to take her vitamin D and follow her higher calcium diet and increase strengthening exercise to help strengthen her bones and decrease her risk of osteopenia and osteoporosis.  Insulin Resistance Christina Rodriguez will continue to work on weight loss, exercise, and decreasing simple carbohydrates in her diet to help decrease the risk of diabetes.  We dicussed metformin including benefits and risks. She  was informed that eating too many simple carbohydrates or too many calories at one sitting increases the likelihood of GI side effects. Christina Rodriguez agrees to continue Victoza and metformin and she agrees to follow up with our clinic in 2 to 3 weeks as directed to monitor her progress.  Low Blood Pressure We will follow up on blood pressure at next appointment. Christina Rodriguez agrees to follow up with our clinic in 2 to 3 weeks.  Obesity Christina Rodriguez is currently in the action stage of change. As such, her goal is to continue with weight loss efforts She has agreed to keep a food journal with 1500 calories and 90 grams of protein daily Christina Rodriguez has been instructed to work up to a goal of 150 minutes of combined cardio and strengthening exercise per week or resistance training for 5-10 minutes 2-3 times per week for weight loss and overall health benefits. We discussed the following Behavioral Modification Strategies today: increasing lean protein intake, increasing vegetables, work on meal planning and easy cooking plans, and keep a strict food journal   Malone has agreed to follow up with our clinic in 2 to 3 weeks. She was informed of the importance of frequent follow up visits to maximize her success with intensive lifestyle modifications for her multiple health conditions.   OBESITY BEHAVIORAL INTERVENTION VISIT  Today's visit was # 14 out of 22.  Starting weight: 260 lbs Starting date: 12/26/16 Today's weight : 252 lbs  Today's date: 10/01/2017 Total lbs lost to date: 8 (Patients must lose 7 lbs in the first 6 months to continue with counseling)   ASK: We discussed the diagnosis of obesity with Christina Rodriguez today and Christina Rodriguez agreed to give [place] permission to discuss obesity behavioral modification therapy today.  ASSESS: Christina Rodriguez has the diagnosis of obesity and her BMI today is 39.46 Christina Rodriguez is in the action stage of change   ADVISE: Christina Rodriguez was educated on the multiple health risks of obesity as well as  the benefit of weight loss to improve her health. She was advised of the need for long term treatment and the importance of lifestyle modifications.  AGREE: Multiple dietary modification options and treatment options were discussed and  Christina Rodriguez agreed to the above obesity treatment plan.  I, Burt Knack, am acting as transcriptionist for Debbra Riding, MD  I have reviewed the above documentation for accuracy and completeness, and I agree with the above. - Debbra Riding, MD

## 2017-10-10 ENCOUNTER — Other Ambulatory Visit: Payer: Self-pay | Admitting: Nurse Practitioner

## 2017-10-10 MED ORDER — FLUCONAZOLE 150 MG PO TABS: ORAL_TABLET | ORAL | 0 refills | Status: DC

## 2017-10-10 MED ORDER — AZITHROMYCIN 250 MG PO TABS: ORAL_TABLET | ORAL | 0 refills | Status: DC

## 2017-10-10 NOTE — Progress Notes (Signed)
Given Rx for Zmax and Diflucan for her upcoming vacation. Office visit if any problems.

## 2017-10-22 ENCOUNTER — Other Ambulatory Visit: Payer: Self-pay | Admitting: Nurse Practitioner

## 2017-10-22 MED ORDER — AMOXICILLIN-POT CLAVULANATE 875-125 MG PO TABS: 1.0000 | ORAL_TABLET | Freq: Two times a day (BID) | ORAL | 0 refills | Status: DC

## 2017-10-22 NOTE — Progress Notes (Signed)
Seen today for frontal area head and bilateral ear pain. No known fever. Minimal cough.  Exam: Rt TM: clear effusion. Lt TM: 1/3 TM has erythema. Pharynx injected with light green/cloudy PND. Neck supple with mild anterior adenopathy.  Will send in antibiotic. Plan to start Prednisone by end of the week if pressure is no better. Recheck if worsens or persists.

## 2017-10-23 ENCOUNTER — Ambulatory Visit (INDEPENDENT_AMBULATORY_CARE_PROVIDER_SITE_OTHER): Payer: 59 | Admitting: Family Medicine

## 2017-10-23 VITALS — BP 106/73 | HR 74 | Temp 97.9°F | Ht 67.0 in | Wt 252.0 lb

## 2017-10-23 DIAGNOSIS — E559 Vitamin D deficiency, unspecified: Secondary | ICD-10-CM

## 2017-10-23 DIAGNOSIS — Z01419 Encounter for gynecological examination (general) (routine) without abnormal findings: Secondary | ICD-10-CM | POA: Diagnosis not present

## 2017-10-23 DIAGNOSIS — Z6839 Body mass index (BMI) 39.0-39.9, adult: Secondary | ICD-10-CM

## 2017-10-23 DIAGNOSIS — Z1231 Encounter for screening mammogram for malignant neoplasm of breast: Secondary | ICD-10-CM | POA: Diagnosis not present

## 2017-10-23 DIAGNOSIS — Z9189 Other specified personal risk factors, not elsewhere classified: Secondary | ICD-10-CM

## 2017-10-23 DIAGNOSIS — Z124 Encounter for screening for malignant neoplasm of cervix: Secondary | ICD-10-CM | POA: Diagnosis not present

## 2017-10-23 MED ORDER — VITAMIN D (ERGOCALCIFEROL) 1.25 MG (50000 UNIT) PO CAPS: 50000.0000 [IU] | ORAL_CAPSULE | ORAL | 0 refills | Status: DC

## 2017-10-23 NOTE — Progress Notes (Signed)
Office: [phone]  /  Fax: [phone]   HPI:   Chief Complaint: OBESITY Christina Rodriguez is here to discuss her progress with her obesity treatment plan. She is on the  keep a food journal with 1500 calories and 90 grams of protein daily and is following her eating plan approximately 90 % of the time. She states she is swimming, walking and biking for 30 minutes 3 to 4 times per week. Christina Rodriguez did well maintaining weight, even on vacation, where she mostly controlled her portions, made smarter food choices and increased activity. She is ready to get back on track with journaling. Her weight is 252 lb (114.3 kg) today and has maintained weight over a period ofd 3 weeks since her last visit. She has lost 8 lbs since starting treatment with [place].  Vitamin D deficiency Christina Rodriguez has a diagnosis of vitamin D deficiency. Christina Rodriguez is not at goal, even on prescription vit D for over three months. She admits fatigue and denies nausea, vomiting or muscle weakness.  At risk for osteopenia and osteoporosis Christina Rodriguez is at higher risk of osteopenia and osteoporosis due to vitamin D deficiency.   ALLERGIES: Allergies  Allergen Reactions  . Beta Adrenergic Blockers Shortness Of Breath  . Shrimp [Shellfish Allergy] Hives  . Soybeans Hives    MEDICATIONS: Current Outpatient Medications on File Prior to Visit  Medication Sig Dispense Refill  . albuterol (PROVENTIL HFA;VENTOLIN HFA) 108 (90 BASE) MCG/ACT inhaler Inhale 2 puffs into the lungs every 4 (four) hours as needed for wheezing or shortness of breath. 1 Inhaler 5  . amoxicillin-clavulanate (AUGMENTIN) 875-125 MG tablet Take 1 tablet by mouth 2 (two) times daily. 20 tablet 0  . cyanocobalamin 500 MCG tablet Take 500 mcg by mouth daily.    Christina Rodriguez diltiazem (CARDIZEM CD) 240 MG 24 hr capsule Take 1 capsule (240 mg total) by mouth daily. 90 capsule 1  . EPINEPHrine 0.3 mg/0.3 mL IJ SOAJ injection Inject 0.3 mg into the muscle once. Reported on 05/03/2015    .  Fluticasone-Salmeterol (ADVAIR DISKUS) 250-50 MCG/DOSE AEPB Inhale 1 puff into the lungs 2 (two) times daily.      . hydrochlorothiazide (HYDRODIURIL) 25 MG tablet Take 1 tablet (25 mg total) by mouth daily. 90 tablet 1  . ibuprofen (ADVIL,MOTRIN) 200 MG tablet Take 400 mg by mouth every 6 (six) hours as needed for headache, mild pain or moderate pain.    . Insulin Pen Needle (PEN NEEDLES) 32G X 5 MM MISC Inject 1.8 mg into the skin daily. 100 each 0  . liraglutide (VICTOZA) 18 MG/3ML SOPN Take  1.8 mg subcu qd 9 pen 1  . metFORMIN (GLUCOPHAGE-XR) 500 MG 24 hr tablet TAKE 2 TABLETS (1,000 MG TOTAL) BY MOUTH DAILY WITH BREAKFAST. 180 tablet 1  . Multiple Vitamins-Minerals (MULTIVITAMIN ADULT PO) Take 1 tablet by mouth daily.    . pantoprazole (PROTONIX) 40 MG tablet Take 1 tablet (40 mg total) by mouth daily. 90 tablet 1  . rizatriptan (MAXALT-MLT) 10 MG disintegrating tablet Take 1 tablet (10 mg total) by mouth as needed for migraine. May repeat in 2 hours if needed; max 2 per 24 hours 27 tablet 1  . Vitamin D, Ergocalciferol, (DRISDOL) 50000 units CAPS capsule Take 1 capsule (50,000 Units total) by mouth every 7 (seven) days. 4 capsule 0   No current facility-administered medications on file prior to visit.     PAST MEDICAL HISTORY: Past Medical History:  Diagnosis Date  . Adenomyosis   . Anemia  HIstory of   . Asthma    albuterol inhaler prn-recent uri-using more freq  . Dysrhythmia    history of svt-controlled on cardizem-diagnosed in 1999-  . Gall bladder disease   . GERD (gastroesophageal reflux disease)    uses protonix  . Headache    migraines   . History of polycystic ovarian disease   . Infertility associated with anovulation   . Metabolic syndrome   . Multiple food allergies    shrimp  . Osteoarthritis   . Palpitations   . PCOS (polycystic ovarian syndrome)   . PONV (postoperative nausea and vomiting)   . Prediabetes   . Recurrent upper respiratory infection (URI)      resolving  . SVT (supraventricular tachycardia) (HCC)    History of   . Vitamin D deficiency   . VSD (ventricular septal defect)    history of -no problems    PAST SURGICAL HISTORY: Past Surgical History:  Procedure Laterality Date  . CESAREAN SECTION  2010  . CESAREAN SECTION  11/16/2010   Procedure: CESAREAN SECTION;  Surgeon: Almon Hercules;  Location: [facility];  Service: Gynecology;  Laterality: N/A;  Repeat   . CHOLECYSTECTOMY  2003  . DILATION AND CURETTAGE OF UTERUS    . HYSTEROSCOPY W/D&C N/A 12/06/2014   Procedure: DILATATION AND CURETTAGE /HYSTEROSCOPY;  Surgeon: Waynard Reeds, MD;  Location: [facility];  Service: Gynecology;  Laterality: N/A;  . uterine polyps      SOCIAL HISTORY: Social History   Tobacco Use  . Smoking status: Never Smoker  . Smokeless tobacco: Never Used  Substance Use Topics  . Alcohol use: No  . Drug use: No    FAMILY HISTORY: Family History  Problem Relation Age of Onset  . Diabetes Father   . Hypertension Father   . Hyperlipidemia Father   . Sleep apnea Father   . Obesity Father     ROS: Review of Systems  Constitutional: Positive for malaise/fatigue. Negative for weight loss.  Gastrointestinal: Negative for nausea and vomiting.  Musculoskeletal:       Negative for muscle weakness    PHYSICAL EXAM: Blood pressure 106/73, pulse 74, temperature 97.9 F (36.6 C), temperature source Oral, height 5\' 7"  (1.702 m), weight 252 lb (114.3 kg), SpO2 100 %. Body mass index is 39.47 kg/m. Physical Exam  Constitutional: She is oriented to person, place, and time. She appears well-developed and well-nourished.  Cardiovascular: Normal rate.  Pulmonary/Chest: Effort normal.  Musculoskeletal: Normal range of motion.  Neurological: She is oriented to person, place, and time.  Skin: Skin is warm and dry.  Psychiatric: She has a normal mood and affect. Her behavior is normal.  Vitals reviewed.   RECENT LABS AND TESTS: BMET    Component Value  Date/Time   NA 141 09/10/2017 1032   K 4.2 09/10/2017 1032   CL 104 09/10/2017 1032   CO2 21 09/10/2017 1032   GLUCOSE 87 09/10/2017 1032   GLUCOSE 127 (H) 11/16/2014 1625   BUN 15 09/10/2017 1032   CREATININE 0.61 09/10/2017 1032   CREATININE 0.62 01/20/2014 1317   CALCIUM 9.4 09/10/2017 1032   GFRNONAA 113 09/10/2017 1032   GFRAA 130 09/10/2017 1032   Lab Results  Component Value Date   HGBA1C 5.4 09/10/2017   HGBA1C 5.5 04/23/2017   HGBA1C 5.3 12/26/2016   HGBA1C 5.5 02/22/2016   Lab Results  Component Value Date   INSULIN 24.7 09/10/2017   INSULIN 24.6 04/23/2017   INSULIN 18.1 12/26/2016  CBC    Component Value Date/Time   WBC 6.7 12/26/2016 0957   WBC 8.9 11/16/2014 1625   RBC 4.64 12/26/2016 0957   RBC 4.34 11/16/2014 1625   HGB 12.9 12/26/2016 0957   HCT 39.7 12/26/2016 0957   PLT 389 (H) 01/26/2015 0823   MCV 86 12/26/2016 0957   MCH 27.8 12/26/2016 0957   MCH 27.0 11/16/2014 1625   MCHC 32.5 12/26/2016 0957   MCHC 31.8 11/16/2014 1625   RDW 15.2 12/26/2016 0957   LYMPHSABS 1.7 12/26/2016 0957   EOSABS 0.1 12/26/2016 0957   BASOSABS 0.0 12/26/2016 0957   Iron/TIBC/Ferritin/ %Sat    Component Value Date/Time   FERRITIN 21 02/22/2016 0823   Lipid Panel     Component Value Date/Time   CHOL 157 09/10/2017 1032   TRIG 68 09/10/2017 1032   HDL 34 (L) 09/10/2017 1032   CHOLHDL 3.6 02/22/2016 0823   CHOLHDL 4.6 01/20/2014 1317   VLDL 22 01/20/2014 1317   LDLCALC 109 (H) 09/10/2017 1032   Hepatic Function Panel     Component Value Date/Time   PROT 7.1 09/10/2017 1032   ALBUMIN 4.3 09/10/2017 1032   AST 15 09/10/2017 1032   ALT 17 09/10/2017 1032   ALKPHOS 62 09/10/2017 1032   BILITOT 0.4 09/10/2017 1032   BILIDIR 0.09 02/22/2016 0823   IBILI 0.2 01/20/2014 1317      Component Value Date/Time   TSH 2.220 12/26/2016 0957   TSH 3.130 02/22/2016 0823   TSH 2.880 01/26/2015 0823   Results for IEESHA, ABBASI (MRN [id number]) as of 10/23/2017  10:07  Ref. Range 09/10/2017 10:32  Vitamin D, 25-Hydroxy Latest Ref Range: 30.0 - 100.0 ng/mL 21.8 (L)   ASSESSMENT AND PLAN: Vitamin D deficiency - Plan: Vitamin D, Ergocalciferol, (DRISDOL) 50000 units CAPS capsule  At risk for osteoporosis  Class 2 severe obesity with serious comorbidity and body mass index (BMI) of 39.0 to 39.9 in adult, unspecified obesity type (HCC)  PLAN:  Vitamin D Deficiency Kymari was informed that low vitamin D levels contributes to fatigue and are associated with obesity, breast, and colon cancer. She agrees to increase prescription Vit D @50 ,000 IU to every 3 days #10 with no refills. We will recheck labs in 3 months and she will follow up for routine testing of vitamin D, at least 2-3 times per year. She was informed of the risk of over-replacement of vitamin D and agrees to not increase her dose unless she discusses this with [place] first. Nevaeha agrees to follow up as directed.  At risk for osteopenia and osteoporosis Karrine is at risk for osteopenia and osteoporosis due to her vitamin D deficiency. She was encouraged to take her vitamin D and follow her higher calcium diet and increase strengthening exercise to help strengthen her bones and decrease her risk of osteopenia and osteoporosis.  Obesity Violeta is currently in the action stage of change. As such, her goal is to continue with weight loss efforts She has agreed to keep a food journal with 1500 calories and 90+ grams of protein daily Becki has been instructed to work up to a goal of 150 minutes of combined cardio and strengthening exercise per week for weight loss and overall health benefits. We discussed the following Behavioral Modification Strategies today: increasing lean protein intake and decreasing simple carbohydrates   Jessamyn has agreed to follow up with our clinic in 2 to 3 weeks. She was informed of the importance of frequent follow up visits  to maximize her success with intensive lifestyle  modifications for her multiple health conditions.   OBESITY BEHAVIORAL INTERVENTION VISIT  Today's visit was # 15 out of 22.  Starting weight: 260 lbs Starting date: 12/26/16 Today's weight : 252 lbs Today's date: 10/23/2017 Total lbs lost to date: 8 (Patients must lose 7 lbs in the first 6 months to continue with counseling)   ASK: We discussed the diagnosis of obesity with Erla S Dowen today and Alaena agreed to give us permission to discuss obesity behavioral modification therapy today.  ASSESS: Maclovia has the diagnosis of obesity and her BMI today is 39.46 Darnelle is in the action stage of change   ADVISE: Pricsilla was educated on the multiple health risks of obesity as well as the benefit of weight loss to improve her health. She was advised of the need for long term treatment and the importance of lifestyle modifications.  AGREE: Multiple dietary modification options and treatment options were discussed and  Railyn agreed to the above obesity treatment plan.  I, [place] CraneJoanne Murray, am acting as transcriptionist for Quillian Quincearen Akansha Wyche, MD  I have reviewed the above documentation for accuracy and completeness, and I agree with the above. -Quillian Quincearen Taia Bramlett, MD

## 2017-11-18 ENCOUNTER — Ambulatory Visit (INDEPENDENT_AMBULATORY_CARE_PROVIDER_SITE_OTHER): Payer: 59 | Admitting: Physician Assistant

## 2017-11-18 ENCOUNTER — Ambulatory Visit (INDEPENDENT_AMBULATORY_CARE_PROVIDER_SITE_OTHER): Payer: 59 | Admitting: Family Medicine

## 2017-11-18 VITALS — BP 111/73 | HR 83 | Temp 98.2°F | Ht 67.0 in | Wt 249.0 lb

## 2017-11-18 DIAGNOSIS — E559 Vitamin D deficiency, unspecified: Secondary | ICD-10-CM | POA: Diagnosis not present

## 2017-11-18 DIAGNOSIS — Z6839 Body mass index (BMI) 39.0-39.9, adult: Secondary | ICD-10-CM | POA: Diagnosis not present

## 2017-11-18 NOTE — Progress Notes (Addendum)
Office: [phone]  /  Fax: [phone]   HPI:   Chief Complaint: OBESITY Christina Rodriguez is here to discuss her progress with her obesity treatment plan. She is on the  keep a food journal with 1500 calories and 90+ grams of protein daily and is following her eating plan approximately 95 % of the time. She states she is lifting weights and walking 30 minutes 4 to 5 times per week. Christina Rodriguez is doing very well with weight loss. She reports enjoying counting calories and keeping track of protein. She began lifting light weights and walking. Her weight is 249 lb (112.9 kg) today and has had a weight loss of 3 pounds over a period of 3 to 4 weeks since her last visit. She has lost 11 lbs since starting treatment with [place].  Vitamin D deficiency Christina Rodriguez has a diagnosis of vitamin D deficiency. She is currently taking vit D and denies nausea, vomiting or muscle weakness.  ALLERGIES: Allergies  Allergen Reactions  . Beta Adrenergic Blockers Shortness Of Breath  . Shrimp [Shellfish Allergy] Hives  . Soybeans Hives    MEDICATIONS: Current Outpatient Medications on File Prior to Visit  Medication Sig Dispense Refill  . albuterol (PROVENTIL HFA;VENTOLIN HFA) 108 (90 BASE) MCG/ACT inhaler Inhale 2 puffs into the lungs every 4 (four) hours as needed for wheezing or shortness of breath. 1 Inhaler 5  . cyanocobalamin 500 MCG tablet Take 500 mcg by mouth daily.    Marland Kitchen diltiazem (CARDIZEM CD) 240 MG 24 hr capsule Take 1 capsule (240 mg total) by mouth daily. 90 capsule 1  . EPINEPHrine 0.3 mg/0.3 mL IJ SOAJ injection Inject 0.3 mg into the muscle once. Reported on 05/03/2015    . Fluticasone-Salmeterol (ADVAIR DISKUS) 250-50 MCG/DOSE AEPB Inhale 1 puff into the lungs 2 (two) times daily.      . hydrochlorothiazide (HYDRODIURIL) 25 MG tablet Take 1 tablet (25 mg total) by mouth daily. 90 tablet 1  . ibuprofen (ADVIL,MOTRIN) 200 MG tablet Take 400 mg by mouth every 6 (six) hours as needed for headache, mild pain  or moderate pain.    . Insulin Pen Needle (PEN NEEDLES) 32G X 5 MM MISC Inject 1.8 mg into the skin daily. 100 each 0  . liraglutide (VICTOZA) 18 MG/3ML SOPN Take  1.8 mg subcu qd 9 pen 1  . metFORMIN (GLUCOPHAGE-XR) 500 MG 24 hr tablet TAKE 2 TABLETS (1,000 MG TOTAL) BY MOUTH DAILY WITH BREAKFAST. 180 tablet 1  . Multiple Vitamins-Minerals (MULTIVITAMIN ADULT PO) Take 1 tablet by mouth daily.    . pantoprazole (PROTONIX) 40 MG tablet Take 1 tablet (40 mg total) by mouth daily. 90 tablet 1  . rizatriptan (MAXALT-MLT) 10 MG disintegrating tablet Take 1 tablet (10 mg total) by mouth as needed for migraine. May repeat in 2 hours if needed; max 2 per 24 hours 27 tablet 1  . Vitamin D, Ergocalciferol, (DRISDOL) 50000 units CAPS capsule Take 1 capsule (50,000 Units total) by mouth every 3 (three) days. 10 capsule 0   No current facility-administered medications on file prior to visit.     PAST MEDICAL HISTORY: Past Medical History:  Diagnosis Date  . Adenomyosis   . Anemia    HIstory of   . Asthma    albuterol inhaler prn-recent uri-using more freq  . Dysrhythmia    history of svt-controlled on cardizem-diagnosed in 1999-  . Gall bladder disease   . GERD (gastroesophageal reflux disease)    uses protonix  . Headache  migraines   . History of polycystic ovarian disease   . Infertility associated with anovulation   . Metabolic syndrome   . Multiple food allergies    shrimp  . Osteoarthritis   . Palpitations   . PCOS (polycystic ovarian syndrome)   . PONV (postoperative nausea and vomiting)   . Prediabetes   . Recurrent upper respiratory infection (URI)    resolving  . SVT (supraventricular tachycardia) (HCC)    History of   . Vitamin D deficiency   . VSD (ventricular septal defect)    history of -no problems    PAST SURGICAL HISTORY: Past Surgical History:  Procedure Laterality Date  . CESAREAN SECTION  2010  . CESAREAN SECTION  11/16/2010   Procedure: CESAREAN SECTION;   Surgeon: Almon HerculesKendra H. Ross;  Location: [facility];  Service: Gynecology;  Laterality: N/A;  Repeat   . CHOLECYSTECTOMY  2003  . DILATION AND CURETTAGE OF UTERUS    . HYSTEROSCOPY W/D&C N/A 12/06/2014   Procedure: DILATATION AND CURETTAGE /HYSTEROSCOPY;  Surgeon: [name], MD;  Location: [facility];  Service: Gynecology;  Laterality: N/A;  . uterine polyps      SOCIAL HISTORY: Social History   Tobacco Use  . Smoking status: Never Smoker  . Smokeless tobacco: Never Used  Substance Use Topics  . Alcohol use: No  . Drug use: No    FAMILY HISTORY: Family History  Problem Relation Age of Onset  . Diabetes Father   . Hypertension Father   . Hyperlipidemia Father   . Sleep apnea Father   . Obesity Father     ROS: Review of Systems  Constitutional: Positive for weight loss.  Gastrointestinal: Negative for nausea and vomiting.  Musculoskeletal:       Negative for muscle weakness    PHYSICAL EXAM: Blood pressure 111/73, pulse 83, temperature 98.2 F (36.8 C), temperature source Oral, height 5\' 7"  (1.702 m), weight 249 lb (112.9 kg), SpO2 98 %. Body mass index is 39 kg/m. Physical Exam  Constitutional: She is oriented to person, place, and time. She appears well-developed and well-nourished.  Cardiovascular: Normal rate.  Pulmonary/Chest: Effort normal.  Musculoskeletal: Normal range of motion.  Neurological: She is oriented to person, place, and time.  Skin: Skin is warm and dry.  Psychiatric: She has a normal mood and affect. Her behavior is normal.  Vitals reviewed.   RECENT LABS AND TESTS: BMET    Component Value Date/Time   NA 141 09/10/2017 1032   K 4.2 09/10/2017 1032   CL 104 09/10/2017 1032   CO2 21 09/10/2017 1032   GLUCOSE 87 09/10/2017 1032   GLUCOSE 127 (H) 11/16/2014 1625   BUN 15 09/10/2017 1032   CREATININE 0.61 09/10/2017 1032   CREATININE 0.62 01/20/2014 1317   CALCIUM 9.4 09/10/2017 1032   GFRNONAA 113 09/10/2017 1032   GFRAA 130 09/10/2017 1032    Lab Results  Component Value Date   HGBA1C 5.4 09/10/2017   HGBA1C 5.5 04/23/2017   HGBA1C 5.3 12/26/2016   HGBA1C 5.5 02/22/2016   Lab Results  Component Value Date   INSULIN 24.7 09/10/2017   INSULIN 24.6 04/23/2017   INSULIN 18.1 12/26/2016   CBC    Component Value Date/Time   WBC 6.7 12/26/2016 0957   WBC 8.9 11/16/2014 1625   RBC 4.64 12/26/2016 0957   RBC 4.34 11/16/2014 1625   HGB 12.9 12/26/2016 0957   HCT 39.7 12/26/2016 0957   PLT 389 (H) 01/26/2015 0823   MCV 86  12/26/2016 0957   MCH 27.8 12/26/2016 0957   MCH 27.0 11/16/2014 1625   MCHC 32.5 12/26/2016 0957   MCHC 31.8 11/16/2014 1625   RDW 15.2 12/26/2016 0957   LYMPHSABS 1.7 12/26/2016 0957   EOSABS 0.1 12/26/2016 0957   BASOSABS 0.0 12/26/2016 0957   Iron/TIBC/Ferritin/ %Sat    Component Value Date/Time   FERRITIN 21 02/22/2016 0823   Lipid Panel     Component Value Date/Time   CHOL 157 09/10/2017 1032   TRIG 68 09/10/2017 1032   HDL 34 (L) 09/10/2017 1032   CHOLHDL 3.6 02/22/2016 0823   CHOLHDL 4.6 01/20/2014 1317   VLDL 22 01/20/2014 1317   LDLCALC 109 (H) 09/10/2017 1032   Hepatic Function Panel     Component Value Date/Time   PROT 7.1 09/10/2017 1032   ALBUMIN 4.3 09/10/2017 1032   AST 15 09/10/2017 1032   ALT 17 09/10/2017 1032   ALKPHOS 62 09/10/2017 1032   BILITOT 0.4 09/10/2017 1032   BILIDIR 0.09 02/22/2016 0823   IBILI 0.2 01/20/2014 1317      Component Value Date/Time   TSH 2.220 12/26/2016 0957   TSH 3.130 02/22/2016 0823   TSH 2.880 01/26/2015 0823   Results for AMADA, HALLISEY (MRN [id number]) as of 11/18/2017 15:11  Ref. Range 09/10/2017 10:32  Vitamin D, 25-Hydroxy Latest Ref Range: 30.0 - 100.0 ng/mL 21.8 (L)   ASSESSMENT AND PLAN: Vitamin D deficiency  Class 2 severe obesity with serious comorbidity and body mass index (BMI) of 39.0 to 39.9 in adult, unspecified obesity type (HCC)  PLAN:  Vitamin D Deficiency Christina Rodriguez was informed that low vitamin D levels  contributes to fatigue and are associated with obesity, breast, and colon cancer. She agrees to continue to take prescription Vit D @50 ,000 IU every week. We will recheck vitamin D level in 1 to 2 months and she will follow up for routine testing of vitamin D, at least 2-3 times per year. She was informed of the risk of over-replacement of vitamin D and agrees to not increase her dose unless she discusses this with [place] first.  We spent > than 50% of the 15 minute visit on the counseling as documented in the note.  Obesity Christina Rodriguez is currently in the action stage of change. As such, her goal is to continue with weight loss efforts She has agreed to keep a food journal with 1500 calories and 90 grams of protein daily Christina Rodriguez has been instructed to work up to a goal of 150 minutes of combined cardio and strengthening exercise per week for weight loss and overall health benefits. We discussed the following Behavioral Modification Strategies today: work on meal planning and easy cooking plans and travel eating strategies   Christina Rodriguez has agreed to follow up with our clinic in 2 to 3 weeks. She was informed of the importance of frequent follow up visits to maximize her success with intensive lifestyle modifications for her multiple health conditions.   OBESITY BEHAVIORAL INTERVENTION VISIT  Today's visit was # 16 out of 22.  Starting weight: 260 lbs Starting date: 12/26/16 Today's weight : 249 lbs  Today's date: 11/18/2017 Total lbs lost to date: 74    ASK: We discussed the diagnosis of obesity with Wandalee S Bellanger today and Christina Rodriguez agreed to give [place] permission to discuss obesity behavioral modification therapy today.  ASSESS: Christina Rodriguez has the diagnosis of obesity and her BMI today is 38.99 Christina Rodriguez is in the action stage of change   ADVISE:  Christina Rodriguez was educated on the multiple health risks of obesity as well as the benefit of weight loss to improve her health. She was advised of the need for long term  treatment and the importance of lifestyle modifications.  AGREE: Multiple dietary modification options and treatment options were discussed and  Christina Rodriguez agreed to the above obesity treatment plan.  Cristi Loron, am acting as transcriptionist for Alois Cliche, PA-C I, Alois Cliche, PA-C have reviewed above note and agree with its content

## 2017-11-20 ENCOUNTER — Ambulatory Visit: Payer: 59 | Admitting: Nurse Practitioner

## 2017-11-20 ENCOUNTER — Encounter: Payer: Self-pay | Admitting: Nurse Practitioner

## 2017-11-20 VITALS — BP 130/88 | Ht 67.0 in | Wt 249.0 lb

## 2017-11-20 DIAGNOSIS — E282 Polycystic ovarian syndrome: Secondary | ICD-10-CM

## 2017-11-20 DIAGNOSIS — E161 Other hypoglycemia: Secondary | ICD-10-CM | POA: Diagnosis not present

## 2017-11-20 DIAGNOSIS — R609 Edema, unspecified: Secondary | ICD-10-CM

## 2017-11-20 DIAGNOSIS — E559 Vitamin D deficiency, unspecified: Secondary | ICD-10-CM | POA: Diagnosis not present

## 2017-11-20 IMAGING — MG 2D DIGITAL SCREENING BILATERAL MAMMOGRAM WITH CAD AND ADJUNCT TO
8 of 12 series · 8 of 28 positions shown · non-contrast
Comparison: Previous exam(s).

ACR Breast Density Category a: The breast tissue is almost entirely
fatty.

CLINICAL DATA: Screening.

EXAM:
2D DIGITAL SCREENING BILATERAL MAMMOGRAM WITH CAD AND ADJUNCT TOMO

[R MLO]
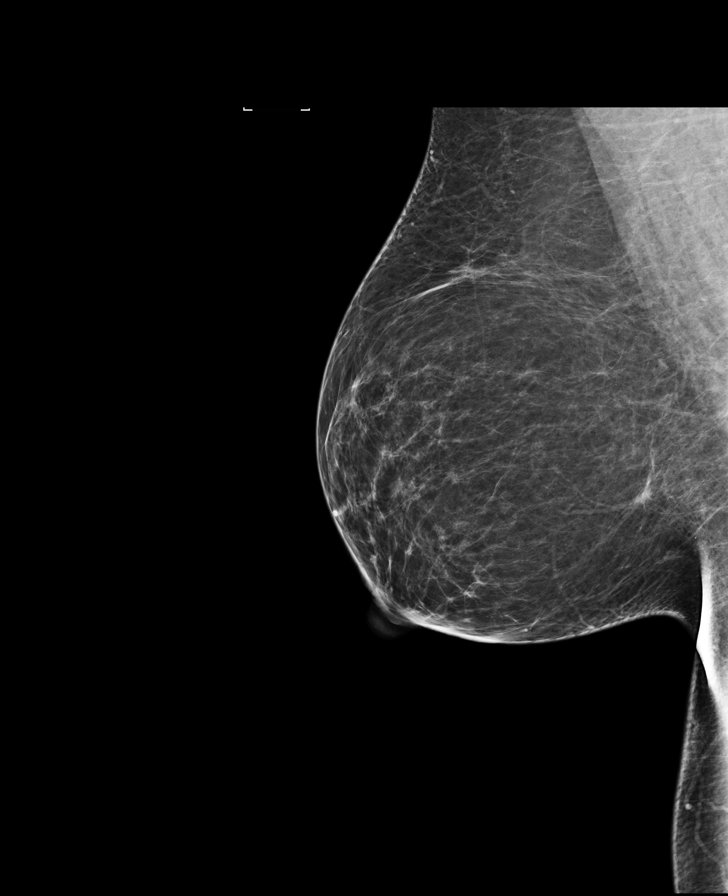

[L CC]
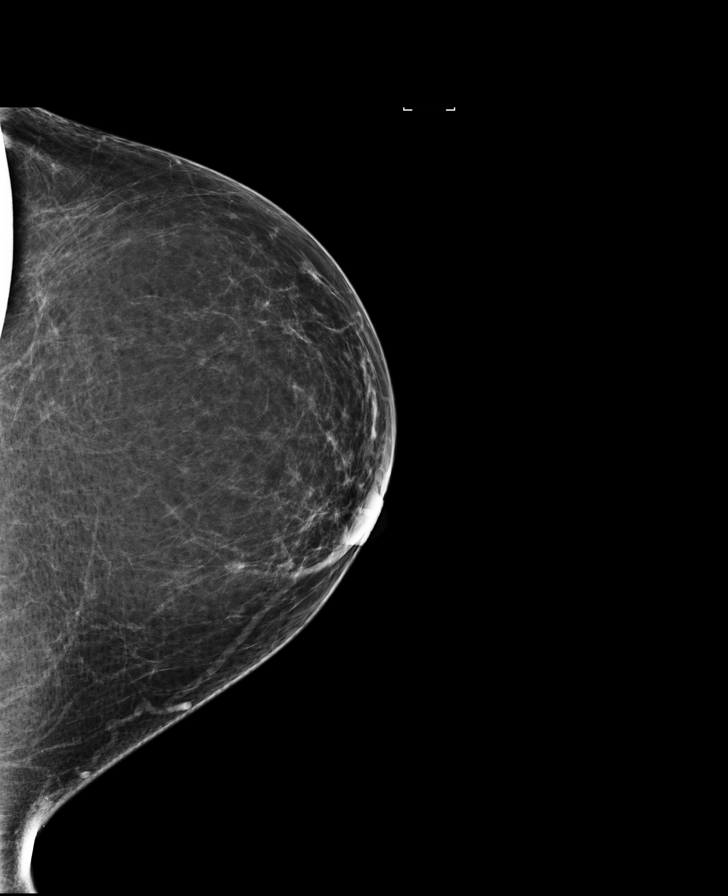

[L CC synth-2D]
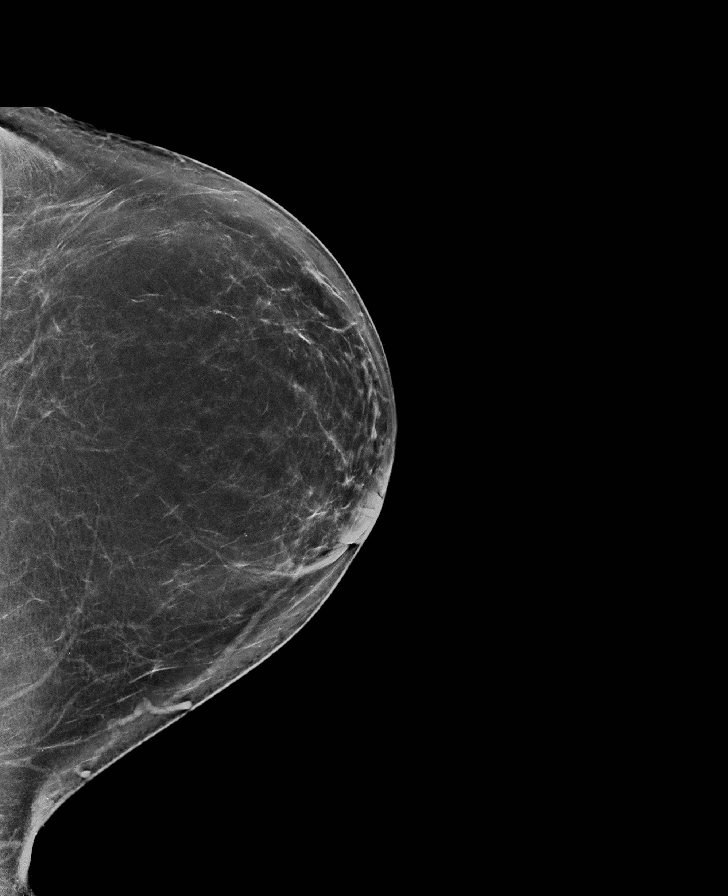

[L MLO]
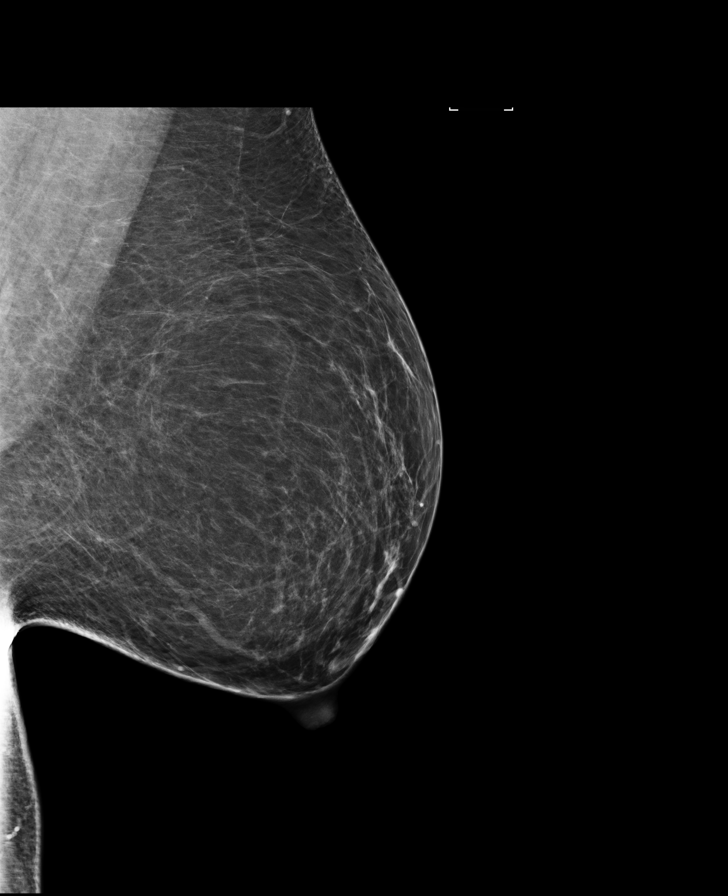

[R MLO synth-2D]
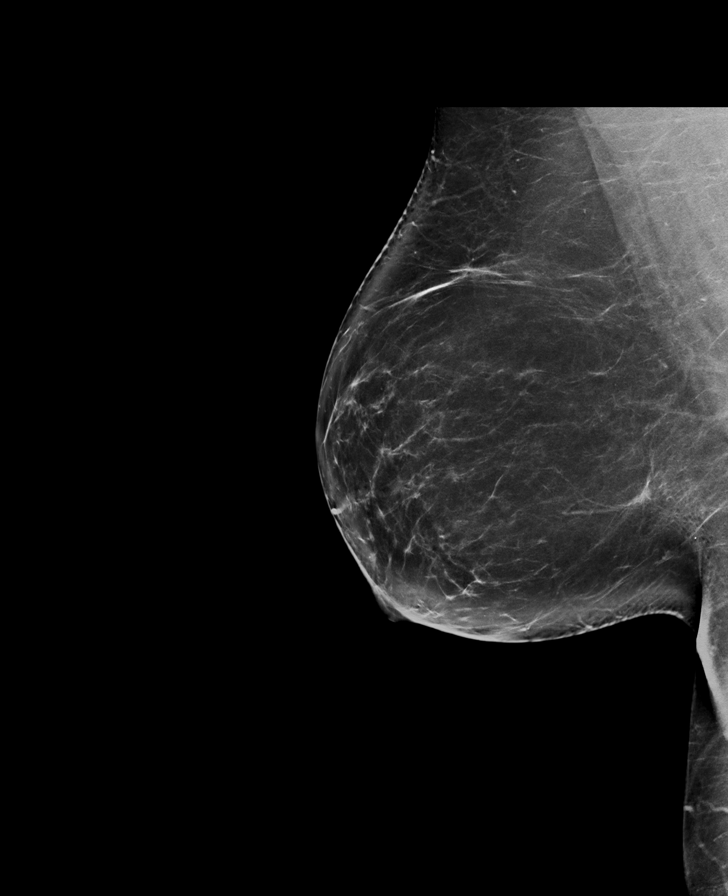

[R CC synth-2D]
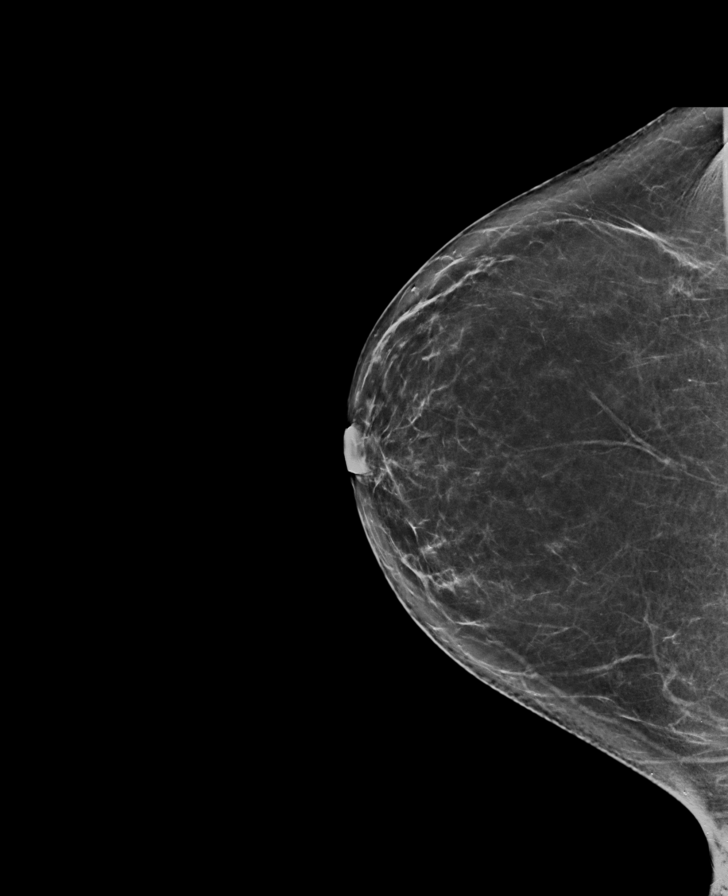

[L MLO synth-2D]
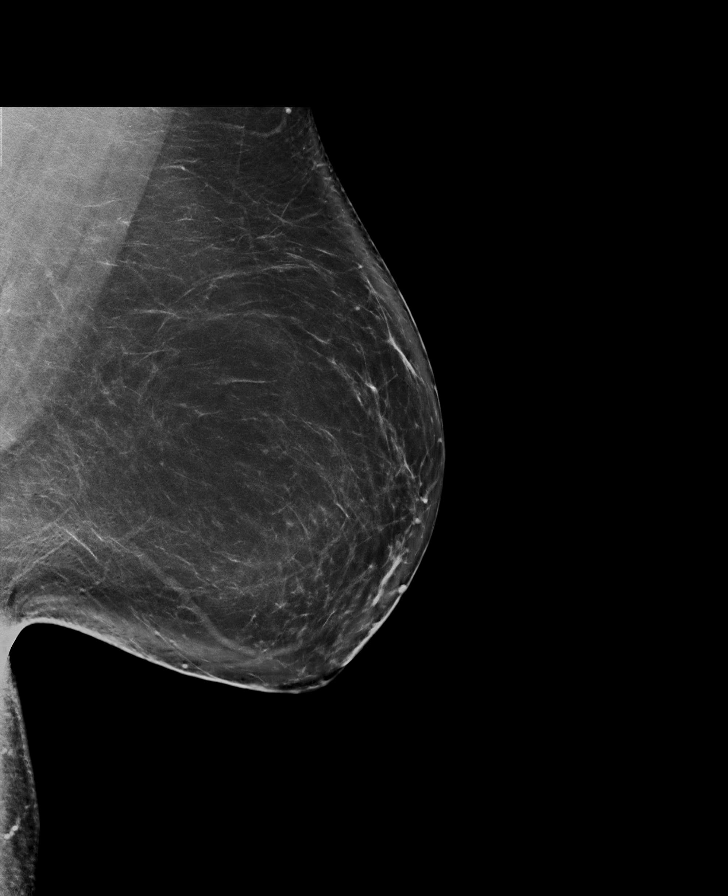

[R CC]
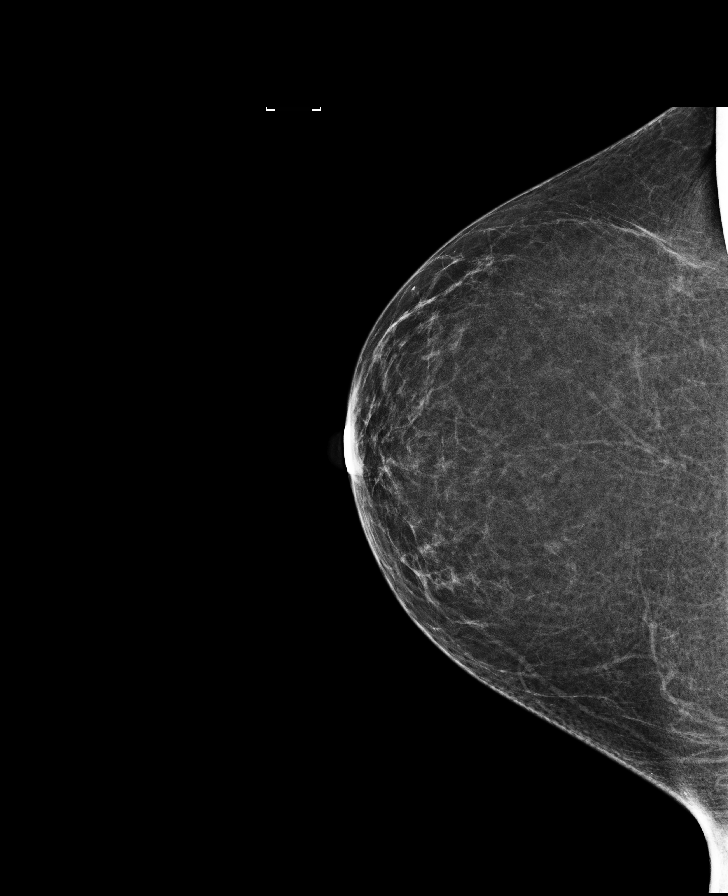

[8 of 28 positions shown; findings below may reference images not displayed]

FINDINGS: There are no findings suspicious for malignancy. Images were
processed with CAD.
IMPRESSION: No mammographic evidence of malignancy. A result letter of this
screening mammogram will be mailed directly to the patient.

RECOMMENDATION:
Screening mammogram in one year. (Code:[id number])

BI-RADS CATEGORY  1: Negative.

## 2017-11-20 MED ORDER — HYDROCHLOROTHIAZIDE 25 MG PO TABS: 25.0000 mg | ORAL_TABLET | Freq: Every day | ORAL | 3 refills | Status: DC

## 2017-11-20 MED ORDER — PEN NEEDLES 32G X 5 MM MISC: 1.8000 mg | Freq: Every day | 3 refills | Status: DC

## 2017-11-20 MED ORDER — PANTOPRAZOLE SODIUM 40 MG PO TBEC: 40.0000 mg | DELAYED_RELEASE_TABLET | Freq: Every day | ORAL | 3 refills | Status: DC

## 2017-11-20 MED ORDER — METFORMIN HCL ER 500 MG PO TB24: 1000.0000 mg | ORAL_TABLET | Freq: Every day | ORAL | 3 refills | Status: DC

## 2017-11-20 MED ORDER — LIRAGLUTIDE 18 MG/3ML ~~LOC~~ SOPN: PEN_INJECTOR | SUBCUTANEOUS | 3 refills | Status: DC

## 2017-11-21 ENCOUNTER — Encounter: Payer: Self-pay | Admitting: Nurse Practitioner

## 2017-11-21 NOTE — Progress Notes (Signed)
Subjective:  Presents for routine follow up of PCOS and hyperinsulinemia. Continues monthly follow up with the medical weight loss clinic. PCOS and hormone issues also being followed by GYN. Was placed on vitamin D by weight loss clinic. Trying to stay active and lose weight. Adherent to medication regimen.  HCTZ helps with peripheral edema.  GERD well controlled with Protonix.  Objective:   BP 130/88   Ht 5\' 7"  (1.702 m)   Wt 249 lb (112.9 kg)   BMI 39.00 kg/m  NAD.  Alert, oriented.  Lungs clear.  Heart regular rate and rhythm.  Abdomen soft nondistended nontender.  Lower extremities no edema.  Assessment:   Problem List Items Addressed This Visit      Digestive   Hyperinsulinemia - Primary     Endocrine   PCOS (polycystic ovarian syndrome)     Other   Morbid obesity (HCC)   Relevant Medications   liraglutide (VICTOZA) 18 MG/3ML SOPN   metFORMIN (GLUCOPHAGE-XR) 500 MG 24 hr tablet   Vitamin D deficiency       Plan:   Meds ordered this encounter  Medications  . hydrochlorothiazide (HYDRODIURIL) 25 MG tablet    Sig: Take 1 tablet (25 mg total) by mouth daily.    Dispense:  90 tablet    Refill:  3    Order Specific Question:   Supervising Provider    Answer:   Merlyn AlbertLUKING, WILLIAM S [[id number]]  . Insulin Pen Needle (PEN NEEDLES) 32G X 5 MM MISC    Sig: Inject 1.8 mg into the skin daily.    Dispense:  100 each    Refill:  3    Order Specific Question:   Supervising Provider    Answer:   Merlyn AlbertLUKING, WILLIAM S [[id number]]  . liraglutide (VICTOZA) 18 MG/3ML SOPN    Sig: Take  1.8 mg subcu qd    Dispense:  9 pen    Refill:  3    Please dispense 90 day supply    Order Specific Question:   Supervising Provider    Answer:   Merlyn AlbertLUKING, WILLIAM S [[id number]]  . metFORMIN (GLUCOPHAGE-XR) 500 MG 24 hr tablet    Sig: Take 2 tablets (1,000 mg total) by mouth daily with breakfast.    Dispense:  180 tablet    Refill:  3    Order Specific Question:   Supervising Provider    Answer:   Merlyn AlbertLUKING, WILLIAM S  [[id number]]  . pantoprazole (PROTONIX) 40 MG tablet    Sig: Take 1 tablet (40 mg total) by mouth daily.    Dispense:  90 tablet    Refill:  3    Order Specific Question:   Supervising Provider    Answer:   Merlyn AlbertLUKING, WILLIAM S [[id number]]   Routine labs done 09/10/2017 by weight loss clinic. Encouraged continued weight loss and activity. Continue follow up with GI and weight loss clinic.  Return in about 1 year (around 11/21/2018) for recheck.

## 2017-12-09 ENCOUNTER — Ambulatory Visit (INDEPENDENT_AMBULATORY_CARE_PROVIDER_SITE_OTHER): Payer: 59 | Admitting: Family Medicine

## 2017-12-09 VITALS — BP 102/70 | HR 78 | Temp 97.9°F | Ht 67.0 in | Wt 248.0 lb

## 2017-12-09 DIAGNOSIS — E8881 Metabolic syndrome: Secondary | ICD-10-CM | POA: Diagnosis not present

## 2017-12-09 DIAGNOSIS — Z6838 Body mass index (BMI) 38.0-38.9, adult: Secondary | ICD-10-CM

## 2017-12-09 NOTE — Progress Notes (Signed)
Office: [phone]  /  Fax: [phone]   HPI:   Chief Complaint: OBESITY Christina Rodriguez is here to discuss her progress with her obesity treatment plan. She is on the keep a food journal with 1500 calories and 90 grams of protein daily and is following her eating plan approximately 95 % of the time. She states she is walking and lifting weights for 30 minutes 3 times per week. Christina Rodriguez continues to lose weight but struggling with meal planning and prepping. She is also getting a bit bored with meals and would like more ideas.  Her weight is 248 lb (112.5 kg) today and has had a weight loss of 1 pound over a period of 3 weeks since her last visit. She has lost 12 lbs since starting treatment with [place].  Insulin Resistance Christina Rodriguez has a diagnosis of insulin resistance based on her elevated fasting insulin level >5. Although Christina Rodriguez's blood glucose readings are still under good control, insulin resistance puts her at greater risk of metabolic syndrome and diabetes. Christina Rodriguez is stable on metformin and Victoza, she denies nausea, vomiting, or hypoglycemia. She notes polyphagia improved. She continues to work on diet and exercise to decrease risk of diabetes.  ALLERGIES: Allergies  Allergen Reactions  . Beta Adrenergic Blockers Shortness Of Breath  . Shrimp [Shellfish Allergy] Hives  . Soybeans Hives    MEDICATIONS: Current Outpatient Medications on File Prior to Visit  Medication Sig Dispense Refill  . albuterol (PROVENTIL HFA;VENTOLIN HFA) 108 (90 BASE) MCG/ACT inhaler Inhale 2 puffs into the lungs every 4 (four) hours as needed for wheezing or shortness of breath. 1 Inhaler 5  . cyanocobalamin 500 MCG tablet Take 500 mcg by mouth daily.    Marland Kitchen diltiazem (CARDIZEM CD) 240 MG 24 hr capsule Take 1 capsule (240 mg total) by mouth daily. 90 capsule 1  . EPINEPHrine 0.3 mg/0.3 mL IJ SOAJ injection Inject 0.3 mg into the muscle once. Reported on 05/03/2015    . Fluticasone-Salmeterol (ADVAIR DISKUS) 250-50  MCG/DOSE AEPB Inhale 1 puff into the lungs 2 (two) times daily.      . hydrochlorothiazide (HYDRODIURIL) 25 MG tablet Take 1 tablet (25 mg total) by mouth daily. 90 tablet 3  . ibuprofen (ADVIL,MOTRIN) 200 MG tablet Take 400 mg by mouth every 6 (six) hours as needed for headache, mild pain or moderate pain.    . Insulin Pen Needle (PEN NEEDLES) 32G X 5 MM MISC Inject 1.8 mg into the skin daily. 100 each 3  . liraglutide (VICTOZA) 18 MG/3ML SOPN Take  1.8 mg subcu qd 9 pen 3  . metFORMIN (GLUCOPHAGE-XR) 500 MG 24 hr tablet Take 2 tablets (1,000 mg total) by mouth daily with breakfast. 180 tablet 3  . Multiple Vitamins-Minerals (MULTIVITAMIN ADULT PO) Take 1 tablet by mouth daily.    . pantoprazole (PROTONIX) 40 MG tablet Take 1 tablet (40 mg total) by mouth daily. 90 tablet 3  . rizatriptan (MAXALT-MLT) 10 MG disintegrating tablet Take 1 tablet (10 mg total) by mouth as needed for migraine. May repeat in 2 hours if needed; max 2 per 24 hours 27 tablet 1  . Vitamin D, Ergocalciferol, (DRISDOL) 50000 units CAPS capsule Take 1 capsule (50,000 Units total) by mouth every 3 (three) days. 10 capsule 0   No current facility-administered medications on file prior to visit.     PAST MEDICAL HISTORY: Past Medical History:  Diagnosis Date  . Adenomyosis   . Anemia    HIstory of   . Asthma  albuterol inhaler prn-recent uri-using more freq  . Dysrhythmia    history of svt-controlled on cardizem-diagnosed in 1999-  . Gall bladder disease   . GERD (gastroesophageal reflux disease)    uses protonix  . Headache    migraines   . History of polycystic ovarian disease   . Infertility associated with anovulation   . Metabolic syndrome   . Multiple food allergies    shrimp  . Osteoarthritis   . Palpitations   . PCOS (polycystic ovarian syndrome)   . PONV (postoperative nausea and vomiting)   . Prediabetes   . Recurrent upper respiratory infection (URI)    resolving  . SVT (supraventricular  tachycardia) (HCC)    History of   . Vitamin D deficiency   . VSD (ventricular septal defect)    history of -no problems    PAST SURGICAL HISTORY: Past Surgical History:  Procedure Laterality Date  . CESAREAN SECTION  2010  . CESAREAN SECTION  11/16/2010   Procedure: CESAREAN SECTION;  Surgeon: Almon Hercules;  Location: [facility];  Service: Gynecology;  Laterality: N/A;  Repeat   . CHOLECYSTECTOMY  2003  . DILATION AND CURETTAGE OF UTERUS    . HYSTEROSCOPY W/D&C N/A 12/06/2014   Procedure: DILATATION AND CURETTAGE /HYSTEROSCOPY;  Surgeon: Waynard Reeds, MD;  Location: [facility];  Service: Gynecology;  Laterality: N/A;  . uterine polyps      SOCIAL HISTORY: Social History   Tobacco Use  . Smoking status: Never Smoker  . Smokeless tobacco: Never Used  Substance Use Topics  . Alcohol use: No  . Drug use: No    FAMILY HISTORY: Family History  Problem Relation Age of Onset  . Diabetes Father   . Hypertension Father   . Hyperlipidemia Father   . Sleep apnea Father   . Obesity Father     ROS: Review of Systems  Constitutional: Positive for weight loss.  Gastrointestinal: Negative for nausea and vomiting.  Endo/Heme/Allergies:       Negative hypoglycemia Positive polyphagia    PHYSICAL EXAM: Blood pressure 102/70, pulse 78, temperature 97.9 F (36.6 C), temperature source Oral, height 5\' 7"  (1.702 m), weight 248 lb (112.5 kg), SpO2 98 %. Body mass index is 38.84 kg/m. Physical Exam  Constitutional: She is oriented to person, place, and time. She appears well-developed and well-nourished.  Cardiovascular: Normal rate.  Pulmonary/Chest: Effort normal.  Musculoskeletal: Normal range of motion.  Neurological: She is oriented to person, place, and time.  Skin: Skin is warm and dry.  Psychiatric: She has a normal mood and affect. Her behavior is normal.  Vitals reviewed.   RECENT LABS AND TESTS: BMET    Component Value Date/Time   NA 141 09/10/2017 1032   K 4.2  09/10/2017 1032   CL 104 09/10/2017 1032   CO2 21 09/10/2017 1032   GLUCOSE 87 09/10/2017 1032   GLUCOSE 127 (H) 11/16/2014 1625   BUN 15 09/10/2017 1032   CREATININE 0.61 09/10/2017 1032   CREATININE 0.62 01/20/2014 1317   CALCIUM 9.4 09/10/2017 1032   GFRNONAA 113 09/10/2017 1032   GFRAA 130 09/10/2017 1032   Lab Results  Component Value Date   HGBA1C 5.4 09/10/2017   HGBA1C 5.5 04/23/2017   HGBA1C 5.3 12/26/2016   HGBA1C 5.5 02/22/2016   Lab Results  Component Value Date   INSULIN 24.7 09/10/2017   INSULIN 24.6 04/23/2017   INSULIN 18.1 12/26/2016   CBC    Component Value Date/Time   WBC 6.7 12/26/2016  0957   WBC 8.9 11/16/2014 1625   RBC 4.64 12/26/2016 0957   RBC 4.34 11/16/2014 1625   HGB 12.9 12/26/2016 0957   HCT 39.7 12/26/2016 0957   PLT 389 (H) 01/26/2015 0823   MCV 86 12/26/2016 0957   MCH 27.8 12/26/2016 0957   MCH 27.0 11/16/2014 1625   MCHC 32.5 12/26/2016 0957   MCHC 31.8 11/16/2014 1625   RDW 15.2 12/26/2016 0957   LYMPHSABS 1.7 12/26/2016 0957   EOSABS 0.1 12/26/2016 0957   BASOSABS 0.0 12/26/2016 0957   Iron/TIBC/Ferritin/ %Sat    Component Value Date/Time   FERRITIN 21 02/22/2016 0823   Lipid Panel     Component Value Date/Time   CHOL 157 09/10/2017 1032   TRIG 68 09/10/2017 1032   HDL 34 (L) 09/10/2017 1032   CHOLHDL 3.6 02/22/2016 0823   CHOLHDL 4.6 01/20/2014 1317   VLDL 22 01/20/2014 1317   LDLCALC 109 (H) 09/10/2017 1032   Hepatic Function Panel     Component Value Date/Time   PROT 7.1 09/10/2017 1032   ALBUMIN 4.3 09/10/2017 1032   AST 15 09/10/2017 1032   ALT 17 09/10/2017 1032   ALKPHOS 62 09/10/2017 1032   BILITOT 0.4 09/10/2017 1032   BILIDIR 0.09 02/22/2016 0823   IBILI 0.2 01/20/2014 1317      Component Value Date/Time   TSH 2.220 12/26/2016 0957   TSH 3.130 02/22/2016 0823   TSH 2.880 01/26/2015 0823    ASSESSMENT AND PLAN: Insulin resistance  Class 2 severe obesity with serious comorbidity and body  mass index (BMI) of 38.0 to 38.9 in adult, unspecified obesity type (HCC)  PLAN:  Insulin Resistance Christina Rodriguez will continue to work on weight loss, diet, exercise, and decreasing simple carbohydrates in her diet to help decrease the risk of diabetes. We dicussed metformin including benefits and risks. She was informed that eating too many simple carbohydrates or too many calories at one sitting increases the likelihood of GI side effects. Christina Rodriguez agrees to continue taking her medications and we will check labs in 1 month. She agrees to follow up with our clinic in 4 weeks as directed to monitor her progress.  We spent > than 50% of the 15 minute visit on the counseling as documented in the note.  Obesity Christina Rodriguez is currently in the action stage of change. As such, her goal is to continue with weight loss efforts She has agreed to keep a food journal with 1500 calories and 90 grams of protein daily Christina Rodriguez has been instructed to work up to a goal of 150 minutes of combined cardio and strengthening exercise per week for weight loss and overall health benefits. Recipe ideas given We discussed the following Behavioral Modification Strategies today: increasing lean protein intake, decreasing simple carbohydrates  and work on meal planning and easy cooking plans   Christina Rodriguez has agreed to follow up with our clinic in 4 weeks. She was informed of the importance of frequent follow up visits to maximize her success with intensive lifestyle modifications for her multiple health conditions.   OBESITY BEHAVIORAL INTERVENTION VISIT  Today's visit was # 17 out of 22.  Starting weight: 260 lbs Starting date: 12/26/16 Today's weight : 248 lbs  Today's date: 12/09/2017 Total lbs lost to date: 12    ASK: We discussed the diagnosis of obesity with Christina Rodriguez today and Christina Rodriguez agreed to give [place] permission to discuss obesity behavioral modification therapy today.  ASSESS: Ani has the diagnosis of obesity  and her BMI today is 2338.83 Christina Rodriguez is in the action stage of change   ADVISE: Christina Rodriguez was educated on the multiple health risks of obesity as well as the benefit of weight loss to improve her health. She was advised of the need for long term treatment and the importance of lifestyle modifications.  AGREE: Multiple dietary modification options and treatment options were discussed and  Christina Rodriguez agreed to the above obesity treatment plan.  I, Burt KnackSharon Martin, am acting as transcriptionist for Quillian Quincearen Keron Koffman, MD  I have reviewed the above documentation for accuracy and completeness, and I agree with the above. -Quillian Quincearen Demitria Hay, MD

## 2017-12-22 MED FILL — METFORMIN HCL ER 500 MG TAB: 500 | 90 days supply | Qty: 180 | Fill #0

## 2017-12-22 MED FILL — HYDROCHLOROTHIAZIDE 25 MG T: 25 | 90 days supply | Qty: 90 | Fill #0

## 2017-12-22 MED FILL — VIT D2 1.25 MG (50,000 UNIT: 1.25 MG | 30 days supply | Qty: 10 | Fill #0

## 2017-12-22 MED FILL — VICTOZA 18 MG/3 ML INJECT P: 18 | 90 days supply | Qty: 27 | Fill #0

## 2017-12-30 ENCOUNTER — Telehealth: Payer: Self-pay | Admitting: *Deleted

## 2017-12-30 MED ORDER — FLUCONAZOLE 150 MG PO TABS: ORAL_TABLET | ORAL | 0 refills | Status: DC

## 2017-12-30 NOTE — Telephone Encounter (Signed)
Pt on antibiotics recently and has symptoms of yeast infection. Diflucan 150mg  #2 one po 3 days apart per dr Brett Canalessteve. Med sent to pharm. Pt notified.  [facility] [address]

## 2018-01-02 ENCOUNTER — Telehealth: Payer: Self-pay | Admitting: Obstetrics and Gynecology

## 2018-01-02 MED ORDER — TERCONAZOLE 0.4 % VA CREA: 1.0000 | TOPICAL_CREAM | Freq: Every day | VAGINAL | 0 refills | Status: DC

## 2018-01-02 NOTE — Telephone Encounter (Signed)
Patient is aware 

## 2018-01-02 NOTE — Telephone Encounter (Signed)
Ok may ref 

## 2018-01-02 NOTE — Telephone Encounter (Signed)
[facility] [address]

## 2018-01-02 NOTE — Telephone Encounter (Signed)
Patient has been on an antibx for her sinus infection.And she is having some issues please advise.

## 2018-01-20 ENCOUNTER — Ambulatory Visit (INDEPENDENT_AMBULATORY_CARE_PROVIDER_SITE_OTHER): Payer: 59 | Admitting: Family Medicine

## 2018-01-20 VITALS — BP 111/70 | HR 80 | Temp 98.9°F | Ht 67.0 in | Wt 252.0 lb

## 2018-01-20 DIAGNOSIS — E559 Vitamin D deficiency, unspecified: Secondary | ICD-10-CM | POA: Diagnosis not present

## 2018-01-20 DIAGNOSIS — Z6839 Body mass index (BMI) 39.0-39.9, adult: Secondary | ICD-10-CM

## 2018-01-20 DIAGNOSIS — E8881 Metabolic syndrome: Secondary | ICD-10-CM

## 2018-01-20 DIAGNOSIS — Z8041 Family history of malignant neoplasm of ovary: Secondary | ICD-10-CM | POA: Diagnosis not present

## 2018-01-20 DIAGNOSIS — Z803 Family history of malignant neoplasm of breast: Secondary | ICD-10-CM | POA: Diagnosis not present

## 2018-01-21 NOTE — Progress Notes (Signed)
Office: [phone]  /  Fax: [phone]   HPI:   Chief Complaint: OBESITY Christina Rodriguez is here to discuss her progress with her obesity treatment plan. She is on the keep a food journal with 1500 calories and 90 grams of protein daily and is following her eating plan approximately 90 % of the time. She states she is exercising 30 minutes 3 times per week. Christina Rodriguez reports she just had a large lunch. She hits calories daily and tries to get protein in. She is probably getting about 70-80 grams of protein in per day. She has had to eat out more recently secondary to kids schedule.  Her weight is 252 lb (114.3 kg) today and has gained 4 pounds since her last visit. She has lost 8 lbs since starting treatment with [place].  Vitamin D Deficiency Christina Rodriguez has a diagnosis of vitamin D deficiency. She is currently taking prescription Vit D. She notes fatigue and denies nausea, vomiting or muscle weakness.  Insulin Resistance Christina Rodriguez has a diagnosis of insulin resistance based on her elevated fasting insulin level >5. She states fasting BGs range between 90 and 100's. Although Chapel's blood glucose readings are still under good control, insulin resistance puts her at greater risk of metabolic syndrome and diabetes. She denies GI side effects of metformin or Victoza and continues to work on diet and exercise to decrease risk of diabetes.  ALLERGIES: Allergies  Allergen Reactions  . Beta Adrenergic Blockers Shortness Of Breath  . Shrimp [Shellfish Allergy] Hives  . Soybeans Hives    MEDICATIONS: Current Outpatient Medications on File Prior to Visit  Medication Sig Dispense Refill  . albuterol (PROVENTIL HFA;VENTOLIN HFA) 108 (90 BASE) MCG/ACT inhaler Inhale 2 puffs into the lungs every 4 (four) hours as needed for wheezing or shortness of breath. 1 Inhaler 5  . cyanocobalamin 500 MCG tablet Take 500 mcg by mouth daily.    Marland Kitchen diltiazem (CARDIZEM CD) 240 MG 24 hr capsule Take 1 capsule (240 mg total) by mouth  daily. 90 capsule 1  . EPINEPHrine 0.3 mg/0.3 mL IJ SOAJ injection Inject 0.3 mg into the muscle once. Reported on 05/03/2015    . fluconazole (DIFLUCAN) 150 MG tablet Take one tablet 3 days apart 2 tablet 0  . Fluticasone-Salmeterol (ADVAIR DISKUS) 250-50 MCG/DOSE AEPB Inhale 1 puff into the lungs 2 (two) times daily.      . hydrochlorothiazide (HYDRODIURIL) 25 MG tablet Take 1 tablet (25 mg total) by mouth daily. 90 tablet 3  . ibuprofen (ADVIL,MOTRIN) 200 MG tablet Take 400 mg by mouth every 6 (six) hours as needed for headache, mild pain or moderate pain.    . Insulin Pen Needle (PEN NEEDLES) 32G X 5 MM MISC Inject 1.8 mg into the skin daily. 100 each 3  . liraglutide (VICTOZA) 18 MG/3ML SOPN Take  1.8 mg subcu qd 9 pen 3  . metFORMIN (GLUCOPHAGE-XR) 500 MG 24 hr tablet Take 2 tablets (1,000 mg total) by mouth daily with breakfast. 180 tablet 3  . Multiple Vitamins-Minerals (MULTIVITAMIN ADULT PO) Take 1 tablet by mouth daily.    . pantoprazole (PROTONIX) 40 MG tablet Take 1 tablet (40 mg total) by mouth daily. 90 tablet 3  . rizatriptan (MAXALT-MLT) 10 MG disintegrating tablet Take 1 tablet (10 mg total) by mouth as needed for migraine. May repeat in 2 hours if needed; max 2 per 24 hours 27 tablet 1  . terconazole (TERAZOL 7) 0.4 % vaginal cream Place 1 applicator vaginally at bedtime. 45 g  0  . Vitamin D, Ergocalciferol, (DRISDOL) 50000 units CAPS capsule Take 1 capsule (50,000 Units total) by mouth every 3 (three) days. 10 capsule 0   No current facility-administered medications on file prior to visit.     PAST MEDICAL HISTORY: Past Medical History:  Diagnosis Date  . Adenomyosis   . Anemia    HIstory of   . Asthma    albuterol inhaler prn-recent uri-using more freq  . Dysrhythmia    history of svt-controlled on cardizem-diagnosed in 1999-  . Gall bladder disease   . GERD (gastroesophageal reflux disease)    uses protonix  . Headache    migraines   . History of polycystic  ovarian disease   . Infertility associated with anovulation   . Metabolic syndrome   . Multiple food allergies    shrimp  . Osteoarthritis   . Palpitations   . PCOS (polycystic ovarian syndrome)   . PONV (postoperative nausea and vomiting)   . Prediabetes   . Recurrent upper respiratory infection (URI)    resolving  . SVT (supraventricular tachycardia) (HCC)    History of   . Vitamin D deficiency   . VSD (ventricular septal defect)    history of -no problems    PAST SURGICAL HISTORY: Past Surgical History:  Procedure Laterality Date  . CESAREAN SECTION  2010  . CESAREAN SECTION  11/16/2010   Procedure: CESAREAN SECTION;  Surgeon: Almon HerculesKendra H. Ross;  Location: [facility];  Service: Gynecology;  Laterality: N/A;  Repeat   . CHOLECYSTECTOMY  2003  . DILATION AND CURETTAGE OF UTERUS    . HYSTEROSCOPY W/D&C N/A 12/06/2014   Procedure: DILATATION AND CURETTAGE /HYSTEROSCOPY;  Surgeon: [name], MD;  Location: [facility];  Service: Gynecology;  Laterality: N/A;  . uterine polyps      SOCIAL HISTORY: Social History   Tobacco Use  . Smoking status: Never Smoker  . Smokeless tobacco: Never Used  Substance Use Topics  . Alcohol use: No  . Drug use: No    FAMILY HISTORY: Family History  Problem Relation Age of Onset  . Diabetes Father   . Hypertension Father   . Hyperlipidemia Father   . Sleep apnea Father   . Obesity Father     ROS: Review of Systems  Constitutional: Positive for malaise/fatigue. Negative for weight loss.  Gastrointestinal: Negative for nausea and vomiting.  Musculoskeletal:       Negative muscle weakness  Endo/Heme/Allergies:       Negative hypoglycemia    PHYSICAL EXAM: Blood pressure 111/70, pulse 80, temperature 98.9 F (37.2 C), temperature source Oral, height 5\' 7"  (1.702 m), weight 252 lb (114.3 kg), SpO2 95 %. Body mass index is 39.47 kg/m. Physical Exam  Constitutional: She is oriented to person, place, and time. She appears well-developed and  well-nourished.  Cardiovascular: Normal rate.  Pulmonary/Chest: Effort normal.  Musculoskeletal: Normal range of motion.  Neurological: She is oriented to person, place, and time.  Skin: Skin is warm and dry.  Psychiatric: She has a normal mood and affect. Her behavior is normal.  Vitals reviewed.   RECENT LABS AND TESTS: BMET    Component Value Date/Time   NA 141 09/10/2017 1032   K 4.2 09/10/2017 1032   CL 104 09/10/2017 1032   CO2 21 09/10/2017 1032   GLUCOSE 87 09/10/2017 1032   GLUCOSE 127 (H) 11/16/2014 1625   BUN 15 09/10/2017 1032   CREATININE 0.61 09/10/2017 1032   CREATININE 0.62 01/20/2014 1317  CALCIUM 9.4 09/10/2017 1032   GFRNONAA 113 09/10/2017 1032   GFRAA 130 09/10/2017 1032   Lab Results  Component Value Date   HGBA1C 5.4 09/10/2017   HGBA1C 5.5 04/23/2017   HGBA1C 5.3 12/26/2016   HGBA1C 5.5 02/22/2016   Lab Results  Component Value Date   INSULIN 24.7 09/10/2017   INSULIN 24.6 04/23/2017   INSULIN 18.1 12/26/2016   CBC    Component Value Date/Time   WBC 6.7 12/26/2016 0957   WBC 8.9 11/16/2014 1625   RBC 4.64 12/26/2016 0957   RBC 4.34 11/16/2014 1625   HGB 12.9 12/26/2016 0957   HCT 39.7 12/26/2016 0957   PLT 389 (H) 01/26/2015 0823   MCV 86 12/26/2016 0957   MCH 27.8 12/26/2016 0957   MCH 27.0 11/16/2014 1625   MCHC 32.5 12/26/2016 0957   MCHC 31.8 11/16/2014 1625   RDW 15.2 12/26/2016 0957   LYMPHSABS 1.7 12/26/2016 0957   EOSABS 0.1 12/26/2016 0957   BASOSABS 0.0 12/26/2016 0957   Iron/TIBC/Ferritin/ %Sat    Component Value Date/Time   FERRITIN 21 02/22/2016 0823   Lipid Panel     Component Value Date/Time   CHOL 157 09/10/2017 1032   TRIG 68 09/10/2017 1032   HDL 34 (L) 09/10/2017 1032   CHOLHDL 3.6 02/22/2016 0823   CHOLHDL 4.6 01/20/2014 1317   VLDL 22 01/20/2014 1317   LDLCALC 109 (H) 09/10/2017 1032   Hepatic Function Panel     Component Value Date/Time   PROT 7.1 09/10/2017 1032   ALBUMIN 4.3 09/10/2017  1032   AST 15 09/10/2017 1032   ALT 17 09/10/2017 1032   ALKPHOS 62 09/10/2017 1032   BILITOT 0.4 09/10/2017 1032   BILIDIR 0.09 02/22/2016 0823   IBILI 0.2 01/20/2014 1317      Component Value Date/Time   TSH 2.220 12/26/2016 0957   TSH 3.130 02/22/2016 0823   TSH 2.880 01/26/2015 0823  Results for REISE, HIETALA (MRN [id number]) as of 01/21/2018 11:32  Ref. Range 09/10/2017 10:32  Vitamin D, 25-Hydroxy Latest Ref Range: 30.0 - 100.0 ng/mL 21.8 (L)    ASSESSMENT AND PLAN: Vitamin D deficiency  Insulin resistance  Class 2 severe obesity with serious comorbidity and body mass index (BMI) of 39.0 to 39.9 in adult, unspecified obesity type (HCC)  PLAN:  Vitamin D Deficiency Honore was informed that low vitamin D levels contributes to fatigue and are associated with obesity, breast, and colon cancer. Shanta agrees to continue taking prescription Vit D @50 ,000 IU every 3 days, no refill needed. She will follow up for routine testing of vitamin D, at least 2-3 times per year. She was informed of the risk of over-replacement of vitamin D and agrees to not increase her dose unless she discusses this with [place] first. We will recheck labs at next appointment. Keryl agrees to follow up with our clinic in 4 weeks.  Insulin Resistance Tanveer will continue to work on weight loss, exercise, and decreasing simple carbohydrates in her diet to help decrease the risk of diabetes. We dicussed metformin including benefits and risks. She was informed that eating too many simple carbohydrates or too many calories at one sitting increases the likelihood of GI side effects. Eulalia agrees to continue current medications, no refills needed, and we will recheck labs at next appointment. Bev agrees to follow up with our clinic in 4 weeks as directed to monitor her progress.  I spent > than 50% of the 15 minute visit on counseling  as documented in the note.  Obesity Marisa is currently in questionable action  stage of change. As such, her goal is to continue with weight loss efforts She has agreed to keep a food journal with 1500 calories and 90+ grams of protein daily Indi has been instructed to work up to a goal of 150 minutes of combined cardio and strengthening exercise per week for weight loss and overall health benefits. We discussed the following Behavioral Modification Strategies today: increasing lean protein intake, increasing vegetables, work on meal planning and easy cooking plans, and planning for success   Karcyn has agreed to follow up with our clinic in 4 weeks. She was informed of the importance of frequent follow up visits to maximize her success with intensive lifestyle modifications for her multiple health conditions.   OBESITY BEHAVIORAL INTERVENTION VISIT  Today's visit was # 18   Starting weight: 260 lbs Starting date: 12/26/16 Today's weight : 252 lbs  Today's date: 01/20/2018 Total lbs lost to date: 8    ASK: We discussed the diagnosis of obesity with Marium S Burek today and Zaiyah agreed to give [place] permission to discuss obesity behavioral modification therapy today.  ASSESS: Matalie has the diagnosis of obesity and her BMI today is 39.46 Tesla is in the action stage of change   ADVISE: Caprisha was educated on the multiple health risks of obesity as well as the benefit of weight loss to improve her health. She was advised of the need for long term treatment and the importance of lifestyle modifications to improve her current health and to decrease her risk of future health problems.  AGREE: Multiple dietary modification options and treatment options were discussed and  Deslyn agreed to follow the recommendations documented in the above note.  ARRANGE: Adilenne was educated on the importance of frequent visits to treat obesity as outlined per CMS and USPSTF guidelines and agreed to schedule her next follow up appointment today.  I, Burt Knack, am acting as  transcriptionist for Debbra Riding, MD  I have reviewed the above documentation for accuracy and completeness, and I agree with the above. - Debbra Riding, MD

## 2018-02-05 DIAGNOSIS — H524 Presbyopia: Secondary | ICD-10-CM | POA: Diagnosis not present

## 2018-02-05 DIAGNOSIS — H5203 Hypermetropia, bilateral: Secondary | ICD-10-CM | POA: Diagnosis not present

## 2018-02-05 DIAGNOSIS — H52223 Regular astigmatism, bilateral: Secondary | ICD-10-CM | POA: Diagnosis not present

## 2018-02-05 DIAGNOSIS — H1859 Other hereditary corneal dystrophies: Secondary | ICD-10-CM | POA: Diagnosis not present

## 2018-02-12 ENCOUNTER — Telehealth: Payer: Self-pay | Admitting: *Deleted

## 2018-02-12 MED ORDER — FLUCONAZOLE 150 MG PO TABS: ORAL_TABLET | ORAL | 0 refills | Status: DC

## 2018-02-12 NOTE — Telephone Encounter (Signed)
Pt on augmentin and having symptoms of yeast infection. Diflucan 150mg  #2 one po 3 days apart per dr Brett Canales. [facility] [address]. Med sent to pharm. Pt notified.

## 2018-02-18 ENCOUNTER — Ambulatory Visit (INDEPENDENT_AMBULATORY_CARE_PROVIDER_SITE_OTHER): Payer: 59 | Admitting: Family Medicine

## 2018-02-18 VITALS — BP 105/70 | HR 69 | Temp 98.1°F | Ht 67.0 in | Wt 250.0 lb

## 2018-02-18 DIAGNOSIS — Z9189 Other specified personal risk factors, not elsewhere classified: Secondary | ICD-10-CM

## 2018-02-18 DIAGNOSIS — E559 Vitamin D deficiency, unspecified: Secondary | ICD-10-CM

## 2018-02-18 DIAGNOSIS — E8881 Metabolic syndrome: Secondary | ICD-10-CM | POA: Diagnosis not present

## 2018-02-18 DIAGNOSIS — Z6839 Body mass index (BMI) 39.0-39.9, adult: Secondary | ICD-10-CM

## 2018-02-19 LAB — COMPREHENSIVE METABOLIC PANEL
ALBUMIN: 4 g/dL (ref 3.5–5.5)
ALK PHOS: 59 IU/L (ref 39–117)
ALT: 17 IU/L (ref 0–32)
AST: 15 IU/L (ref 0–40)
Albumin/Globulin Ratio: 1.4 (ref 1.2–2.2)
BUN/Creatinine Ratio: 22 (ref 9–23)
BUN: 15 mg/dL (ref 6–24)
Bilirubin Total: 0.4 mg/dL (ref 0.0–1.2)
CO2: 22 mmol/L (ref 20–29)
Calcium: 9.2 mg/dL (ref 8.7–10.2)
Chloride: 102 mmol/L (ref 96–106)
Creatinine, Ser: 0.67 mg/dL (ref 0.57–1.00)
GFR calc Af Amer: 126 mL/min/{1.73_m2} (ref >59)
GFR calc non Af Amer: 110 mL/min/{1.73_m2} (ref >59)
GLUCOSE: 77 mg/dL (ref 65–99)
Globulin, Total: 2.8 g/dL (ref 1.5–4.5)
POTASSIUM: 4.1 mmol/L (ref 3.5–5.2)
SODIUM: 140 mmol/L (ref 134–144)
TOTAL PROTEIN: 6.8 g/dL (ref 6.0–8.5)

## 2018-02-19 LAB — HEMOGLOBIN A1C
ESTIMATED AVERAGE GLUCOSE: 103 mg/dL
HEMOGLOBIN A1C: 5.2 % (ref 4.8–5.6)

## 2018-02-19 LAB — LIPID PANEL WITH LDL/HDL RATIO
Cholesterol, Total: 150 mg/dL (ref 100–199)
HDL: 37 mg/dL — AB (ref >39)
LDL Calculated: 90 mg/dL (ref 0–99)
LDL/HDL RATIO: 2.4 ratio (ref 0.0–3.2)
Triglycerides: 113 mg/dL (ref 0–149)
VLDL Cholesterol Cal: 23 mg/dL (ref 5–40)

## 2018-02-19 LAB — VITAMIN D 25 HYDROXY (VIT D DEFICIENCY, FRACTURES): VIT D 25 HYDROXY: 30.7 ng/mL (ref 30.0–100.0)

## 2018-02-19 LAB — INSULIN, RANDOM: INSULIN: 16.7 u[IU]/mL (ref 2.6–24.9)

## 2018-02-19 NOTE — Progress Notes (Signed)
Office: [phone]  /  Fax: [phone]   HPI:   Chief Complaint: OBESITY Christina Rodriguez is here to discuss her progress with her obesity treatment plan. She is keeping  a food journal with 1500 calories and 90 grams of protein and is following her eating plan approximately 90 % of the time. She states she is walking 30 minutes 3 to 4 times per week. [name] is still enjoying journaling. She was on prednisone for TMJ and she did notice increased hunger. She recently took a trip to [place].  Her weight is 250 lb (113.4 kg) today and has had a weight loss of 2 pounds over a period of 4 weeks since her last visit. She has lost 10 lbs since starting treatment with [place].  Insulin Resistance [name] has a diagnosis of insulin resistance based on her elevated fasting insulin level >5. Although [name]'s blood glucose readings are still under good control, insulin resistance puts her at greater risk of metabolic syndrome and diabetes. She admits to occasional carb cravings. She is taking Victoza and metformin currently and is not having any side effects. She continues to work on diet and exercise to decrease risk of diabetes.   At risk for diabetes [name] is at higher than average risk for developing diabetes due to her insulin resistance and obesity.   Vitamin D deficiency [name] has a diagnosis of vitamin D deficiency. She is currently taking vit D. She admits fatigue and denies nausea, vomiting or muscle weakness.  ALLERGIES: Allergies  Allergen Reactions  . Beta Adrenergic Blockers Shortness Of Breath  . Shrimp [Shellfish Allergy] Hives  . Soybeans Hives    MEDICATIONS: Current Outpatient Medications on File Prior to Visit  Medication Sig Dispense Refill  . albuterol (PROVENTIL HFA;VENTOLIN HFA) 108 (90 BASE) MCG/ACT inhaler Inhale 2 puffs into the lungs every 4 (four) hours as needed for wheezing or shortness of breath. 1 Inhaler 5  . cyanocobalamin 500 MCG tablet Take 500 mcg by mouth daily.     Marland Kitchen diltiazem (CARDIZEM CD) 240 MG 24 hr capsule Take 1 capsule (240 mg total) by mouth daily. 90 capsule 1  . EPINEPHrine 0.3 mg/0.3 mL IJ SOAJ injection Inject 0.3 mg into the muscle once. Reported on 05/03/2015    . fluconazole (DIFLUCAN) 150 MG tablet Take one tablet 3 days apart 2 tablet 0  . fluconazole (DIFLUCAN) 150 MG tablet Take one tablet today and repeat in 3 days 2 tablet 0  . Fluticasone-Salmeterol (ADVAIR DISKUS) 250-50 MCG/DOSE AEPB Inhale 1 puff into the lungs 2 (two) times daily.      . hydrochlorothiazide (HYDRODIURIL) 25 MG tablet Take 1 tablet (25 mg total) by mouth daily. 90 tablet 3  . ibuprofen (ADVIL,MOTRIN) 200 MG tablet Take 400 mg by mouth every 6 (six) hours as needed for headache, mild pain or moderate pain.    . Insulin Pen Needle (PEN NEEDLES) 32G X 5 MM MISC Inject 1.8 mg into the skin daily. 100 each 3  . liraglutide (VICTOZA) 18 MG/3ML SOPN Take  1.8 mg subcu qd 9 pen 3  . metFORMIN (GLUCOPHAGE-XR) 500 MG 24 hr tablet Take 2 tablets (1,000 mg total) by mouth daily with breakfast. 180 tablet 3  . Multiple Vitamins-Minerals (MULTIVITAMIN ADULT PO) Take 1 tablet by mouth daily.    . pantoprazole (PROTONIX) 40 MG tablet Take 1 tablet (40 mg total) by mouth daily. 90 tablet 3  . rizatriptan (MAXALT-MLT) 10 MG disintegrating tablet Take 1 tablet (10 mg total) by mouth  as needed for migraine. May repeat in 2 hours if needed; max 2 per 24 hours 27 tablet 1  . terconazole (TERAZOL 7) 0.4 % vaginal cream Place 1 applicator vaginally at bedtime. 45 g 0  . Vitamin D, Ergocalciferol, (DRISDOL) 50000 units CAPS capsule Take 1 capsule (50,000 Units total) by mouth every 3 (three) days. 10 capsule 0   No current facility-administered medications on file prior to visit.     PAST MEDICAL HISTORY: Past Medical History:  Diagnosis Date  . Adenomyosis   . Anemia    HIstory of   . Asthma    albuterol inhaler prn-recent uri-using more freq  . Dysrhythmia    history of  svt-controlled on cardizem-diagnosed in 1999-  . Gall bladder disease   . GERD (gastroesophageal reflux disease)    uses protonix  . Headache    migraines   . History of polycystic ovarian disease   . Infertility associated with anovulation   . Metabolic syndrome   . Multiple food allergies    shrimp  . Osteoarthritis   . Palpitations   . PCOS (polycystic ovarian syndrome)   . PONV (postoperative nausea and vomiting)   . Prediabetes   . Recurrent upper respiratory infection (URI)    resolving  . SVT (supraventricular tachycardia) (HCC)    History of   . Vitamin D deficiency   . VSD (ventricular septal defect)    history of -no problems    PAST SURGICAL HISTORY: Past Surgical History:  Procedure Laterality Date  . CESAREAN SECTION  2010  . CESAREAN SECTION  11/16/2010   Procedure: CESAREAN SECTION;  Surgeon: Almon Hercules;  Location: [facility];  Service: Gynecology;  Laterality: N/A;  Repeat   . CHOLECYSTECTOMY  2003  . DILATION AND CURETTAGE OF UTERUS    . HYSTEROSCOPY W/D&C N/A 12/06/2014   Procedure: DILATATION AND CURETTAGE /HYSTEROSCOPY;  Surgeon: Waynard Reeds, MD;  Location: [facility];  Service: Gynecology;  Laterality: N/A;  . uterine polyps      SOCIAL HISTORY: Social History   Tobacco Use  . Smoking status: Never Smoker  . Smokeless tobacco: Never Used  Substance Use Topics  . Alcohol use: No  . Drug use: No    FAMILY HISTORY: Family History  Problem Relation Age of Onset  . Diabetes Father   . Hypertension Father   . Hyperlipidemia Father   . Sleep apnea Father   . Obesity Father     ROS: Review of Systems  Constitutional: Positive for malaise/fatigue and weight loss.  Gastrointestinal: Negative for nausea and vomiting.       Positive for carb cravings.  Musculoskeletal:       Negative for muscle weakness.    PHYSICAL EXAM: Blood pressure 105/70, pulse 69, temperature 98.1 F (36.7 C), temperature source Oral, height 5\' 7"  (1.702 m), weight 250 lb  (113.4 kg), SpO2 97 %. Body mass index is 39.16 kg/m. Physical Exam  Constitutional: She is oriented to person, place, and time. She appears well-developed and well-nourished.  Cardiovascular: Normal rate.  Pulmonary/Chest: Effort normal.  Musculoskeletal: Normal range of motion.  Neurological: She is oriented to person, place, and time.  Skin: Skin is warm and dry.  Psychiatric: She has a normal mood and affect. Her behavior is normal.  Vitals reviewed.   RECENT LABS AND TESTS: BMET    Component Value Date/Time   NA 140 02/18/2018 0853   K 4.1 02/18/2018 0853   CL 102 02/18/2018 0853   CO2  22 02/18/2018 0853   GLUCOSE 77 02/18/2018 0853   GLUCOSE 127 (H) 11/16/2014 1625   BUN 15 02/18/2018 0853   CREATININE 0.67 02/18/2018 0853   CREATININE 0.62 01/20/2014 1317   CALCIUM 9.2 02/18/2018 0853   GFRNONAA 110 02/18/2018 0853   GFRAA 126 02/18/2018 0853   Lab Results  Component Value Date   HGBA1C 5.2 02/18/2018   HGBA1C 5.4 09/10/2017   HGBA1C 5.5 04/23/2017   HGBA1C 5.3 12/26/2016   HGBA1C 5.5 02/22/2016   Lab Results  Component Value Date   INSULIN 16.7 02/18/2018   INSULIN 24.7 09/10/2017   INSULIN 24.6 04/23/2017   INSULIN 18.1 12/26/2016   CBC    Component Value Date/Time   WBC 6.7 12/26/2016 0957   WBC 8.9 11/16/2014 1625   RBC 4.64 12/26/2016 0957   RBC 4.34 11/16/2014 1625   HGB 12.9 12/26/2016 0957   HCT 39.7 12/26/2016 0957   PLT 389 (H) 01/26/2015 0823   MCV 86 12/26/2016 0957   MCH 27.8 12/26/2016 0957   MCH 27.0 11/16/2014 1625   MCHC 32.5 12/26/2016 0957   MCHC 31.8 11/16/2014 1625   RDW 15.2 12/26/2016 0957   LYMPHSABS 1.7 12/26/2016 0957   EOSABS 0.1 12/26/2016 0957   BASOSABS 0.0 12/26/2016 0957   Iron/TIBC/Ferritin/ %Sat    Component Value Date/Time   FERRITIN 21 02/22/2016 0823   Lipid Panel     Component Value Date/Time   CHOL 150 02/18/2018 0853   TRIG 113 02/18/2018 0853   HDL 37 (L) 02/18/2018 0853   CHOLHDL 3.6  02/22/2016 0823   CHOLHDL 4.6 01/20/2014 1317   VLDL 22 01/20/2014 1317   LDLCALC 90 02/18/2018 0853   Hepatic Function Panel     Component Value Date/Time   PROT 6.8 02/18/2018 0853   ALBUMIN 4.0 02/18/2018 0853   AST 15 02/18/2018 0853   ALT 17 02/18/2018 0853   ALKPHOS 59 02/18/2018 0853   BILITOT 0.4 02/18/2018 0853   BILIDIR 0.09 02/22/2016 0823   IBILI 0.2 01/20/2014 1317      Component Value Date/Time   TSH 2.220 12/26/2016 0957   TSH 3.130 02/22/2016 0823   TSH 2.880 01/26/2015 0823   Results for MERLY, HINKSON (MRN [id number]) as of 02/19/2018 15:38  Ref. Range 02/18/2018 08:53  Vitamin D, 25-Hydroxy Latest Ref Range: 30.0 - 100.0 ng/mL 30.7   ASSESSMENT AND PLAN: Insulin resistance - Plan: Comprehensive metabolic panel, Hemoglobin A1c, Insulin, random, Lipid Panel With LDL/HDL Ratio  Vitamin D deficiency - Plan: VITAMIN D 25 Hydroxy (Vit-D Deficiency, Fractures)  At risk for diabetes mellitus  Class 2 severe obesity with serious comorbidity and body mass index (BMI) of 39.0 to 39.9 in adult, unspecified obesity type (HCC)  PLAN:  Insulin Resistance Sharmaine will continue to work on weight loss, exercise, and decreasing simple carbohydrates in her diet to help decrease the risk of diabetes. She was informed that eating too many simple carbohydrates or too many calories at one sitting increases the likelihood of GI side effects. Blessin agreed to continue her current medications. We will draw a HGB A1c, Insulin, FLP, and CMP today. Elva agreed to follow up with [place] as directed to monitor her progress.  Diabetes risk counseling Shi was given extended (15 minutes) diabetes prevention counseling today. She is 42 y.o. female and has risk factors for diabetes including insulin resistance and obesity. We discussed intensive lifestyle modifications today with an emphasis on weight loss as well as increasing exercise and decreasing  simple carbohydrates in her  diet.  Vitamin D Deficiency Sangeeta was informed that low vitamin D levels contributes to fatigue and are associated with obesity, breast, and colon cancer. She agrees to continue to take prescription Vit D @50 ,000 IU every week and will follow up for routine testing of vitamin D, at least 2-3 times per year. She was informed of the risk of over-replacement of vitamin D and agrees to not increase her dose unless she discusses this with [place] first. We will draw a vitamin D level today. Embry agrees to follow up in 4 weeks.   Obesity Mackenzee is currently in the action stage of change. As such, her goal is to continue with weight loss. She has agreed to keep a food journal of 1500 calories and 90+ grams of protein. Nyala has been instructed to work up to a goal of 150 minutes of combined cardio and strengthening exercise per week for weight loss and overall health benefits. We discussed the following Behavioral Modification Strategies today: increasing lean protein, increasing vegetables, meal planning, and cooking strategies, better snacking choices, and planning for success.   Francella has agreed to follow up with our clinic in 4 weeks. She was informed of the importance of frequent follow up visits to maximize her success with intensive lifestyle modifications for her multiple health conditions.   OBESITY BEHAVIORAL INTERVENTION VISIT  Today's visit was # 19  Starting weight: 260 lbs Starting date: 12/26/16 Today's weight : Weight: 250 lb (113.4 kg)  Today's date: 02/18/2018 Total lbs lost to date: 10  ASK: We discussed the diagnosis of obesity with Meganne S Kaffenberger today and Ziza agreed to give [place] permission to discuss obesity behavioral modification therapy today.  ASSESS: Carrah has the diagnosis of obesity and her BMI today is 39.15. Atleigh is in the action stage of change.   ADVISE: Adaijah was educated on the multiple health risks of obesity as well as the benefit of weight loss to  improve her health. She was advised of the need for long term treatment and the importance of lifestyle modifications to improve her current health and to decrease her risk of future health problems.  AGREE: Multiple dietary modification options and treatment options were discussed and Yeimi agreed to follow the recommendations documented in the above note.  ARRANGE: Delcenia was educated on the importance of frequent visits to treat obesity as outlined per CMS and USPSTF guidelines and agreed to schedule her next follow up appointment today.  I, Kirke Corin, am acting as Energy manager for Filbert Schilder, MD  I have reviewed the above documentation for accuracy and completeness, and I agree with the above. - Debbra Riding, MD

## 2018-03-11 MED FILL — VICTOZA 18 MG/3 ML INJECT P: 18 | 90 days supply | Qty: 27 | Fill #1

## 2018-03-11 MED FILL — HYDROCHLOROTHIAZIDE 25 MG T: 25 | 90 days supply | Qty: 90 | Fill #1

## 2018-03-11 MED FILL — metFORMIN HCL ER 500 MG TB2: 500 | 90 days supply | Qty: 180 | Fill #1

## 2018-03-16 ENCOUNTER — Ambulatory Visit (INDEPENDENT_AMBULATORY_CARE_PROVIDER_SITE_OTHER): Payer: 59 | Admitting: Family Medicine

## 2018-03-16 ENCOUNTER — Encounter (INDEPENDENT_AMBULATORY_CARE_PROVIDER_SITE_OTHER): Payer: Self-pay

## 2018-03-16 DIAGNOSIS — Z9189 Other specified personal risk factors, not elsewhere classified: Secondary | ICD-10-CM | POA: Diagnosis not present

## 2018-03-16 DIAGNOSIS — Z1371 Encounter for nonprocreative screening for genetic disease carrier status: Secondary | ICD-10-CM | POA: Diagnosis not present

## 2018-03-18 ENCOUNTER — Other Ambulatory Visit: Payer: Self-pay | Admitting: Obstetrics and Gynecology

## 2018-03-18 DIAGNOSIS — Z9189 Other specified personal risk factors, not elsewhere classified: Secondary | ICD-10-CM

## 2018-04-09 ENCOUNTER — Encounter (INDEPENDENT_AMBULATORY_CARE_PROVIDER_SITE_OTHER): Payer: Self-pay | Admitting: Family Medicine

## 2018-04-09 ENCOUNTER — Ambulatory Visit (INDEPENDENT_AMBULATORY_CARE_PROVIDER_SITE_OTHER): Payer: 59 | Admitting: Family Medicine

## 2018-04-09 VITALS — BP 103/70 | HR 78 | Ht 67.0 in | Wt 245.0 lb

## 2018-04-09 DIAGNOSIS — Z6835 Body mass index (BMI) 35.0-35.9, adult: Secondary | ICD-10-CM | POA: Diagnosis not present

## 2018-04-09 DIAGNOSIS — E1169 Type 2 diabetes mellitus with other specified complication: Secondary | ICD-10-CM | POA: Diagnosis not present

## 2018-04-09 DIAGNOSIS — E66812 Obesity, class 2: Secondary | ICD-10-CM

## 2018-04-11 ENCOUNTER — Ambulatory Visit
Admission: RE | Admit: 2018-04-11 | Discharge: 2018-04-11 | Disposition: A | Discharge status: Home / Self Care | Payer: 59 | Source: Ambulatory Visit | Attending: Obstetrics and Gynecology | Admitting: Obstetrics and Gynecology

## 2018-04-11 DIAGNOSIS — Z9189 Other specified personal risk factors, not elsewhere classified: Secondary | ICD-10-CM

## 2018-04-11 DIAGNOSIS — N6489 Other specified disorders of breast: Secondary | ICD-10-CM | POA: Diagnosis not present

## 2018-04-11 MED ORDER — GADOBUTROL 1 MMOL/ML IV SOLN
10.0000 mL | Freq: Once | INTRAVENOUS | Status: AC | PRN
  Administered 2018-04-11: 10 mL via INTRAVENOUS

## 2018-04-13 NOTE — Progress Notes (Signed)
Office: [phone]  /  Fax: [phone]   HPI:   Chief Complaint: OBESITY Christina Rodriguez is here to discuss her progress with her obesity treatment plan. She is keeping a food journal with 1500 calories and 90 grams of protein and is following her eating plan approximately 90 % of the time. She states she is walking 60 minutes 3 to 4 times per week. Christina Rodriguez did well over Thanksgiving. She had a GI bug with nausea and vomiting, but has recovered from this. She feels that she will do well over Christmas and is not going to give in to too many temptations.  Her weight is 245 lb (111.1 kg) today and has had a weight loss of 5 pounds over a period of 7 weeks since her last visit. She has lost 15 lbs since starting treatment with us.  Diabetes II Elany has a diagnosis of diabetes type II. Jinx checks her blood sugar 2 times weekly and she states that her BGs range between 100  and 110. Her last A1c was 5.2 on 02/18/18 and was well controlled. She requests a continuous glucose meter. She is not on insulin and is well controlled. She denies any hyperglycemic or hypoglycemic episodes. She has been working on intensive lifestyle modifications including diet, exercise, and weight loss to help control her blood glucose levels.   ALLERGIES: Allergies  Allergen Reactions  . Beta Adrenergic Blockers Shortness Of Breath  . Shrimp [Shellfish Allergy] Hives  . Soybeans Hives    MEDICATIONS: Current Outpatient Medications on File Prior to Visit  Medication Sig Dispense Refill  . albuterol (PROVENTIL HFA;VENTOLIN HFA) 108 (90 BASE) MCG/ACT inhaler Inhale 2 puffs into the lungs every 4 (four) hours as needed for wheezing or shortness of breath. 1 Inhaler 5  . cyanocobalamin 500 MCG tablet Take 500 mcg by mouth daily.    Marland Kitchen. diltiazem (CARDIZEM CD) 240 MG 24 hr capsule Take 1 capsule (240 mg total) by mouth daily. 90 capsule 1  . EPINEPHrine 0.3 mg/0.3 mL IJ SOAJ injection Inject 0.3 mg into the muscle once.  Reported on 05/03/2015    . fluconazole (DIFLUCAN) 150 MG tablet Take one tablet 3 days apart 2 tablet 0  . fluconazole (DIFLUCAN) 150 MG tablet Take one tablet today and repeat in 3 days 2 tablet 0  . Fluticasone-Salmeterol (ADVAIR DISKUS) 250-50 MCG/DOSE AEPB Inhale 1 puff into the lungs 2 (two) times daily.      . hydrochlorothiazide (HYDRODIURIL) 25 MG tablet Take 1 tablet (25 mg total) by mouth daily. 90 tablet 3  . ibuprofen (ADVIL,MOTRIN) 200 MG tablet Take 400 mg by mouth every 6 (six) hours as needed for headache, mild pain or moderate pain.    . Insulin Pen Needle (PEN NEEDLES) 32G X 5 MM MISC Inject 1.8 mg into the skin daily. 100 each 3  . liraglutide (VICTOZA) 18 MG/3ML SOPN Take  1.8 mg subcu qd 9 pen 3  . metFORMIN (GLUCOPHAGE-XR) 500 MG 24 hr tablet Take 2 tablets (1,000 mg total) by mouth daily with breakfast. 180 tablet 3  . Multiple Vitamins-Minerals (MULTIVITAMIN ADULT PO) Take 1 tablet by mouth daily.    . pantoprazole (PROTONIX) 40 MG tablet Take 1 tablet (40 mg total) by mouth daily. 90 tablet 3  . rizatriptan (MAXALT-MLT) 10 MG disintegrating tablet Take 1 tablet (10 mg total) by mouth as needed for migraine. May repeat in 2 hours if needed; max 2 per 24 hours 27 tablet 1  . terconazole (TERAZOL 7) 0.4 %  vaginal cream Place 1 applicator vaginally at bedtime. 45 g 0  . Vitamin D, Ergocalciferol, (DRISDOL) 50000 units CAPS capsule Take 1 capsule (50,000 Units total) by mouth every 3 (three) days. 10 capsule 0   No current facility-administered medications on file prior to visit.     PAST MEDICAL HISTORY: Past Medical History:  Diagnosis Date  . Adenomyosis   . Anemia    HIstory of   . Asthma    albuterol inhaler prn-recent uri-using more freq  . Dysrhythmia    history of svt-controlled on cardizem-diagnosed in 1999-  . Gall bladder disease   . GERD (gastroesophageal reflux disease)    uses protonix  . Headache    migraines   . History of polycystic ovarian  disease   . Infertility associated with anovulation   . Metabolic syndrome   . Multiple food allergies    shrimp  . Osteoarthritis   . Palpitations   . PCOS (polycystic ovarian syndrome)   . PONV (postoperative nausea and vomiting)   . Prediabetes   . Recurrent upper respiratory infection (URI)    resolving  . SVT (supraventricular tachycardia) (HCC)    History of   . Vitamin D deficiency   . VSD (ventricular septal defect)    history of -no problems    PAST SURGICAL HISTORY: Past Surgical History:  Procedure Laterality Date  . CESAREAN SECTION  2010  . CESAREAN SECTION  11/16/2010   Procedure: CESAREAN SECTION;  Surgeon: Almon Hercules;  Location: [facility];  Service: Gynecology;  Laterality: N/A;  Repeat   . CHOLECYSTECTOMY  2003  . DILATION AND CURETTAGE OF UTERUS    . HYSTEROSCOPY W/D&C N/A 12/06/2014   Procedure: DILATATION AND CURETTAGE /HYSTEROSCOPY;  Surgeon: Waynard Reeds, MD;  Location: [facility];  Service: Gynecology;  Laterality: N/A;  . uterine polyps      SOCIAL HISTORY: Social History   Tobacco Use  . Smoking status: Never Smoker  . Smokeless tobacco: Never Used  Substance Use Topics  . Alcohol use: No  . Drug use: No    FAMILY HISTORY: Family History  Problem Relation Age of Onset  . Diabetes Father   . Hypertension Father   . Hyperlipidemia Father   . Sleep apnea Father   . Obesity Father     ROS: ROS  PHYSICAL EXAM: Blood pressure 103/70, pulse 78, height 5\' 7"  (1.702 m), weight 245 lb (111.1 kg), SpO2 99 %. Body mass index is 38.37 kg/m. Physical Exam  RECENT LABS AND TESTS: BMET    Component Value Date/Time   NA 140 02/18/2018 0853   K 4.1 02/18/2018 0853   CL 102 02/18/2018 0853   CO2 22 02/18/2018 0853   GLUCOSE 77 02/18/2018 0853   GLUCOSE 127 (H) 11/16/2014 1625   BUN 15 02/18/2018 0853   CREATININE 0.67 02/18/2018 0853   CREATININE 0.62 01/20/2014 1317   CALCIUM 9.2 02/18/2018 0853   GFRNONAA 110 02/18/2018 0853   GFRAA 126  02/18/2018 0853   Lab Results  Component Value Date   HGBA1C 5.2 02/18/2018   HGBA1C 5.4 09/10/2017   HGBA1C 5.5 04/23/2017   HGBA1C 5.3 12/26/2016   HGBA1C 5.5 02/22/2016   Lab Results  Component Value Date   INSULIN 16.7 02/18/2018   INSULIN 24.7 09/10/2017   INSULIN 24.6 04/23/2017   INSULIN 18.1 12/26/2016   CBC    Component Value Date/Time   WBC 6.7 12/26/2016 0957   WBC 8.9 11/16/2014 1625   RBC 4.64  12/26/2016 0957   RBC 4.34 11/16/2014 1625   HGB 12.9 12/26/2016 0957   HCT 39.7 12/26/2016 0957   PLT 389 (H) 01/26/2015 0823   MCV 86 12/26/2016 0957   MCH 27.8 12/26/2016 0957   MCH 27.0 11/16/2014 1625   MCHC 32.5 12/26/2016 0957   MCHC 31.8 11/16/2014 1625   RDW 15.2 12/26/2016 0957   LYMPHSABS 1.7 12/26/2016 0957   EOSABS 0.1 12/26/2016 0957   BASOSABS 0.0 12/26/2016 0957   Iron/TIBC/Ferritin/ %Sat    Component Value Date/Time   FERRITIN 21 02/22/2016 0823   Lipid Panel     Component Value Date/Time   CHOL 150 02/18/2018 0853   TRIG 113 02/18/2018 0853   HDL 37 (L) 02/18/2018 0853   CHOLHDL 3.6 02/22/2016 0823   CHOLHDL 4.6 01/20/2014 1317   VLDL 22 01/20/2014 1317   LDLCALC 90 02/18/2018 0853   Hepatic Function Panel     Component Value Date/Time   PROT 6.8 02/18/2018 0853   ALBUMIN 4.0 02/18/2018 0853   AST 15 02/18/2018 0853   ALT 17 02/18/2018 0853   ALKPHOS 59 02/18/2018 0853   BILITOT 0.4 02/18/2018 0853   BILIDIR 0.09 02/22/2016 0823   IBILI 0.2 01/20/2014 1317      Component Value Date/Time   TSH 2.220 12/26/2016 0957   TSH 3.130 02/22/2016 0823   TSH 2.880 01/26/2015 0823   Results for AJAHNAE, RATHGEBER (MRN [id number]) as of 04/13/2018 13:44  Ref. Range 02/18/2018 08:53  Vitamin D, 25-Hydroxy Latest Ref Range: 30.0 - 100.0 ng/mL 30.7   ASSESSMENT AND PLAN: Type 2 diabetes mellitus with other specified complication, without long-term current use of insulin (HCC)  Class 2 severe obesity with serious comorbidity and body mass  index (BMI) of 35.0 to 35.9 in adult, unspecified obesity type (HCC)  PLAN:  Diabetes II Micala has been given extensive diabetes education by myself today including ideal fasting and post-prandial blood glucose readings, individual ideal Hgb A1c goals, and hypoglycemia prevention. We discussed the importance of good blood sugar control to decrease the likelihood of diabetic complications such as nephropathy, neuropathy, limb loss, blindness, coronary artery disease, and death. We discussed the importance of intensive lifestyle modification including diet, exercise and weight loss as the first line treatment for diabetes. She was advised that she doesn't need to check her glucose often enough to justify a continuous glucometer and to continue to check about once a week or if she feels symptomatic. Lugene agrees to continue her diabetes medications and will follow up at the agreed upon time in 4 weeks.  I spent > than 50% of the 25 minute visit on counseling as documented in the note.   Obesity Aveline is currently in the action stage of change. As such, her goal is to continue with weight loss efforts. She has agreed to keep a food journal with 1500 calories and 90 grams of protein.  Vina has been instructed to work up to a goal of 150 minutes of combined cardio and strengthening exercise per week for weight loss and overall health benefits. We discussed the following Behavioral Modification Strategies today: increasing lean protein intake, decreasing simple carbohydrates, work on meal planning and easy cooking plans, and holiday eating strategies.   Nioma has agreed to follow up with our clinic in 4 weeks. She was informed of the importance of frequent follow up visits to maximize her success with intensive lifestyle modifications for her multiple health conditions.   OBESITY BEHAVIORAL INTERVENTION VISIT  Today's visit was # 20   Starting weight: 260 lbs Starting date: 12/26/16 Today's  weight : Weight: 245 lb (111.1 kg)  Today's date: 04/09/2018 Total lbs lost to date: 15  ASK: We discussed the diagnosis of obesity with Lavilla S Leamy today and Kyrra agreed to give [place] permission to discuss obesity behavioral modification therapy today.  ASSESS: Haylie has the diagnosis of obesity and her BMI today is 38.37. Camesha is in the action stage of change.   ADVISE: Airiana was educated on the multiple health risks of obesity as well as the benefit of weight loss to improve her health. She was advised of the need for long term treatment and the importance of lifestyle modifications to improve her current health and to decrease her risk of future health problems.  AGREE: Multiple dietary modification options and treatment options were discussed and Hanalei agreed to follow the recommendations documented in the above note.  ARRANGE: Marceil was educated on the importance of frequent visits to treat obesity as outlined per CMS and USPSTF guidelines and agreed to schedule her next follow up appointment today.  I, Kirke Corin, am acting as transcriptionist for Wilder Glade, MD  I have reviewed the above documentation for accuracy and completeness, and I agree with the above. -Quillian Quince, MD

## 2018-05-12 ENCOUNTER — Ambulatory Visit (INDEPENDENT_AMBULATORY_CARE_PROVIDER_SITE_OTHER): Payer: 59 | Admitting: Family Medicine

## 2018-05-12 ENCOUNTER — Encounter (INDEPENDENT_AMBULATORY_CARE_PROVIDER_SITE_OTHER): Payer: Self-pay | Admitting: Family Medicine

## 2018-05-12 VITALS — BP 112/78 | HR 90 | Temp 98.0°F | Ht 67.0 in | Wt 248.0 lb

## 2018-05-12 DIAGNOSIS — R7303 Prediabetes: Secondary | ICD-10-CM | POA: Diagnosis not present

## 2018-05-12 DIAGNOSIS — Z6838 Body mass index (BMI) 38.0-38.9, adult: Secondary | ICD-10-CM | POA: Diagnosis not present

## 2018-05-13 NOTE — Progress Notes (Signed)
Office: [phone]  /  Fax: [phone]   HPI:   Chief Complaint: OBESITY Hayven is here to discuss her progress with her obesity treatment plan. She is keeping a food journal with 1500 calories and 90+ grams of protein  and is following her eating plan approximately 75 % of the time. She states she is walking 30 minutes 3 to 4 times per week. Destynee indulged somewhat over Christmas and New Years, but the celebration food is gone. She has gotten back on track yesterday with keeping her food journal.  Her weight is 248 lb (112.5 kg) today and has had a weight gain of 3 pounds over a period of 5 weeks since her last visit. She has lost 12 lbs since starting treatment with [place].  Pre-Diabetes Hema has a diagnosis of pre-diabetes based on her elevated Hgb A1c and was informed this puts her at greater risk of developing diabetes. She is stable on Victoza currently. She has been working on her diet and weight loss, but has gotten off track over the holidays. She denies nausea, vomiting, or hypoglycemia.  ASSESSMENT AND PLAN:  Prediabetes  Class 2 severe obesity with serious comorbidity and body mass index (BMI) of 38.0 to 38.9 in adult, unspecified obesity type [facility])  PLAN:  Pre-Diabetes Beverlie will continue to work on weight loss, exercise, and decreasing simple carbohydrates in her diet to help decrease the risk of diabetes. She was informed that eating too many simple carbohydrates or too many calories at one sitting increases the likelihood of GI side effects. Keyon agreed to continue Victoza and to get back to her diet, exercise, and weight loss. Amybeth agreed to follow up with [place] as directed to monitor her progress in 2 weeks.  I spent > than 50% of the 25 minute visit on counseling as documented in the note.  Obesity Ivyanna is currently in the action stage of change. As such, her goal is to continue with weight loss efforts. She has agreed to keep a food journal with 1500  calories and 90+ grams of protein.  Eran has been instructed to work up to a goal of 150 minutes of combined cardio and strengthening exercise per week for weight loss and overall health benefits. We discussed the following Behavioral Modification Strategies today: increasing lean protein intake, decreasing simple carbohydrates, and work on meal planning and easy cooking plans.  Sincere has agreed to follow up with our clinic in 2 weeks. She was informed of the importance of frequent follow up visits to maximize her success with intensive lifestyle modifications for her multiple health conditions.  ALLERGIES: Allergies  Allergen Reactions  . Beta Adrenergic Blockers Shortness Of Breath  . Shrimp [Shellfish Allergy] Hives  . Soybeans Hives    MEDICATIONS: Current Outpatient Medications on File Prior to Visit  Medication Sig Dispense Refill  . albuterol (PROVENTIL HFA;VENTOLIN HFA) 108 (90 BASE) MCG/ACT inhaler Inhale 2 puffs into the lungs every 4 (four) hours as needed for wheezing or shortness of breath. 1 Inhaler 5  . cyanocobalamin 500 MCG tablet Take 500 mcg by mouth daily.    Marland Kitchen diltiazem (CARDIZEM CD) 240 MG 24 hr capsule Take 1 capsule (240 mg total) by mouth daily. 90 capsule 1  . EPINEPHrine 0.3 mg/0.3 mL IJ SOAJ injection Inject 0.3 mg into the muscle once. Reported on 05/03/2015    . Fluticasone-Salmeterol (ADVAIR DISKUS) 250-50 MCG/DOSE AEPB Inhale 1 puff into the lungs 2 (two) times daily.      Marland Kitchen  hydrochlorothiazide (HYDRODIURIL) 25 MG tablet Take 1 tablet (25 mg total) by mouth daily. 90 tablet 3  . ibuprofen (ADVIL,MOTRIN) 200 MG tablet Take 400 mg by mouth every 6 (six) hours as needed for headache, mild pain or moderate pain.    . Insulin Pen Needle (PEN NEEDLES) 32G X 5 MM MISC Inject 1.8 mg into the skin daily. 100 each 3  . liraglutide (VICTOZA) 18 MG/3ML SOPN Take  1.8 mg subcu qd 9 pen 3  . metFORMIN (GLUCOPHAGE-XR) 500 MG 24 hr tablet Take 2 tablets (1,000 mg total)  by mouth daily with breakfast. 180 tablet 3  . Multiple Vitamins-Minerals (MULTIVITAMIN ADULT PO) Take 1 tablet by mouth daily.    . pantoprazole (PROTONIX) 40 MG tablet Take 1 tablet (40 mg total) by mouth daily. 90 tablet 3  . rizatriptan (MAXALT-MLT) 10 MG disintegrating tablet Take 1 tablet (10 mg total) by mouth as needed for migraine. May repeat in 2 hours if needed; max 2 per 24 hours 27 tablet 1  . Vitamin D, Ergocalciferol, (DRISDOL) 50000 units CAPS capsule Take 1 capsule (50,000 Units total) by mouth every 3 (three) days. 10 capsule 0   No current facility-administered medications on file prior to visit.     PAST MEDICAL HISTORY: Past Medical History:  Diagnosis Date  . Adenomyosis   . Anemia    HIstory of   . Asthma    albuterol inhaler prn-recent uri-using more freq  . Dysrhythmia    history of svt-controlled on cardizem-diagnosed in 1999-  . Gall bladder disease   . GERD (gastroesophageal reflux disease)    uses protonix  . Headache    migraines   . History of polycystic ovarian disease   . Infertility associated with anovulation   . Metabolic syndrome   . Multiple food allergies    shrimp  . Osteoarthritis   . Palpitations   . PCOS (polycystic ovarian syndrome)   . PONV (postoperative nausea and vomiting)   . Prediabetes   . Recurrent upper respiratory infection (URI)    resolving  . SVT (supraventricular tachycardia) (HCC)    History of   . Vitamin D deficiency   . VSD (ventricular septal defect)    history of -no problems    PAST SURGICAL HISTORY: Past Surgical History:  Procedure Laterality Date  . CESAREAN SECTION  2010  . CESAREAN SECTION  11/16/2010   Procedure: CESAREAN SECTION;  Surgeon: Almon HerculesKendra H. Ross;  Location: [facility];  Service: Gynecology;  Laterality: N/A;  Repeat   . CHOLECYSTECTOMY  2003  . DILATION AND CURETTAGE OF UTERUS    . HYSTEROSCOPY W/D&C N/A 12/06/2014   Procedure: DILATATION AND CURETTAGE /HYSTEROSCOPY;  Surgeon: [name],  MD;  Location: [facility];  Service: Gynecology;  Laterality: N/A;  . uterine polyps      SOCIAL HISTORY: Social History   Tobacco Use  . Smoking status: Never Smoker  . Smokeless tobacco: Never Used  Substance Use Topics  . Alcohol use: No  . Drug use: No    FAMILY HISTORY: Family History  Problem Relation Age of Onset  . Diabetes Father   . Hypertension Father   . Hyperlipidemia Father   . Sleep apnea Father   . Obesity Father     ROS: Review of Systems  Constitutional: Negative for weight loss.  Gastrointestinal: Negative for nausea and vomiting.  Endo/Heme/Allergies:       Negative for hypoglycemia.    PHYSICAL EXAM: Blood pressure 112/78, pulse 90, temperature  98 F (36.7 C), temperature source Oral, height 5\' 7"  (1.702 m), weight 248 lb (112.5 kg), SpO2 98 %. Body mass index is 38.84 kg/m. Physical Exam Vitals signs reviewed.  Constitutional:      Appearance: Normal appearance. She is obese.  Cardiovascular:     Rate and Rhythm: Normal rate.  Pulmonary:     Effort: Pulmonary effort is normal.  Musculoskeletal: Normal range of motion.  Skin:    General: Skin is warm and dry.  Neurological:     Mental Status: She is alert and oriented to person, place, and time.  Psychiatric:        Mood and Affect: Mood normal.        Behavior: Behavior normal.     RECENT LABS AND TESTS: BMET    Component Value Date/Time   NA 140 02/18/2018 0853   K 4.1 02/18/2018 0853   CL 102 02/18/2018 0853   CO2 22 02/18/2018 0853   GLUCOSE 77 02/18/2018 0853   GLUCOSE 127 (H) 11/16/2014 1625   BUN 15 02/18/2018 0853   CREATININE 0.67 02/18/2018 0853   CREATININE 0.62 01/20/2014 1317   CALCIUM 9.2 02/18/2018 0853   GFRNONAA 110 02/18/2018 0853   GFRAA 126 02/18/2018 0853   Lab Results  Component Value Date   HGBA1C 5.2 02/18/2018   HGBA1C 5.4 09/10/2017   HGBA1C 5.5 04/23/2017   HGBA1C 5.3 12/26/2016   HGBA1C 5.5 02/22/2016   Lab Results  Component Value Date    INSULIN 16.7 02/18/2018   INSULIN 24.7 09/10/2017   INSULIN 24.6 04/23/2017   INSULIN 18.1 12/26/2016   CBC    Component Value Date/Time   WBC 6.7 12/26/2016 0957   WBC 8.9 11/16/2014 1625   RBC 4.64 12/26/2016 0957   RBC 4.34 11/16/2014 1625   HGB 12.9 12/26/2016 0957   HCT 39.7 12/26/2016 0957   PLT 389 (H) 01/26/2015 0823   MCV 86 12/26/2016 0957   MCH 27.8 12/26/2016 0957   MCH 27.0 11/16/2014 1625   MCHC 32.5 12/26/2016 0957   MCHC 31.8 11/16/2014 1625   RDW 15.2 12/26/2016 0957   LYMPHSABS 1.7 12/26/2016 0957   EOSABS 0.1 12/26/2016 0957   BASOSABS 0.0 12/26/2016 0957   Iron/TIBC/Ferritin/ %Sat    Component Value Date/Time   FERRITIN 21 02/22/2016 0823   Lipid Panel     Component Value Date/Time   CHOL 150 02/18/2018 0853   TRIG 113 02/18/2018 0853   HDL 37 (L) 02/18/2018 0853   CHOLHDL 3.6 02/22/2016 0823   CHOLHDL 4.6 01/20/2014 1317   VLDL 22 01/20/2014 1317   LDLCALC 90 02/18/2018 0853   Hepatic Function Panel     Component Value Date/Time   PROT 6.8 02/18/2018 0853   ALBUMIN 4.0 02/18/2018 0853   AST 15 02/18/2018 0853   ALT 17 02/18/2018 0853   ALKPHOS 59 02/18/2018 0853   BILITOT 0.4 02/18/2018 0853   BILIDIR 0.09 02/22/2016 0823   IBILI 0.2 01/20/2014 1317      Component Value Date/Time   TSH 2.220 12/26/2016 0957   TSH 3.130 02/22/2016 0823   TSH 2.880 01/26/2015 0823   Results for HADLIE, GIPSON (MRN [id number]) as of 05/13/2018 11:39  Ref. Range 02/18/2018 08:53  Vitamin D, 25-Hydroxy Latest Ref Range: 30.0 - 100.0 ng/mL 30.7    OBESITY BEHAVIORAL INTERVENTION VISIT  Today's visit was # 21   Starting weight: 260 lbs Starting date: 12/26/16 Today's weight : Weight: 248 lb (112.5 kg)  Today's date:  05/12/2018 Total lbs lost to date: 12  ASK: We discussed the diagnosis of obesity with Calais S Chipman today and Safa agreed to give us permission to discuss obesity behavioral modification therapy today.  ASSESS: Marua has the  diagnosis of obesity and her BMI today is 38.8. Annalysia is in the action stage of change.   ADVISE: Donalyn was educated on the multiple health risks of obesity as well as the benefit of weight loss to improve her health. She was advised of the need for long term treatment and the importance of lifestyle modifications to improve her current health and to decrease her risk of future health problems.  AGREE: Multiple dietary modification options and treatment options were discussed and Kieren agreed to follow the recommendations documented in the above note.  ARRANGE: Odetta was educated on the importance of frequent visits to treat obesity as outlined per CMS and USPSTF guidelines and agreed to schedule her next follow up appointment today.  I, Kirke Corinara Soares, am acting as transcriptionist for Wilder Gladearen D. , MD  I have reviewed the above documentation for accuracy and completeness, and I agree with the above. -Quillian Quincearen , MD

## 2018-05-19 ENCOUNTER — Other Ambulatory Visit: Payer: Self-pay | Admitting: Family Medicine

## 2018-05-19 MED ORDER — FLUCONAZOLE 150 MG PO TABS: ORAL_TABLET | ORAL | 0 refills | Status: DC

## 2018-05-28 ENCOUNTER — Encounter (INDEPENDENT_AMBULATORY_CARE_PROVIDER_SITE_OTHER): Payer: Self-pay | Admitting: Family Medicine

## 2018-05-28 ENCOUNTER — Ambulatory Visit (INDEPENDENT_AMBULATORY_CARE_PROVIDER_SITE_OTHER): Payer: 59 | Admitting: Family Medicine

## 2018-05-28 VITALS — BP 108/73 | HR 80 | Temp 97.9°F | Ht 67.0 in | Wt 246.0 lb

## 2018-05-28 DIAGNOSIS — E8881 Metabolic syndrome: Secondary | ICD-10-CM

## 2018-05-28 DIAGNOSIS — Z6838 Body mass index (BMI) 38.0-38.9, adult: Secondary | ICD-10-CM | POA: Diagnosis not present

## 2018-05-28 NOTE — Progress Notes (Signed)
Office: [phone]  /  Fax: [phone]   HPI:   Chief Complaint: OBESITY Christina Rodriguez is here to discuss her progress with her obesity treatment plan. She is on the keep a food journal with 1500 calories and 90+ grams of protein daily and is following her eating plan approximately 90 % of the time. She states she is walking for 30-60 minutes 4-5 times per week. Christina Rodriguez continues to do well with weight loss and journaling. She is using protein drinks to meet her protein goals, but otherwise is making good food choices.  Her weight is 246 lb (111.6 kg) today and has had a weight loss of 2 pounds over a period of 2 weeks since her last visit. She has lost 14 lbs since starting treatment with us.  Insulin Resistance Christina Rodriguez has a diagnosis of insulin resistance based on her elevated fasting insulin level >5. Although [name]'s blood glucose readings are still under good control, insulin resistance puts her at greater risk of metabolic syndrome and diabetes. She is stable on Victoza and metformin and denies nausea, vomiting, or hypoglycemia, and she notes decreased polyphagia. She is doing well decreasing simple carbohydrates in diet.  ASSESSMENT AND PLAN:  Insulin resistance  Class 2 severe obesity with serious comorbidity and body mass index (BMI) of 38.0 to 38.9 in adult, unspecified obesity type (HCC)  PLAN:  Insulin Resistance Christina Rodriguez will continue to work on weight loss, diet, exercise, and decreasing simple carbohydrates in her diet to help decrease the risk of diabetes. We dicussed metformin including benefits and risks. She was informed that eating too many simple carbohydrates or too many calories at one sitting increases the likelihood of GI side effects. Christina Rodriguez agrees to continue taking her medications and we will recheck labs in 1 month. Christina Rodriguez agrees to follow up with our clinic in 3 to 4 weeks as directed to monitor her progress.  I spent > than 50% of the 25 minute visit on  counseling as documented in the note.  Obesity Christina Rodriguez is currently in the action stage of change. As such, her goal is to continue with weight loss efforts She has agreed to keep a food journal with 1500 calories and 90+ grams of protein daily Christina Rodriguez has been instructed to work up to a goal of 150 minutes of combined cardio and strengthening exercise per week for weight loss and overall health benefits. We discussed the following Behavioral Modification Strategies today: increasing lean protein intake, decreasing simple carbohydrates  and work on meal planning and easy cooking plans   Christina Rodriguez has agreed to follow up with our clinic in 3 to 4 weeks. She was informed of the importance of frequent follow up visits to maximize her success with intensive lifestyle modifications for her multiple health conditions.  ALLERGIES: Allergies  Allergen Reactions  . Beta Adrenergic Blockers Shortness Of Breath  . Shrimp [Shellfish Allergy] Hives  . Soybeans Hives    MEDICATIONS: Current Outpatient Medications on File Prior to Visit  Medication Sig Dispense Refill  . albuterol (PROVENTIL HFA;VENTOLIN HFA) 108 (90 BASE) MCG/ACT inhaler Inhale 2 puffs into the lungs every 4 (four) hours as needed for wheezing or shortness of breath. 1 Inhaler 5  . cyanocobalamin 500 MCG tablet Take 500 mcg by mouth daily.    Marland Kitchen. diltiazem (CARDIZEM CD) 240 MG 24 hr capsule Take 1 capsule (240 mg total) by mouth daily. 90 capsule 1  . EPINEPHrine 0.3 mg/0.3 mL IJ SOAJ injection Inject 0.3 mg into the muscle once.  Reported on 05/03/2015    . fluconazole (DIFLUCAN) 150 MG tablet Take one tablet by mouth three days apart 2 tablet 0  . Fluticasone-Salmeterol (ADVAIR DISKUS) 250-50 MCG/DOSE AEPB Inhale 1 puff into the lungs 2 (two) times daily.      . hydrochlorothiazide (HYDRODIURIL) 25 MG tablet Take 1 tablet (25 mg total) by mouth daily. 90 tablet 3  . ibuprofen (ADVIL,MOTRIN) 200 MG tablet Take 400 mg by mouth every 6 (six)  hours as needed for headache, mild pain or moderate pain.    . Insulin Pen Needle (PEN NEEDLES) 32G X 5 MM MISC Inject 1.8 mg into the skin daily. 100 each 3  . liraglutide (VICTOZA) 18 MG/3ML SOPN Take  1.8 mg subcu qd 9 pen 3  . metFORMIN (GLUCOPHAGE-XR) 500 MG 24 hr tablet Take 2 tablets (1,000 mg total) by mouth daily with breakfast. 180 tablet 3  . Multiple Vitamins-Minerals (MULTIVITAMIN ADULT PO) Take 1 tablet by mouth daily.    . pantoprazole (PROTONIX) 40 MG tablet Take 1 tablet (40 mg total) by mouth daily. 90 tablet 3  . rizatriptan (MAXALT-MLT) 10 MG disintegrating tablet Take 1 tablet (10 mg total) by mouth as needed for migraine. May repeat in 2 hours if needed; max 2 per 24 hours 27 tablet 1  . Vitamin D, Ergocalciferol, (DRISDOL) 50000 units CAPS capsule Take 1 capsule (50,000 Units total) by mouth every 3 (three) days. 10 capsule 0   No current facility-administered medications on file prior to visit.     PAST MEDICAL HISTORY: Past Medical History:  Diagnosis Date  . Adenomyosis   . Anemia    HIstory of   . Asthma    albuterol inhaler prn-recent uri-using more freq  . Dysrhythmia    history of svt-controlled on cardizem-diagnosed in 1999-  . Gall bladder disease   . GERD (gastroesophageal reflux disease)    uses protonix  . Headache    migraines   . History of polycystic ovarian disease   . Infertility associated with anovulation   . Metabolic syndrome   . Multiple food allergies    shrimp  . Osteoarthritis   . Palpitations   . PCOS (polycystic ovarian syndrome)   . PONV (postoperative nausea and vomiting)   . Prediabetes   . Recurrent upper respiratory infection (URI)    resolving  . SVT (supraventricular tachycardia) (HCC)    History of   . Vitamin D deficiency   . VSD (ventricular septal defect)    history of -no problems    PAST SURGICAL HISTORY: Past Surgical History:  Procedure Laterality Date  . CESAREAN SECTION  2010  . CESAREAN SECTION   11/16/2010   Procedure: CESAREAN SECTION;  Surgeon: Almon Hercules;  Location: [facility];  Service: Gynecology;  Laterality: N/A;  Repeat   . CHOLECYSTECTOMY  2003  . DILATION AND CURETTAGE OF UTERUS    . HYSTEROSCOPY W/D&C N/A 12/06/2014   Procedure: DILATATION AND CURETTAGE /HYSTEROSCOPY;  Surgeon: Waynard Reeds, MD;  Location: [facility];  Service: Gynecology;  Laterality: N/A;  . uterine polyps      SOCIAL HISTORY: Social History   Tobacco Use  . Smoking status: Never Smoker  . Smokeless tobacco: Never Used  Substance Use Topics  . Alcohol use: No  . Drug use: No    FAMILY HISTORY: Family History  Problem Relation Age of Onset  . Diabetes Father   . Hypertension Father   . Hyperlipidemia Father   . Sleep apnea Father   .  Obesity Father     ROS: Review of Systems  Constitutional: Positive for weight loss.  Gastrointestinal: Negative for nausea and vomiting.  Endo/Heme/Allergies:       Negative hypoglycemia Positive polyphagia    PHYSICAL EXAM: Blood pressure 108/73, pulse 80, temperature 97.9 F (36.6 C), temperature source Oral, height 5\' 7"  (1.702 m), weight 246 lb (111.6 kg), SpO2 100 %. Body mass index is 38.53 kg/m. Physical Exam Vitals signs reviewed.  Constitutional:      Appearance: Normal appearance. She is obese.  Cardiovascular:     Rate and Rhythm: Normal rate.     Pulses: Normal pulses.  Pulmonary:     Effort: Pulmonary effort is normal.     Breath sounds: Normal breath sounds.  Musculoskeletal: Normal range of motion.  Skin:    General: Skin is warm and dry.  Neurological:     Mental Status: She is alert and oriented to person, place, and time.  Psychiatric:        Mood and Affect: Mood normal.        Behavior: Behavior normal.     RECENT LABS AND TESTS: BMET    Component Value Date/Time   NA 140 02/18/2018 0853   K 4.1 02/18/2018 0853   CL 102 02/18/2018 0853   CO2 22 02/18/2018 0853   GLUCOSE 77 02/18/2018 0853   GLUCOSE 127 (H)  11/16/2014 1625   BUN 15 02/18/2018 0853   CREATININE 0.67 02/18/2018 0853   CREATININE 0.62 01/20/2014 1317   CALCIUM 9.2 02/18/2018 0853   GFRNONAA 110 02/18/2018 0853   GFRAA 126 02/18/2018 0853   Lab Results  Component Value Date   HGBA1C 5.2 02/18/2018   HGBA1C 5.4 09/10/2017   HGBA1C 5.5 04/23/2017   HGBA1C 5.3 12/26/2016   HGBA1C 5.5 02/22/2016   Lab Results  Component Value Date   INSULIN 16.7 02/18/2018   INSULIN 24.7 09/10/2017   INSULIN 24.6 04/23/2017   INSULIN 18.1 12/26/2016   CBC    Component Value Date/Time   WBC 6.7 12/26/2016 0957   WBC 8.9 11/16/2014 1625   RBC 4.64 12/26/2016 0957   RBC 4.34 11/16/2014 1625   HGB 12.9 12/26/2016 0957   HCT 39.7 12/26/2016 0957   PLT 389 (H) 01/26/2015 0823   MCV 86 12/26/2016 0957   MCH 27.8 12/26/2016 0957   MCH 27.0 11/16/2014 1625   MCHC 32.5 12/26/2016 0957   MCHC 31.8 11/16/2014 1625   RDW 15.2 12/26/2016 0957   LYMPHSABS 1.7 12/26/2016 0957   EOSABS 0.1 12/26/2016 0957   BASOSABS 0.0 12/26/2016 0957   Iron/TIBC/Ferritin/ %Sat    Component Value Date/Time   FERRITIN 21 02/22/2016 0823   Lipid Panel     Component Value Date/Time   CHOL 150 02/18/2018 0853   TRIG 113 02/18/2018 0853   HDL 37 (L) 02/18/2018 0853   CHOLHDL 3.6 02/22/2016 0823   CHOLHDL 4.6 01/20/2014 1317   VLDL 22 01/20/2014 1317   LDLCALC 90 02/18/2018 0853   Hepatic Function Panel     Component Value Date/Time   PROT 6.8 02/18/2018 0853   ALBUMIN 4.0 02/18/2018 0853   AST 15 02/18/2018 0853   ALT 17 02/18/2018 0853   ALKPHOS 59 02/18/2018 0853   BILITOT 0.4 02/18/2018 0853   BILIDIR 0.09 02/22/2016 0823   IBILI 0.2 01/20/2014 1317      Component Value Date/Time   TSH 2.220 12/26/2016 0957   TSH 3.130 02/22/2016 0823   TSH 2.880 01/26/2015 [id number]  OBESITY BEHAVIORAL INTERVENTION VISIT  Today's visit was # 22   Starting weight: 260 lbs Starting date: 12/26/16 Today's weight : 246 lbs  Today's date:  05/28/2018 Total lbs lost to date: 14    ASK: We discussed the diagnosis of obesity with Mariany S Buonomo today and Rella agreed to give [place] permission to discuss obesity behavioral modification therapy today.  ASSESS: Ventura has the diagnosis of obesity and her BMI today is 38.52 Mauriah is in the action stage of change   ADVISE: Christina Rodriguez was educated on the multiple health risks of obesity as well as the benefit of weight loss to improve her health. She was advised of the need for long term treatment and the importance of lifestyle modifications to improve her current health and to decrease her risk of future health problems.  AGREE: Multiple dietary modification options and treatment options were discussed and  Ayahna agreed to follow the recommendations documented in the above note.  ARRANGE: Christina Rodriguez was educated on the importance of frequent visits to treat obesity as outlined per CMS and USPSTF guidelines and agreed to schedule her next follow up appointment today.  I, Burt Knack, am acting as transcriptionist for Quillian Quince, MD  I have reviewed the above documentation for accuracy and completeness, and I agree with the above. -Quillian Quince, MD

## 2018-06-03 MED FILL — HYDROCHLOROTHIAZIDE 25 MG T: 25 | 90 days supply | Qty: 90 | Fill #2

## 2018-06-03 MED FILL — VICTOZA 18 MG/3 ML INJECT P: 18 | 90 days supply | Qty: 27 | Fill #2

## 2018-06-03 MED FILL — PANTOPRAZOLE SOD DR 40 MG T: 40 | 90 days supply | Qty: 90 | Fill #0

## 2018-06-03 MED FILL — metFORMIN HCL ER 500 MG TB2: 500 | 90 days supply | Qty: 180 | Fill #2

## 2018-06-18 ENCOUNTER — Ambulatory Visit (INDEPENDENT_AMBULATORY_CARE_PROVIDER_SITE_OTHER): Payer: 59 | Admitting: Family Medicine

## 2018-06-18 ENCOUNTER — Encounter (INDEPENDENT_AMBULATORY_CARE_PROVIDER_SITE_OTHER): Payer: Self-pay | Admitting: Family Medicine

## 2018-06-18 VITALS — BP 106/71 | HR 101 | Temp 98.9°F | Ht 67.0 in | Wt 244.0 lb

## 2018-06-18 DIAGNOSIS — R7303 Prediabetes: Secondary | ICD-10-CM

## 2018-06-18 DIAGNOSIS — Z6838 Body mass index (BMI) 38.0-38.9, adult: Secondary | ICD-10-CM

## 2018-06-18 DIAGNOSIS — E559 Vitamin D deficiency, unspecified: Secondary | ICD-10-CM | POA: Diagnosis not present

## 2018-06-18 NOTE — Progress Notes (Signed)
Office: [phone]  /  Fax: [phone]   HPI:   Chief Complaint: OBESITY Christina Rodriguez is here to discuss her progress with her obesity treatment plan. She is keeping a food journal with 1000 calories and 90 grams of protein daily and is following her eating plan approximately 90% of the time. She states she is walking 30 minutes 3-4 times per week. Christina Rodriguez is working hard at hitting protein goal daily. Over the past few weeks she has been hungrier especially at breakfast and lunch. She is planning to go out tonight for Valentine's Day. She has no concerns with indulgences.  Her weight is 244 lb (110.7 kg) today and has had a weight loss of 2 pounds over a period of 3 weeks since her last visit. She has lost 16 lbs since starting treatment with [place].  Vitamin D deficiency Christina Rodriguez has a diagnosis of Vitamin D deficiency. She is currently taking prescription Vit D and denies nausea, vomiting or muscle weakness but does report fatigue.  Pre-Diabetes Christina Rodriguez has a diagnosis of prediabetes based on her elevated Hgb A1c and was informed this puts her at greater risk of developing diabetes. She is taking metformin currently and continues to work on diet and exercise to decrease risk of diabetes. Christina Rodriguez reports occasional carb cravings. She denies GI upset with metformin or Victoza.  ASSESSMENT AND PLAN:  Vitamin D deficiency  Prediabetes  Class 2 severe obesity with serious comorbidity and body mass index (BMI) of 38.0 to 38.9 in adult, unspecified obesity type (HCC)  PLAN:  Vitamin D Deficiency Christina Rodriguez was informed that low Vitamin D levels contributes to fatigue and are associated with obesity, breast, and colon cancer. She agrees to continue to continue prescription Vit D @ 50,000 IU every week and will follow-up for routine testing of Vitamin D, at least 2-3 times per year. She was informed of the risk of over-replacement of Vitamin D and agrees to not increase her dose unless she discusses this  with [place] first. Christina Rodriguez agrees to follow-up with our clinic in 2 weeks.  Pre-Diabetes Christina Rodriguez will continue to work on weight loss, exercise, and decreasing simple carbohydrates in her diet to help decrease the risk of diabetes. We dicussed metformin including benefits and risks. She was informed that eating too many simple carbohydrates or too many calories at one sitting increases the likelihood of GI side effects. Christina Rodriguez is on metformin and Victoza. Christina Rodriguez agreed to follow-up with [place] as directed to monitor her progress.  I spent > than 50% of the 15 minute visit on counseling as documented in the note.  Obesity Christina Rodriguez is currently in the action stage of change. As such, her goal is to continue with weight loss efforts. She has agreed to keep a food journal with 1600-1700 calories and 95+ grams of protein daily. Christina Rodriguez has been instructed to work up to a goal of 150 minutes of combined cardio and strengthening exercise per week for weight loss and overall health benefits. We discussed the following Behavioral Modification Strategies today: increasing lean protein intake, increasing vegetables, work on meal planning and easy cooking plans, planning for success, and keeping a strict food journal. Christina Rodriguez will add 15 minutes of resistance training 2 times per week to her exercise regimen.  Christina Rodriguez has agreed to follow-up with our clinic in 4 weeks. She was informed of the importance of frequent follow up visits to maximize her success with intensive lifestyle modifications for her multiple health conditions.  ALLERGIES: Allergies  Allergen Reactions  .  Beta Adrenergic Blockers Shortness Of Breath  . Shrimp [Shellfish Allergy] Hives  . Soybeans Hives    MEDICATIONS: Current Outpatient Medications on File Prior to Visit  Medication Sig Dispense Refill  . albuterol (PROVENTIL HFA;VENTOLIN HFA) 108 (90 BASE) MCG/ACT inhaler Inhale 2 puffs into the lungs every 4 (four) hours as needed for wheezing  or shortness of breath. 1 Inhaler 5  . cyanocobalamin 500 MCG tablet Take 500 mcg by mouth daily.    Marland Kitchen. diltiazem (CARDIZEM CD) 240 MG 24 hr capsule Take 1 capsule (240 mg total) by mouth daily. 90 capsule 1  . EPINEPHrine 0.3 mg/0.3 mL IJ SOAJ injection Inject 0.3 mg into the muscle once. Reported on 05/03/2015    . fluconazole (DIFLUCAN) 150 MG tablet Take one tablet by mouth three days apart 2 tablet 0  . Fluticasone-Salmeterol (ADVAIR DISKUS) 250-50 MCG/DOSE AEPB Inhale 1 puff into the lungs 2 (two) times daily.      . hydrochlorothiazide (HYDRODIURIL) 25 MG tablet Take 1 tablet (25 mg total) by mouth daily. 90 tablet 3  . ibuprofen (ADVIL,MOTRIN) 200 MG tablet Take 400 mg by mouth every 6 (six) hours as needed for headache, mild pain or moderate pain.    . Insulin Pen Needle (PEN NEEDLES) 32G X 5 MM MISC Inject 1.8 mg into the skin daily. 100 each 3  . liraglutide (VICTOZA) 18 MG/3ML SOPN Take  1.8 mg subcu qd 9 pen 3  . metFORMIN (GLUCOPHAGE-XR) 500 MG 24 hr tablet Take 2 tablets (1,000 mg total) by mouth daily with breakfast. 180 tablet 3  . Multiple Vitamins-Minerals (MULTIVITAMIN ADULT PO) Take 1 tablet by mouth daily.    . pantoprazole (PROTONIX) 40 MG tablet Take 1 tablet (40 mg total) by mouth daily. 90 tablet 3  . rizatriptan (MAXALT-MLT) 10 MG disintegrating tablet Take 1 tablet (10 mg total) by mouth as needed for migraine. May repeat in 2 hours if needed; max 2 per 24 hours 27 tablet 1  . Vitamin D, Ergocalciferol, (DRISDOL) 50000 units CAPS capsule Take 1 capsule (50,000 Units total) by mouth every 3 (three) days. 10 capsule 0   No current facility-administered medications on file prior to visit.     PAST MEDICAL HISTORY: Past Medical History:  Diagnosis Date  . Adenomyosis   . Anemia    HIstory of   . Asthma    albuterol inhaler prn-recent uri-using more freq  . Dysrhythmia    history of svt-controlled on cardizem-diagnosed in 1999-  . Gall bladder disease   . GERD  (gastroesophageal reflux disease)    uses protonix  . Headache    migraines   . History of polycystic ovarian disease   . Infertility associated with anovulation   . Metabolic syndrome   . Multiple food allergies    shrimp  . Osteoarthritis   . Palpitations   . PCOS (polycystic ovarian syndrome)   . PONV (postoperative nausea and vomiting)   . Prediabetes   . Recurrent upper respiratory infection (URI)    resolving  . SVT (supraventricular tachycardia) (HCC)    History of   . Vitamin D deficiency   . VSD (ventricular septal defect)    history of -no problems    PAST SURGICAL HISTORY: Past Surgical History:  Procedure Laterality Date  . CESAREAN SECTION  2010  . CESAREAN SECTION  11/16/2010   Procedure: CESAREAN SECTION;  Surgeon: Almon HerculesKendra H. Ross;  Location: [facility];  Service: Gynecology;  Laterality: N/A;  Repeat   .  CHOLECYSTECTOMY  2003  . DILATION AND CURETTAGE OF UTERUS    . HYSTEROSCOPY W/D&C N/A 12/06/2014   Procedure: DILATATION AND CURETTAGE /HYSTEROSCOPY;  Surgeon: Waynard Reeds, MD;  Location: [facility];  Service: Gynecology;  Laterality: N/A;  . uterine polyps      SOCIAL HISTORY: Social History   Tobacco Use  . Smoking status: Never Smoker  . Smokeless tobacco: Never Used  Substance Use Topics  . Alcohol use: No  . Drug use: No    FAMILY HISTORY: Family History  Problem Relation Age of Onset  . Diabetes Father   . Hypertension Father   . Hyperlipidemia Father   . Sleep apnea Father   . Obesity Father    ROS: Review of Systems  Constitutional: Positive for malaise/fatigue and weight loss.  Gastrointestinal: Negative for nausea and vomiting.  Musculoskeletal:       Negative for muscle weakness.  Endo/Heme/Allergies:       Negative for hypoglycemia.   PHYSICAL EXAM: Blood pressure 106/71, pulse (!) 101, temperature 98.9 F (37.2 C), temperature source Oral, height 5\' 7"  (1.702 m), weight 244 lb (110.7 kg), SpO2 98 %. Body mass index is 38.22  kg/m. Physical Exam Vitals signs reviewed.  Constitutional:      Appearance: Normal appearance. She is obese.  Cardiovascular:     Rate and Rhythm: Normal rate.     Pulses: Normal pulses.  Pulmonary:     Effort: Pulmonary effort is normal.     Breath sounds: Normal breath sounds.  Musculoskeletal: Normal range of motion.  Skin:    General: Skin is warm and dry.  Neurological:     Mental Status: She is alert and oriented to person, place, and time.  Psychiatric:        Behavior: Behavior normal.   RECENT LABS AND TESTS: BMET    Component Value Date/Time   NA 140 02/18/2018 0853   K 4.1 02/18/2018 0853   CL 102 02/18/2018 0853   CO2 22 02/18/2018 0853   GLUCOSE 77 02/18/2018 0853   GLUCOSE 127 (H) 11/16/2014 1625   BUN 15 02/18/2018 0853   CREATININE 0.67 02/18/2018 0853   CREATININE 0.62 01/20/2014 1317   CALCIUM 9.2 02/18/2018 0853   GFRNONAA 110 02/18/2018 0853   GFRAA 126 02/18/2018 0853   Lab Results  Component Value Date   HGBA1C 5.2 02/18/2018   HGBA1C 5.4 09/10/2017   HGBA1C 5.5 04/23/2017   HGBA1C 5.3 12/26/2016   HGBA1C 5.5 02/22/2016   Lab Results  Component Value Date   INSULIN 16.7 02/18/2018   INSULIN 24.7 09/10/2017   INSULIN 24.6 04/23/2017   INSULIN 18.1 12/26/2016   CBC    Component Value Date/Time   WBC 6.7 12/26/2016 0957   WBC 8.9 11/16/2014 1625   RBC 4.64 12/26/2016 0957   RBC 4.34 11/16/2014 1625   HGB 12.9 12/26/2016 0957   HCT 39.7 12/26/2016 0957   PLT 389 (H) 01/26/2015 0823   MCV 86 12/26/2016 0957   MCH 27.8 12/26/2016 0957   MCH 27.0 11/16/2014 1625   MCHC 32.5 12/26/2016 0957   MCHC 31.8 11/16/2014 1625   RDW 15.2 12/26/2016 0957   LYMPHSABS 1.7 12/26/2016 0957   EOSABS 0.1 12/26/2016 0957   BASOSABS 0.0 12/26/2016 0957   Iron/TIBC/Ferritin/ %Sat    Component Value Date/Time   FERRITIN 21 02/22/2016 0823   Lipid Panel     Component Value Date/Time   CHOL 150 02/18/2018 0853   TRIG 113 02/18/2018 0853  HDL  37 (L) 02/18/2018 0853   CHOLHDL 3.6 02/22/2016 0823   CHOLHDL 4.6 01/20/2014 1317   VLDL 22 01/20/2014 1317   LDLCALC 90 02/18/2018 0853   Hepatic Function Panel     Component Value Date/Time   PROT 6.8 02/18/2018 0853   ALBUMIN 4.0 02/18/2018 0853   AST 15 02/18/2018 0853   ALT 17 02/18/2018 0853   ALKPHOS 59 02/18/2018 0853   BILITOT 0.4 02/18/2018 0853   BILIDIR 0.09 02/22/2016 0823   IBILI 0.2 01/20/2014 1317      Component Value Date/Time   TSH 2.220 12/26/2016 0957   TSH 3.130 02/22/2016 0823   TSH 2.880 01/26/2015 0823   Results for BRYNNLEE, WITHEE (MRN [id number]) as of 06/18/2018 16:43  Ref. Range 02/18/2018 08:53  Vitamin D, 25-Hydroxy Latest Ref Range: 30.0 - 100.0 ng/mL 30.7   OBESITY BEHAVIORAL INTERVENTION VISIT  Today's visit was #23  Starting weight: 260 lbs Starting date: 12/26/2016 Today's weight: 244 lbs  Today's date: 06/18/2018 Total lbs lost to date: 3  ASK: We discussed the diagnosis of obesity with Christina Rodriguez today and Christina Rodriguez agreed to give [place] permission to discuss obesity behavioral modification therapy today.  ASSESS: Mykeisha has the diagnosis of obesity and her BMI today is 38.22. Maven is in the action stage of change.  ADVISE: Jamiyla was educated on the multiple health risks of obesity as well as the benefit of weight loss to improve her health. She was advised of the need for long term treatment and the importance of lifestyle modifications to improve her current health and to decrease her risk of future health problems.  AGREE: Multiple dietary modification options and treatment options were discussed and  Tasnia agreed to follow the recommendations documented in the above note.  ARRANGE: Drisana was educated on the importance of frequent visits to treat obesity as outlined per CMS and USPSTF guidelines and agreed to schedule her next follow up appointment today.  I, Marianna Payment, am acting as transcriptionist for Geryl Councilman, MD  I have reviewed the above documentation for accuracy and completeness, and I agree with the above. - Debbra Riding, MD

## 2018-07-06 ENCOUNTER — Telehealth: Payer: Self-pay | Admitting: Family Medicine

## 2018-07-06 ENCOUNTER — Other Ambulatory Visit: Payer: Self-pay | Admitting: Family Medicine

## 2018-07-06 MED ORDER — TIZANIDINE HCL 4 MG PO CAPS: ORAL_CAPSULE | ORAL | 0 refills | Status: DC

## 2018-07-06 NOTE — Telephone Encounter (Signed)
Pt pulled muscle in back Friday and has been having spasms since Friday evening. If able to call in something please send to [id number] - [place], [place] - [phone] AT [facility] [address]

## 2018-07-06 NOTE — Telephone Encounter (Signed)
Medication sent and pt is aware 

## 2018-07-06 NOTE — Telephone Encounter (Signed)
zanaflex 4 tid numb 30/  Be sure to add anti inflam if can take like aleave two bid with food

## 2018-07-14 ENCOUNTER — Encounter (INDEPENDENT_AMBULATORY_CARE_PROVIDER_SITE_OTHER): Payer: Self-pay | Admitting: Family Medicine

## 2018-07-14 ENCOUNTER — Ambulatory Visit (INDEPENDENT_AMBULATORY_CARE_PROVIDER_SITE_OTHER): Payer: 59 | Admitting: Family Medicine

## 2018-07-14 VITALS — BP 120/75 | HR 74 | Temp 97.7°F | Ht 67.0 in | Wt 246.0 lb

## 2018-07-14 DIAGNOSIS — E7849 Other hyperlipidemia: Secondary | ICD-10-CM

## 2018-07-14 DIAGNOSIS — Z6838 Body mass index (BMI) 38.0-38.9, adult: Secondary | ICD-10-CM

## 2018-07-14 DIAGNOSIS — Z9189 Other specified personal risk factors, not elsewhere classified: Secondary | ICD-10-CM

## 2018-07-14 DIAGNOSIS — E559 Vitamin D deficiency, unspecified: Secondary | ICD-10-CM

## 2018-07-14 DIAGNOSIS — E8881 Metabolic syndrome: Secondary | ICD-10-CM

## 2018-07-14 NOTE — Progress Notes (Signed)
Office: [phone]  /  Fax: [phone]   HPI:   Chief Complaint: OBESITY Christina Rodriguez is here to discuss her progress with her obesity treatment plan. She is on the keep a food journal with 1600-1700 calories and 95+ grams of protein daily and is following her eating plan approximately 95 % of the time. She states she is walking for 30 minutes 3 times per week. Christina Rodriguez pulled her back muscle a couple of weeks ago, so exercise has been limited. She re-injured her back yesterday. She felt she hit her protein goal 90% of the time and is getting all calories 90% of the time.  Her weight is 246 lb (111.6 kg) today and has gained 2 pounds since her last visit. She has lost 14 lbs since starting treatment with [place].  Insulin Resistance Christina Rodriguez has a diagnosis of insulin resistance based on her elevated fasting insulin level >5. Although Christina Rodriguez's blood glucose readings are still under good control, insulin resistance puts her at greater risk of metabolic syndrome and diabetes. She is taking metformin currently and notes minimal carbohydrate cravings. She continues to work on diet and exercise to decrease risk of diabetes.  At risk for diabetes Christina Rodriguez is at higher than average risk for developing diabetes due to her obesity and insulin resistance. She currently denies polyuria or polydipsia.  Vitamin D Deficiency Christina Rodriguez has a diagnosis of vitamin D deficiency. She is currently taking prescription Vit D. She notes fatigue and denies nausea, vomiting or muscle weakness.  Hyperlipidemia Christina Rodriguez has hyperlipidemia and has been trying to improve her cholesterol levels with intensive lifestyle modification including a low saturated fat diet, exercise and weight loss. Her LDL was previously elevated and HDL is low. She denies any chest pain, claudication or myalgias.  ASSESSMENT AND PLAN:  Insulin resistance - Plan: Hemoglobin A1c, Insulin, random  Vitamin D deficiency - Plan: VITAMIN D 25 Hydroxy (Vit-D  Deficiency, Fractures)  Other hyperlipidemia - Plan: Lipid Panel With LDL/HDL Ratio  At risk for diabetes mellitus  Class 2 severe obesity with serious comorbidity and body mass index (BMI) of 38.0 to 38.9 in adult, unspecified obesity type (HCC)  PLAN:  Insulin Resistance [name] will continue to work on weight loss, exercise, and decreasing simple carbohydrates in her diet to help decrease the risk of diabetes. We dicussed metformin including benefits and risks. She was informed that eating too many simple carbohydrates or too many calories at one sitting increases the likelihood of GI side effects. [name] agrees to continue metformin and we will check Hgb A1c, insulin level, and CMP today. [name] agrees to follow up with our clinic in 3 weeks as directed to monitor her progress.  Diabetes risk counseling [name] was given extended (15 minutes) diabetes prevention counseling today. She is 43 y.o. female and has risk factors for diabetes including obesity and insulin resistance. We discussed intensive lifestyle modifications today with an emphasis on weight loss as well as increasing exercise and decreasing simple carbohydrates in her diet.  Vitamin D Deficiency [name] was informed that low vitamin D levels contributes to fatigue and are associated with obesity, breast, and colon cancer. [name] agrees to continue taking prescription Vit D @50 ,000 IU every 3 days and will follow up for routine testing of vitamin D, at least 2-3 times per year. She was informed of the risk of over-replacement of vitamin D and agrees to not increase her dose unless she discusses this with [place] first. We will check Vit D level today. Ethyl agrees  to follow up with our clinic in 3 weeks.  Hyperlipidemia Christina Rodriguez was informed of the American Heart Association Guidelines emphasizing intensive lifestyle modifications as the first line treatment for hyperlipidemia. We discussed many lifestyle modifications today in depth, and  [name] will continue to work on decreasing saturated fats such as fatty red meat, butter and many fried foods. She will also increase vegetables and lean protein in her diet and continue to work on exercise and weight loss efforts. We will check FLP today. Christina Rodriguez agrees to follow up with our clinic in 3 weeks.  Obesity Christina Rodriguez is currently in the action stage of change. As such, her goal is to continue with weight loss efforts She has agreed to keep a food journal with 1600-1700 calories and 95+ grams of protein daily Christina Rodriguez has been instructed to work up to a goal of 150 minutes of combined cardio and strengthening exercise per week for weight loss and overall health benefits. We discussed the following Behavioral Modification Strategies today: increasing lean protein intake, increasing vegetables, work on meal planning and easy cooking plans, better snacking choices, planning for success, and keep a strict food journal   Christina Rodriguez has agreed to follow up with our clinic in 3 weeks. She was informed of the importance of frequent follow up visits to maximize her success with intensive lifestyle modifications for her multiple health conditions.  ALLERGIES: Allergies  Allergen Reactions  . Beta Adrenergic Blockers Shortness Of Breath  . Shrimp [Shellfish Allergy] Hives  . Soybeans Hives    MEDICATIONS: Current Outpatient Medications on File Prior to Visit  Medication Sig Dispense Refill  . albuterol (PROVENTIL HFA;VENTOLIN HFA) 108 (90 BASE) MCG/ACT inhaler Inhale 2 puffs into the lungs every 4 (four) hours as needed for wheezing or shortness of breath. 1 Inhaler 5  . cyanocobalamin 500 MCG tablet Take 500 mcg by mouth daily.    Marland Kitchen diltiazem (CARDIZEM CD) 240 MG 24 hr capsule Take 1 capsule (240 mg total) by mouth daily. 90 capsule 1  . EPINEPHrine 0.3 mg/0.3 mL IJ SOAJ injection Inject 0.3 mg into the muscle once. Reported on 05/03/2015    . fluconazole (DIFLUCAN) 150 MG tablet Take one tablet  by mouth three days apart 2 tablet 0  . Fluticasone-Salmeterol (ADVAIR DISKUS) 250-50 MCG/DOSE AEPB Inhale 1 puff into the lungs 2 (two) times daily.      . hydrochlorothiazide (HYDRODIURIL) 25 MG tablet Take 1 tablet (25 mg total) by mouth daily. 90 tablet 3  . ibuprofen (ADVIL,MOTRIN) 200 MG tablet Take 400 mg by mouth every 6 (six) hours as needed for headache, mild pain or moderate pain.    . Insulin Pen Needle (PEN NEEDLES) 32G X 5 MM MISC Inject 1.8 mg into the skin daily. 100 each 3  . liraglutide (VICTOZA) 18 MG/3ML SOPN Take  1.8 mg subcu qd 9 pen 3  . metFORMIN (GLUCOPHAGE-XR) 500 MG 24 hr tablet Take 2 tablets (1,000 mg total) by mouth daily with breakfast. 180 tablet 3  . Multiple Vitamins-Minerals (MULTIVITAMIN ADULT PO) Take 1 tablet by mouth daily.    . pantoprazole (PROTONIX) 40 MG tablet Take 1 tablet (40 mg total) by mouth daily. 90 tablet 3  . rizatriptan (MAXALT-MLT) 10 MG disintegrating tablet Take 1 tablet (10 mg total) by mouth as needed for migraine. May repeat in 2 hours if needed; max 2 per 24 hours 27 tablet 1  . tiZANidine (ZANAFLEX) 4 MG capsule Take one capsule by mouth three times daily 30 capsule  0  . Vitamin D, Ergocalciferol, (DRISDOL) 50000 units CAPS capsule Take 1 capsule (50,000 Units total) by mouth every 3 (three) days. 10 capsule 0   No current facility-administered medications on file prior to visit.     PAST MEDICAL HISTORY: Past Medical History:  Diagnosis Date  . Adenomyosis   . Anemia    HIstory of   . Asthma    albuterol inhaler prn-recent uri-using more freq  . Dysrhythmia    history of svt-controlled on cardizem-diagnosed in 1999-  . Gall bladder disease   . GERD (gastroesophageal reflux disease)    uses protonix  . Headache    migraines   . History of polycystic ovarian disease   . Infertility associated with anovulation   . Metabolic syndrome   . Multiple food allergies    shrimp  . Osteoarthritis   . Palpitations   . PCOS  (polycystic ovarian syndrome)   . PONV (postoperative nausea and vomiting)   . Prediabetes   . Recurrent upper respiratory infection (URI)    resolving  . SVT (supraventricular tachycardia) (HCC)    History of   . Vitamin D deficiency   . VSD (ventricular septal defect)    history of -no problems    PAST SURGICAL HISTORY: Past Surgical History:  Procedure Laterality Date  . CESAREAN SECTION  2010  . CESAREAN SECTION  11/16/2010   Procedure: CESAREAN SECTION;  Surgeon: Almon HerculesKendra H. Ross;  Location: [facility];  Service: Gynecology;  Laterality: N/A;  Repeat   . CHOLECYSTECTOMY  2003  . DILATION AND CURETTAGE OF UTERUS    . HYSTEROSCOPY W/D&C N/A 12/06/2014   Procedure: DILATATION AND CURETTAGE /HYSTEROSCOPY;  Surgeon: [name], MD;  Location: [facility];  Service: Gynecology;  Laterality: N/A;  . uterine polyps      SOCIAL HISTORY: Social History   Tobacco Use  . Smoking status: Never Smoker  . Smokeless tobacco: Never Used  Substance Use Topics  . Alcohol use: No  . Drug use: No    FAMILY HISTORY: Family History  Problem Relation Age of Onset  . Diabetes Father   . Hypertension Father   . Hyperlipidemia Father   . Sleep apnea Father   . Obesity Father     ROS: Review of Systems  Constitutional: Positive for malaise/fatigue. Negative for weight loss.  Cardiovascular: Negative for chest pain and claudication.  Gastrointestinal: Negative for nausea and vomiting.  Genitourinary: Negative for frequency.  Musculoskeletal: Negative for myalgias.       Negative muscle weakness  Endo/Heme/Allergies: Negative for polydipsia.    PHYSICAL EXAM: Blood pressure 120/75, pulse 74, temperature 97.7 F (36.5 C), temperature source Oral, height 5\' 7"  (1.702 m), weight 246 lb (111.6 kg), SpO2 98 %. Body mass index is 38.53 kg/m. Physical Exam Vitals signs reviewed.  Constitutional:      Appearance: Normal appearance. She is obese.  Cardiovascular:     Rate and Rhythm: Normal rate.      Pulses: Normal pulses.  Pulmonary:     Effort: Pulmonary effort is normal.     Breath sounds: Normal breath sounds.  Musculoskeletal: Normal range of motion.  Skin:    General: Skin is warm and dry.  Neurological:     Mental Status: She is alert and oriented to person, place, and time.  Psychiatric:        Mood and Affect: Mood normal.        Behavior: Behavior normal.     RECENT LABS AND TESTS:  BMET    Component Value Date/Time   NA 140 02/18/2018 0853   K 4.1 02/18/2018 0853   CL 102 02/18/2018 0853   CO2 22 02/18/2018 0853   GLUCOSE 77 02/18/2018 0853   GLUCOSE 127 (H) 11/16/2014 1625   BUN 15 02/18/2018 0853   CREATININE 0.67 02/18/2018 0853   CREATININE 0.62 01/20/2014 1317   CALCIUM 9.2 02/18/2018 0853   GFRNONAA 110 02/18/2018 0853   GFRAA 126 02/18/2018 0853   Lab Results  Component Value Date   HGBA1C 5.2 02/18/2018   HGBA1C 5.4 09/10/2017   HGBA1C 5.5 04/23/2017   HGBA1C 5.3 12/26/2016   HGBA1C 5.5 02/22/2016   Lab Results  Component Value Date   INSULIN 16.7 02/18/2018   INSULIN 24.7 09/10/2017   INSULIN 24.6 04/23/2017   INSULIN 18.1 12/26/2016   CBC    Component Value Date/Time   WBC 6.7 12/26/2016 0957   WBC 8.9 11/16/2014 1625   RBC 4.64 12/26/2016 0957   RBC 4.34 11/16/2014 1625   HGB 12.9 12/26/2016 0957   HCT 39.7 12/26/2016 0957   PLT 389 (H) 01/26/2015 0823   MCV 86 12/26/2016 0957   MCH 27.8 12/26/2016 0957   MCH 27.0 11/16/2014 1625   MCHC 32.5 12/26/2016 0957   MCHC 31.8 11/16/2014 1625   RDW 15.2 12/26/2016 0957   LYMPHSABS 1.7 12/26/2016 0957   EOSABS 0.1 12/26/2016 0957   BASOSABS 0.0 12/26/2016 0957   Iron/TIBC/Ferritin/ %Sat    Component Value Date/Time   FERRITIN 21 02/22/2016 0823   Lipid Panel     Component Value Date/Time   CHOL 150 02/18/2018 0853   TRIG 113 02/18/2018 0853   HDL 37 (L) 02/18/2018 0853   CHOLHDL 3.6 02/22/2016 0823   CHOLHDL 4.6 01/20/2014 1317   VLDL 22 01/20/2014 1317   LDLCALC 90  02/18/2018 0853   Hepatic Function Panel     Component Value Date/Time   PROT 6.8 02/18/2018 0853   ALBUMIN 4.0 02/18/2018 0853   AST 15 02/18/2018 0853   ALT 17 02/18/2018 0853   ALKPHOS 59 02/18/2018 0853   BILITOT 0.4 02/18/2018 0853   BILIDIR 0.09 02/22/2016 0823   IBILI 0.2 01/20/2014 1317      Component Value Date/Time   TSH 2.220 12/26/2016 0957   TSH 3.130 02/22/2016 0823   TSH 2.880 01/26/2015 0823      OBESITY BEHAVIORAL INTERVENTION VISIT  Today's visit was # 24   Starting weight: 260 lbs Starting date: 12/26/16 Today's weight : 246 lbs Today's date: 07/14/2018 Total lbs lost to date: 14    07/14/2018  Height 5\' 7"  (1.702 m)  Weight 246 lb (111.6 kg)  BMI (Calculated) 38.52  BLOOD PRESSURE - SYSTOLIC 120  BLOOD PRESSURE - DIASTOLIC 75   Body Fat % 46.4 %  Total Body Water (lbs) 91.6 lbs     ASK: We discussed the diagnosis of obesity with Gyselle S Micheals today and Thalia agreed to give [place] permission to discuss obesity behavioral modification therapy today.  ASSESS: Traci has the diagnosis of obesity and her BMI today is 38.52 Shila is in the action stage of change   ADVISE: Berit was educated on the multiple health risks of obesity as well as the benefit of weight loss to improve her health. She was advised of the need for long term treatment and the importance of lifestyle modifications to improve her current health and to decrease her risk of future health problems.  AGREE: Multiple dietary modification  options and treatment options were discussed and  Krystalynn agreed to follow the recommendations documented in the above note.  ARRANGE: Victorian was educated on the importance of frequent visits to treat obesity as outlined per CMS and USPSTF guidelines and agreed to schedule her next follow up appointment today.  I, Burt Knack, am acting as transcriptionist for Debbra Riding, MD  I have reviewed the above documentation for accuracy and  completeness, and I agree with the above. - Debbra Riding, MD

## 2018-07-14 NOTE — Progress Notes (Signed)
Ins res  

## 2018-07-15 LAB — LIPID PANEL WITH LDL/HDL RATIO
Cholesterol, Total: 163 mg/dL (ref 100–199)
HDL: 36 mg/dL — ABNORMAL LOW (ref >39)
LDL Calculated: 111 mg/dL — ABNORMAL HIGH (ref 0–99)
LDl/HDL Ratio: 3.1 ratio (ref 0.0–3.2)
TRIGLYCERIDES: 81 mg/dL (ref 0–149)
VLDL Cholesterol Cal: 16 mg/dL (ref 5–40)

## 2018-07-15 LAB — VITAMIN D 25 HYDROXY (VIT D DEFICIENCY, FRACTURES): Vit D, 25-Hydroxy: 26.1 ng/mL — ABNORMAL LOW (ref 30.0–100.0)

## 2018-07-15 LAB — INSULIN, RANDOM: INSULIN: 34.1 u[IU]/mL — AB (ref 2.6–24.9)

## 2018-07-15 LAB — HEMOGLOBIN A1C
Est. average glucose Bld gHb Est-mCnc: 108 mg/dL
Hgb A1c MFr Bld: 5.4 % (ref 4.8–5.6)

## 2018-07-29 ENCOUNTER — Encounter (INDEPENDENT_AMBULATORY_CARE_PROVIDER_SITE_OTHER): Payer: Self-pay

## 2018-08-04 ENCOUNTER — Encounter (INDEPENDENT_AMBULATORY_CARE_PROVIDER_SITE_OTHER): Payer: Self-pay

## 2018-08-11 ENCOUNTER — Ambulatory Visit (INDEPENDENT_AMBULATORY_CARE_PROVIDER_SITE_OTHER): Payer: 59 | Admitting: Family Medicine

## 2018-08-12 ENCOUNTER — Ambulatory Visit (INDEPENDENT_AMBULATORY_CARE_PROVIDER_SITE_OTHER): Payer: 59 | Admitting: Family Medicine

## 2018-08-12 ENCOUNTER — Encounter (INDEPENDENT_AMBULATORY_CARE_PROVIDER_SITE_OTHER): Payer: Self-pay | Admitting: Family Medicine

## 2018-08-12 ENCOUNTER — Other Ambulatory Visit: Payer: Self-pay

## 2018-08-12 DIAGNOSIS — E7849 Other hyperlipidemia: Secondary | ICD-10-CM

## 2018-08-12 DIAGNOSIS — Z6838 Body mass index (BMI) 38.0-38.9, adult: Secondary | ICD-10-CM

## 2018-08-12 DIAGNOSIS — R7303 Prediabetes: Secondary | ICD-10-CM

## 2018-08-12 NOTE — Progress Notes (Signed)
Office: [phone]  /  Fax: [phone] TeleHealth Visit:  Christina Rodriguez has verbally consented to this TeleHealth visit today. Christina patient is located at home, Christina provider is located at Christina [facility] office. Christina participants in this visit include Christina listed provider and patient and provider's assistant. Christina visit was conducted today via face time.  HPI:   Chief Complaint: OBESITY Christina Rodriguez is here to discuss her progress with her obesity treatment plan. She is on Christina keep a food journal with 1600-1700 calories and 95+ grams of protein daily and is following her eating plan approximately 90 % of Christina time. She states she is walking and outdoor activities. Christina Rodriguez is still working full time and so is her husband. Her kids are going to camp. She is having a hard time finding a lot of Christina typical foods she ans her family eats. She is having a difficult time finding bread. She is achieving protein goals (still supplementing with shakes) and then trying to hit meat to get protein in.  We were unable to weigh Christina patient today for this TeleHealth visit. She feels as if she has maintained her weight since her last visit. She has lost 14 lbs since starting treatment with [place].  Pre-Diabetes Christina Rodriguez has a diagnosis of pre-diabetes based on her elevated Hgb A1c and was informed this puts her at greater risk of developing diabetes. She denies GI side effects of metformin or Victoza, and denies carbohydrate cravings. She continues to work on diet and exercise to decrease risk of diabetes.   Hyperlipidemia Christina Rodriguez has hyperlipidemia and has been trying to improve her cholesterol levels with intensive lifestyle modification including a low saturated fat diet, exercise and weight loss. Her LDL was of 111 (previously 90), she is not on statin and per ASCVD calculation she is low risk. She denies any chest pain, claudication or myalgias.  ASSESSMENT AND PLAN:  Prediabetes  Other hyperlipidemia   Class 2 severe obesity with serious comorbidity and body mass index (BMI) of 38.0 to 38.9 in adult, unspecified obesity type [facility])  PLAN:  Pre-Diabetes Christina Rodriguez will continue to work on weight loss, exercise, and decreasing simple carbohydrates in her diet to help decrease Christina risk of diabetes. We dicussed metformin including benefits and risks. She was informed that eating too many simple carbohydrates or too many calories at one sitting increases Christina likelihood of GI side effects. Christina Rodriguez agrees to continue taking her current medications at current doses, no refills needed. Christina Rodriguez agrees to follow up with our clinic in 2 weeks as directed to monitor her progress.  Hyperlipidemia Christina Rodriguez was informed of Christina American Heart Association Guidelines emphasizing intensive lifestyle modifications as Christina first line treatment for hyperlipidemia. We discussed many lifestyle modifications today in depth, and Christina Rodriguez will continue to work on decreasing saturated fats such as fatty red meat, butter and many fried foods. She will also increase vegetables and lean protein in her diet and continue to work on exercise and weight loss efforts.  Obesity Christina Rodriguez is currently in Christina action stage of change. As such, her goal is to continue with weight loss efforts She has agreed to keep a food journal with 1600-1700 calories and 95+ grams of protein daily Christina Rodriguez has been instructed to work up to a goal of 150 minutes of combined cardio and strengthening exercise per week or she is to increase time outdoors for weight loss and overall health benefits. We discussed Christina following Behavioral Modification Strategies today: increasing lean  protein intake, increasing vegetables and work on meal planning and easy cooking plans, better snacking choices, and planning for success   Christina Rodriguez has agreed to follow up with our clinic in 2 weeks. She was informed of Christina importance of frequent follow up visits to maximize her success with  intensive lifestyle modifications for her multiple health conditions.  ALLERGIES: Allergies  Allergen Reactions  . Beta Adrenergic Blockers Shortness Of Breath  . Shrimp [Shellfish Allergy] Hives  . Soybeans Hives    MEDICATIONS: Current Outpatient Medications on File Prior to Visit  Medication Sig Dispense Refill  . albuterol (PROVENTIL HFA;VENTOLIN HFA) 108 (90 BASE) MCG/ACT inhaler Inhale 2 puffs into Christina lungs every 4 (four) hours as needed for wheezing or shortness of breath. 1 Inhaler 5  . cyanocobalamin 500 MCG tablet Take 500 mcg by mouth daily.    Marland Kitchen diltiazem (CARDIZEM CD) 240 MG 24 hr capsule Take 1 capsule (240 mg total) by mouth daily. 90 capsule 1  . EPINEPHrine 0.3 mg/0.3 mL IJ SOAJ injection Inject 0.3 mg into Christina muscle once. Reported on 05/03/2015    . fluconazole (DIFLUCAN) 150 MG tablet Take one tablet by mouth three days apart 2 tablet 0  . Fluticasone-Salmeterol (ADVAIR DISKUS) 250-50 MCG/DOSE AEPB Inhale 1 puff into Christina lungs 2 (two) times daily.      . hydrochlorothiazide (HYDRODIURIL) 25 MG tablet Take 1 tablet (25 mg total) by mouth daily. 90 tablet 3  . ibuprofen (ADVIL,MOTRIN) 200 MG tablet Take 400 mg by mouth every 6 (six) hours as needed for headache, mild pain or moderate pain.    . Insulin Pen Needle (PEN NEEDLES) 32G X 5 MM MISC Inject 1.8 mg into Christina skin daily. 100 each 3  . liraglutide (VICTOZA) 18 MG/3ML SOPN Take  1.8 mg subcu qd 9 pen 3  . metFORMIN (GLUCOPHAGE-XR) 500 MG 24 hr tablet Take 2 tablets (1,000 mg total) by mouth daily with breakfast. 180 tablet 3  . Multiple Vitamins-Minerals (MULTIVITAMIN ADULT PO) Take 1 tablet by mouth daily.    . pantoprazole (PROTONIX) 40 MG tablet Take 1 tablet (40 mg total) by mouth daily. 90 tablet 3  . rizatriptan (MAXALT-MLT) 10 MG disintegrating tablet Take 1 tablet (10 mg total) by mouth as needed for migraine. May repeat in 2 hours if needed; max 2 per 24 hours 27 tablet 1  . tiZANidine (ZANAFLEX) 4 MG  capsule Take one capsule by mouth three times daily 30 capsule 0  . Vitamin D, Ergocalciferol, (DRISDOL) 50000 units CAPS capsule Take 1 capsule (50,000 Units total) by mouth every 3 (three) days. 10 capsule 0   No current facility-administered medications on file prior to visit.     PAST MEDICAL HISTORY: Past Medical History:  Diagnosis Date  . Adenomyosis   . Anemia    HIstory of   . Asthma    albuterol inhaler prn-recent uri-using more freq  . Dysrhythmia    history of svt-controlled on cardizem-diagnosed in 1999-  . Gall bladder disease   . GERD (gastroesophageal reflux disease)    uses protonix  . Headache    migraines   . History of polycystic ovarian disease   . Infertility associated with anovulation   . Metabolic syndrome   . Multiple food allergies    shrimp  . Osteoarthritis   . Palpitations   . PCOS (polycystic ovarian syndrome)   . PONV (postoperative nausea and vomiting)   . Prediabetes   . Recurrent upper respiratory infection (URI)  resolving  . SVT (supraventricular tachycardia) (HCC)    History of   . Vitamin D deficiency   . VSD (ventricular septal defect)    history of -no problems    PAST SURGICAL HISTORY: Past Surgical History:  Procedure Laterality Date  . CESAREAN SECTION  2010  . CESAREAN SECTION  11/16/2010   Procedure: CESAREAN SECTION;  Surgeon: Almon Hercules;  Location: [facility];  Service: Gynecology;  Laterality: N/A;  Repeat   . CHOLECYSTECTOMY  2003  . DILATION AND CURETTAGE OF UTERUS    . HYSTEROSCOPY W/D&C N/A 12/06/2014   Procedure: DILATATION AND CURETTAGE /HYSTEROSCOPY;  Surgeon: Waynard Reeds, MD;  Location: [facility];  Service: Gynecology;  Laterality: N/A;  . uterine polyps      SOCIAL HISTORY: Social History   Tobacco Use  . Smoking status: Never Smoker  . Smokeless tobacco: Never Used  Substance Use Topics  . Alcohol use: No  . Drug use: No    FAMILY HISTORY: Family History  Problem Relation Age of Onset  . Diabetes  Father   . Hypertension Father   . Hyperlipidemia Father   . Sleep apnea Father   . Obesity Father     ROS: Review of Systems  Constitutional: Negative for weight loss.  Cardiovascular: Negative for chest pain and claudication.  Musculoskeletal: Negative for myalgias.    PHYSICAL EXAM: Pt in no acute distress  RECENT LABS AND TESTS: BMET    Component Value Date/Time   NA 140 02/18/2018 0853   K 4.1 02/18/2018 0853   CL 102 02/18/2018 0853   CO2 22 02/18/2018 0853   GLUCOSE 77 02/18/2018 0853   GLUCOSE 127 (H) 11/16/2014 1625   BUN 15 02/18/2018 0853   CREATININE 0.67 02/18/2018 0853   CREATININE 0.62 01/20/2014 1317   CALCIUM 9.2 02/18/2018 0853   GFRNONAA 110 02/18/2018 0853   GFRAA 126 02/18/2018 0853   Lab Results  Component Value Date   HGBA1C 5.4 07/14/2018   HGBA1C 5.2 02/18/2018   HGBA1C 5.4 09/10/2017   HGBA1C 5.5 04/23/2017   HGBA1C 5.3 12/26/2016   Lab Results  Component Value Date   INSULIN 34.1 (H) 07/14/2018   INSULIN 16.7 02/18/2018   INSULIN 24.7 09/10/2017   INSULIN 24.6 04/23/2017   INSULIN 18.1 12/26/2016   CBC    Component Value Date/Time   WBC 6.7 12/26/2016 0957   WBC 8.9 11/16/2014 1625   RBC 4.64 12/26/2016 0957   RBC 4.34 11/16/2014 1625   HGB 12.9 12/26/2016 0957   HCT 39.7 12/26/2016 0957   PLT 389 (H) 01/26/2015 0823   MCV 86 12/26/2016 0957   MCH 27.8 12/26/2016 0957   MCH 27.0 11/16/2014 1625   MCHC 32.5 12/26/2016 0957   MCHC 31.8 11/16/2014 1625   RDW 15.2 12/26/2016 0957   LYMPHSABS 1.7 12/26/2016 0957   EOSABS 0.1 12/26/2016 0957   BASOSABS 0.0 12/26/2016 0957   Iron/TIBC/Ferritin/ %Sat    Component Value Date/Time   FERRITIN 21 02/22/2016 0823   Lipid Panel     Component Value Date/Time   CHOL 163 07/14/2018 1421   TRIG 81 07/14/2018 1421   HDL 36 (L) 07/14/2018 1421   CHOLHDL 3.6 02/22/2016 0823   CHOLHDL 4.6 01/20/2014 1317   VLDL 22 01/20/2014 1317   LDLCALC 111 (H) 07/14/2018 1421   Hepatic  Function Panel     Component Value Date/Time   PROT 6.8 02/18/2018 0853   ALBUMIN 4.0 02/18/2018 0853   AST 15 02/18/2018  0853   ALT 17 02/18/2018 0853   ALKPHOS 59 02/18/2018 0853   BILITOT 0.4 02/18/2018 0853   BILIDIR 0.09 02/22/2016 0823   IBILI 0.2 01/20/2014 1317      Component Value Date/Time   TSH 2.220 12/26/2016 0957   TSH 3.130 02/22/2016 0823   TSH 2.880 01/26/2015 0823      I, Burt KnackSharon Martin, am acting as transcriptionist for Debbra RidingAlexandria Kadolph, MD  I have reviewed Christina above documentation for accuracy and completeness, and I agree with Christina above. - Debbra RidingAlexandria Kadolph, MD

## 2018-08-24 ENCOUNTER — Other Ambulatory Visit (INDEPENDENT_AMBULATORY_CARE_PROVIDER_SITE_OTHER): Payer: Self-pay | Admitting: Family Medicine

## 2018-08-24 DIAGNOSIS — E559 Vitamin D deficiency, unspecified: Secondary | ICD-10-CM

## 2018-08-24 MED FILL — metFORMIN HCL ER 500 MG TB2: 500 | 90 days supply | Qty: 180 | Fill #0

## 2018-08-24 MED FILL — PANTOPRAZOLE SOD DR 40 MG T: 40 | 90 days supply | Qty: 90 | Fill #0

## 2018-08-24 MED FILL — VICTOZA 18 MG/3 ML INJECT P: 18 | 90 days supply | Qty: 27 | Fill #0

## 2018-08-24 MED FILL — HYDROCHLOROTHIAZIDE 25 MG T: 25 | 90 days supply | Qty: 90 | Fill #0

## 2018-08-27 ENCOUNTER — Ambulatory Visit (INDEPENDENT_AMBULATORY_CARE_PROVIDER_SITE_OTHER): Payer: 59 | Admitting: Family Medicine

## 2018-08-27 ENCOUNTER — Encounter (INDEPENDENT_AMBULATORY_CARE_PROVIDER_SITE_OTHER): Payer: Self-pay | Admitting: Family Medicine

## 2018-08-27 ENCOUNTER — Other Ambulatory Visit: Payer: Self-pay

## 2018-08-27 DIAGNOSIS — E559 Vitamin D deficiency, unspecified: Secondary | ICD-10-CM

## 2018-08-27 DIAGNOSIS — E1165 Type 2 diabetes mellitus with hyperglycemia: Secondary | ICD-10-CM | POA: Diagnosis not present

## 2018-08-27 DIAGNOSIS — Z6838 Body mass index (BMI) 38.0-38.9, adult: Secondary | ICD-10-CM | POA: Diagnosis not present

## 2018-08-27 MED ORDER — VITAMIN D (ERGOCALCIFEROL) 1.25 MG (50000 UNIT) PO CAPS: 50000.0000 [IU] | ORAL_CAPSULE | ORAL | 0 refills | Status: DC

## 2018-08-27 MED FILL — VIT D2 1.25 MG (50,000 UNIT: 1.25 MG | 30 days supply | Qty: 10 | Fill #0

## 2018-09-01 NOTE — Progress Notes (Signed)
Office: [phone]  /  Fax: [phone] TeleHealth Visit:  Christina Rodriguez has verbally consented to this TeleHealth visit today. The patient is located at home, the provider is located at the [facility] office. The participants in this visit include the listed provider and patient. The visit was conducted today via face time.  HPI:   Chief Complaint: OBESITY Christina Rodriguez is here to discuss her progress with her obesity treatment plan. She is on the keep a food journal with 1600-1700 calories and 95+ grams of protein daily and is following her eating plan approximately 90 % of the time. She states she is walking for 30-45 minutes 3-4 times per week. 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 She is occasionally supplementing with protein shakes if not close to protein goal.  We were unable to weigh the patient today for this TeleHealth visit. She feels as if she has maintained her weight since her last visit. She has lost 14 lbs since starting treatment with [place].  Vitamin D Deficiency [name] has a diagnosis of vitamin D deficiency. She is currently taking prescription Vit D. She notes fatigue and denies nausea, vomiting or muscle weakness.  Diabetes II with Hyperglycemia [name] has a diagnosis of diabetes type II. [name] is on metformin and Victoza. Last Hgb A1c was within normal limits at 5.4. She notes carbohydrate cravings and denies hypoglycemia. She has been working on intensive lifestyle modifications including diet, exercise, and weight loss to help control her blood glucose levels.  ASSESSMENT AND PLAN:  Vitamin D deficiency - Plan: Vitamin D, Ergocalciferol, (DRISDOL) 1.25 MG (50000 UT) CAPS capsule  Type 2 diabetes mellitus with hyperglycemia, without long-term current use of insulin (HCC)  Class 2 severe obesity with serious comorbidity and body mass index  (BMI) of 38.0 to 38.9 in adult, unspecified obesity type (HCC)  PLAN:  Vitamin D Deficiency [name] was informed that low vitamin D levels contributes to fatigue and are associated with obesity, breast, and colon cancer. [name] agrees to continue taking prescription Vit D ,000 IU every week #4 and we will refill for 1 month. She will follow up for routine testing of vitamin D, at least 2-3 times per year. She was informed of the risk of over-replacement of vitamin D and agrees to not increase her dose unless she discusses this with [place] first. [name] agrees to follow up with our clinic in 3 weeks.  Diabetes II with Hyperglycemia [name] has been given extensive diabetes education by myself today including ideal fasting and post-prandial blood glucose readings, individual ideal Hgb A1c goals and hypoglycemia prevention. We discussed the importance of good blood sugar control to decrease the likelihood of diabetic complications such as nephropathy, neuropathy, limb loss, blindness, coronary artery disease, and death. We discussed the importance of intensive lifestyle modification including diet, exercise and weight loss as the first line treatment for diabetes. Owen agrees to continue her diabetes medications and we will repeat labs at the end of June. [name] agrees to follow up with our clinic in 3 weeks.  Obesity [name] is currently in the action stage of change. As such, her goal is to continue with weight loss efforts She has agreed to keep a food journal with 1500 calories and 90+ grams of protein daily [name] has been instructed to work up to a goal of 150 minutes of combined cardio and strengthening  exercise per week for weight loss and overall health benefits. We discussed the following Behavioral Modification Strategies today: increasing lean protein intake, increasing vegetables and work on meal planning and easy cooking plans, keeping healthy foods in the home, better snacking choices, and  planning for success   [name] has agreed to follow up with our clinic in 3 weeks. She was informed of the importance of frequent follow up visits to maximize her success with intensive lifestyle modifications for her multiple health conditions.  ALLERGIES: Allergies  Allergen Reactions  . Beta Adrenergic Blockers Shortness Of Breath  . Shrimp [Shellfish Allergy] Hives  . Soybeans Hives    MEDICATIONS: Current Outpatient Medications on File Prior to Visit  Medication Sig Dispense Refill  . albuterol (PROVENTIL HFA;VENTOLIN HFA) 108 (90 BASE) MCG/ACT inhaler Inhale 2 puffs into the lungs every 4 (four) hours as needed for wheezing or shortness of breath. 1 Inhaler 5  . cyanocobalamin 500 MCG tablet Take 500 mcg by mouth daily.    Marland Kitchen diltiazem (CARDIZEM CD) 240 MG 24 hr capsule Take 1 capsule (240 mg total) by mouth daily. 90 capsule 1  . EPINEPHrine 0.3 mg/0.3 mL IJ SOAJ injection Inject 0.3 mg into the muscle once. Reported on 05/03/2015    . fluconazole (DIFLUCAN) 150 MG tablet Take one tablet by mouth three days apart 2 tablet 0  . Fluticasone-Salmeterol (ADVAIR DISKUS) 250-50 MCG/DOSE AEPB Inhale 1 puff into the lungs 2 (two) times daily.      . hydrochlorothiazide (HYDRODIURIL) 25 MG tablet Take 1 tablet (25 mg total) by mouth daily. 90 tablet 3  . ibuprofen (ADVIL,MOTRIN) 200 MG tablet Take 400 mg by mouth every 6 (six) hours as needed for headache, mild pain or moderate pain.    . Insulin Pen Needle (PEN NEEDLES) 32G X 5 MM MISC Inject 1.8 mg into the skin daily. 100 each 3  . liraglutide (VICTOZA) 18 MG/3ML SOPN Take  1.8 mg subcu qd 9 pen 3  . metFORMIN (GLUCOPHAGE-XR) 500 MG 24 hr tablet Take 2 tablets (1,000 mg total) by mouth daily with breakfast. 180 tablet 3  . Multiple Vitamins-Minerals (MULTIVITAMIN ADULT PO) Take 1 tablet by mouth daily.    . pantoprazole (PROTONIX) 40 MG tablet Take 1 tablet (40 mg total) by mouth daily. 90 tablet 3  . rizatriptan (MAXALT-MLT) 10 MG  disintegrating tablet Take 1 tablet (10 mg total) by mouth as needed for migraine. May repeat in 2 hours if needed; max 2 per 24 hours 27 tablet 1  . tiZANidine (ZANAFLEX) 4 MG capsule Take one capsule by mouth three times daily 30 capsule 0   No current facility-administered medications on file prior to visit.     PAST MEDICAL HISTORY: Past Medical History:  Diagnosis Date  . Adenomyosis   . Anemia    HIstory of   . Asthma    albuterol inhaler prn-recent uri-using more freq  . Dysrhythmia    history of svt-controlled on cardizem-diagnosed in 1999-  . Gall bladder disease   . GERD (gastroesophageal reflux disease)    uses protonix  . Headache    migraines   . History of polycystic ovarian disease   . Infertility associated with anovulation   . Metabolic syndrome   . Multiple food allergies    shrimp  . Osteoarthritis   . Palpitations   . PCOS (polycystic ovarian syndrome)   . PONV (postoperative nausea and vomiting)   . Prediabetes   . Recurrent upper respiratory infection (URI)  resolving  . SVT (supraventricular tachycardia) (HCC)    History of   . Vitamin D deficiency   . VSD (ventricular septal defect)    history of -no problems    PAST SURGICAL HISTORY: Past Surgical History:  Procedure Laterality Date  . CESAREAN SECTION  2010  . CESAREAN SECTION  11/16/2010   Procedure: CESAREAN SECTION;  Surgeon: Almon HerculesKendra H. Ross;  Location: [facility];  Service: Gynecology;  Laterality: N/A;  Repeat   . CHOLECYSTECTOMY  2003  . DILATION AND CURETTAGE OF UTERUS    . HYSTEROSCOPY W/D&C N/A 12/06/2014   Procedure: DILATATION AND CURETTAGE /HYSTEROSCOPY;  Surgeon: [name], MD;  Location: [facility];  Service: Gynecology;  Laterality: N/A;  . uterine polyps      SOCIAL HISTORY: Social History   Tobacco Use  . Smoking status: Never Smoker  . Smokeless tobacco: Never Used  Substance Use Topics  . Alcohol use: No  . Drug use: No    FAMILY HISTORY: Family History  Problem  Relation Age of Onset  . Diabetes Father   . Hypertension Father   . Hyperlipidemia Father   . Sleep apnea Father   . Obesity Father     ROS: Review of Systems  Constitutional: Positive for malaise/fatigue. Negative for weight loss.  Gastrointestinal: Negative for nausea and vomiting.  Musculoskeletal:       Negative muscle weakness  Endo/Heme/Allergies:       Negative hypoglycemia    PHYSICAL EXAM: Pt in no acute distress  RECENT LABS AND TESTS: BMET    Component Value Date/Time   NA 140 02/18/2018 0853   K 4.1 02/18/2018 0853   CL 102 02/18/2018 0853   CO2 22 02/18/2018 0853   GLUCOSE 77 02/18/2018 0853   GLUCOSE 127 (H) 11/16/2014 1625   BUN 15 02/18/2018 0853   CREATININE 0.67 02/18/2018 0853   CREATININE 0.62 01/20/2014 1317   CALCIUM 9.2 02/18/2018 0853   GFRNONAA 110 02/18/2018 0853   GFRAA 126 02/18/2018 0853   Lab Results  Component Value Date   HGBA1C 5.4 07/14/2018   HGBA1C 5.2 02/18/2018   HGBA1C 5.4 09/10/2017   HGBA1C 5.5 04/23/2017   HGBA1C 5.3 12/26/2016   Lab Results  Component Value Date   INSULIN 34.1 (H) 07/14/2018   INSULIN 16.7 02/18/2018   INSULIN 24.7 09/10/2017   INSULIN 24.6 04/23/2017   INSULIN 18.1 12/26/2016   CBC    Component Value Date/Time   WBC 6.7 12/26/2016 0957   WBC 8.9 11/16/2014 1625   RBC 4.64 12/26/2016 0957   RBC 4.34 11/16/2014 1625   HGB 12.9 12/26/2016 0957   HCT 39.7 12/26/2016 0957   PLT 389 (H) 01/26/2015 0823   MCV 86 12/26/2016 0957   MCH 27.8 12/26/2016 0957   MCH 27.0 11/16/2014 1625   MCHC 32.5 12/26/2016 0957   MCHC 31.8 11/16/2014 1625   RDW 15.2 12/26/2016 0957   LYMPHSABS 1.7 12/26/2016 0957   EOSABS 0.1 12/26/2016 0957   BASOSABS 0.0 12/26/2016 0957   Iron/TIBC/Ferritin/ %Sat    Component Value Date/Time   FERRITIN 21 02/22/2016 0823   Lipid Panel     Component Value Date/Time   CHOL 163 07/14/2018 1421   TRIG 81 07/14/2018 1421   HDL 36 (L) 07/14/2018 1421   CHOLHDL 3.6  02/22/2016 0823   CHOLHDL 4.6 01/20/2014 1317   VLDL 22 01/20/2014 1317   LDLCALC 111 (H) 07/14/2018 1421   Hepatic Function Panel     Component Value  Date/Time   PROT 6.8 02/18/2018 0853   ALBUMIN 4.0 02/18/2018 0853   AST 15 02/18/2018 0853   ALT 17 02/18/2018 0853   ALKPHOS 59 02/18/2018 0853   BILITOT 0.4 02/18/2018 0853   BILIDIR 0.09 02/22/2016 0823   IBILI 0.2 01/20/2014 1317      Component Value Date/Time   TSH 2.220 12/26/2016 0957   TSH 3.130 02/22/2016 0823   TSH 2.880 01/26/2015 0823      I, Burt Knack, am acting as transcriptionist for Debbra Riding, MD  I have reviewed the above documentation for accuracy and completeness, and I agree with the above. - Debbra Riding, MD

## 2018-09-16 ENCOUNTER — Encounter (INDEPENDENT_AMBULATORY_CARE_PROVIDER_SITE_OTHER): Payer: Self-pay | Admitting: Family Medicine

## 2018-09-16 ENCOUNTER — Other Ambulatory Visit: Payer: Self-pay

## 2018-09-16 ENCOUNTER — Ambulatory Visit (INDEPENDENT_AMBULATORY_CARE_PROVIDER_SITE_OTHER): Payer: 59 | Admitting: Family Medicine

## 2018-09-16 DIAGNOSIS — E559 Vitamin D deficiency, unspecified: Secondary | ICD-10-CM | POA: Diagnosis not present

## 2018-09-16 DIAGNOSIS — R7303 Prediabetes: Secondary | ICD-10-CM | POA: Diagnosis not present

## 2018-09-16 DIAGNOSIS — Z6838 Body mass index (BMI) 38.0-38.9, adult: Secondary | ICD-10-CM | POA: Diagnosis not present

## 2018-09-16 NOTE — Progress Notes (Signed)
Office: [phone]  /  Fax: [phone] TeleHealth Visit:  Christina SheffieldAutumn S Rodriguez has verbally consented to this TeleHealth visit today. The patient is located at home, the provider is located at the [facility] office. The participants in this visit include the listed provider and patient. The visit was conducted today via face time.  HPI:   Chief Complaint: OBESITY Christina Rodriguez is here to discuss her progress with her obesity treatment plan. She is on the keep a food journal with 1500 calories and 90+ grams of protein daily and is following her eating plan approximately 90 % of the time. She states she is walking some. Christina Rodriguez has been journaling most days most of the time. She tends to do journaling for breakfast and lunch, and taper off for dinner. She voices some stress at work secondary to re-opening. Her weight this morning is of 245 lbs.  We were unable to weigh the patient today for this TeleHealth visit. She feels as if she has maintained her weight since her last visit. She has lost 14 lbs since starting treatment with us.  Vitamin D Deficiency Christina Rodriguez has a diagnosis of vitamin D deficiency. She is currently taking prescription Vit D. She notes fatigue and denies nausea, vomiting or muscle weakness.  Pre-Diabetes Christina Rodriguez has a diagnosis of pre-diabetes based on her elevated Hgb A1c and was informed this puts her at greater risk of developing diabetes. She denies feelings of hypoglycemia, and denies GI side effects of her medicine. She continues to work on diet and exercise to decrease risk of diabetes.   ASSESSMENT AND PLAN:  Vitamin D deficiency  Prediabetes  Class 2 severe obesity with serious comorbidity and body mass index (BMI) of 38.0 to 38.9 in adult, unspecified obesity type (HCC)  PLAN:  Vitamin D Deficiency Christina Rodriguez was informed that low vitamin D levels contributes to fatigue and are associated with obesity, breast, and colon cancer. Christina Rodriguez agrees to continue  taking prescription Vit D @50 ,000 IU every 3 days, no refill needed. She will follow up for routine testing of vitamin D, at least 2-3 times per year. She was informed of the risk of over-replacement of vitamin D and agrees to not increase her dose unless she discusses this with us first. Christina Rodriguez agrees to follow up with our clinic in 2 weeks.  Pre-Diabetes Christina Rodriguez will continue to work on weight loss, exercise, and decreasing simple carbohydrates in her diet to help decrease the risk of diabetes. We dicussed metformin including benefits and risks. She was informed that eating too many simple carbohydrates or too many calories at one sitting increases the likelihood of GI side effects. Christina Rodriguez agrees to continue her current medications, and we will repeat labs in late June. Christina Rodriguez agrees to follow up with our clinic in 2 weeks as directed to monitor her progress.  Obesity Christina Rodriguez is currently in the action stage of change. As such, her goal is to continue with weight loss efforts She has agreed to keep a food journal with 1500 calories and 90+ grams of protein daily Christina Rodriguez has been instructed to work up to a goal of 150 minutes of combined cardio and strengthening exercise per week for weight loss and overall health benefits. We discussed the following Behavioral Modification Strategies today: increasing lean protein intake, increasing vegetables and work on meal planning and easy cooking plans, keeping healthy foods in the home, planning for success, and keep a strict food journal   Christina Rodriguez has agreed to follow up with  our clinic in 2 weeks. She was informed of the importance of frequent follow up visits to maximize her success with intensive lifestyle modifications for her multiple health conditions.  ALLERGIES: Allergies  Allergen Reactions  . Beta Adrenergic Blockers Shortness Of Breath  . Shrimp [Shellfish Allergy] Hives  . Soybeans Hives    MEDICATIONS: Current Outpatient Medications on File  Prior to Visit  Medication Sig Dispense Refill  . albuterol (PROVENTIL HFA;VENTOLIN HFA) 108 (90 BASE) MCG/ACT inhaler Inhale 2 puffs into the lungs every 4 (four) hours as needed for wheezing or shortness of breath. 1 Inhaler 5  . cyanocobalamin 500 MCG tablet Take 500 mcg by mouth daily.    Marland Kitchen diltiazem (CARDIZEM CD) 240 MG 24 hr capsule Take 1 capsule (240 mg total) by mouth daily. 90 capsule 1  . EPINEPHrine 0.3 mg/0.3 mL IJ SOAJ injection Inject 0.3 mg into the muscle once. Reported on 05/03/2015    . fluconazole (DIFLUCAN) 150 MG tablet Take one tablet by mouth three days apart 2 tablet 0  . Fluticasone-Salmeterol (ADVAIR DISKUS) 250-50 MCG/DOSE AEPB Inhale 1 puff into the lungs 2 (two) times daily.      . hydrochlorothiazide (HYDRODIURIL) 25 MG tablet Take 1 tablet (25 mg total) by mouth daily. 90 tablet 3  . ibuprofen (ADVIL,MOTRIN) 200 MG tablet Take 400 mg by mouth every 6 (six) hours as needed for headache, mild pain or moderate pain.    . Insulin Pen Needle (PEN NEEDLES) 32G X 5 MM MISC Inject 1.8 mg into the skin daily. 100 each 3  . liraglutide (VICTOZA) 18 MG/3ML SOPN Take  1.8 mg subcu qd 9 pen 3  . metFORMIN (GLUCOPHAGE-XR) 500 MG 24 hr tablet Take 2 tablets (1,000 mg total) by mouth daily with breakfast. 180 tablet 3  . Multiple Vitamins-Minerals (MULTIVITAMIN ADULT PO) Take 1 tablet by mouth daily.    . pantoprazole (PROTONIX) 40 MG tablet Take 1 tablet (40 mg total) by mouth daily. 90 tablet 3  . rizatriptan (MAXALT-MLT) 10 MG disintegrating tablet Take 1 tablet (10 mg total) by mouth as needed for migraine. May repeat in 2 hours if needed; max 2 per 24 hours 27 tablet 1  . tiZANidine (ZANAFLEX) 4 MG capsule Take one capsule by mouth three times daily 30 capsule 0  . Vitamin D, Ergocalciferol, (DRISDOL) 1.25 MG (50000 UT) CAPS capsule Take 1 capsule (50,000 Units total) by mouth every 3 (three) days. 10 capsule 0   No current facility-administered medications on file prior to  visit.     PAST MEDICAL HISTORY: Past Medical History:  Diagnosis Date  . Adenomyosis   . Anemia    HIstory of   . Asthma    albuterol inhaler prn-recent uri-using more freq  . Dysrhythmia    history of svt-controlled on cardizem-diagnosed in 1999-  . Gall bladder disease   . GERD (gastroesophageal reflux disease)    uses protonix  . Headache    migraines   . History of polycystic ovarian disease   . Infertility associated with anovulation   . Metabolic syndrome   . Multiple food allergies    shrimp  . Osteoarthritis   . Palpitations   . PCOS (polycystic ovarian syndrome)   . PONV (postoperative nausea and vomiting)   . Prediabetes   . Recurrent upper respiratory infection (URI)    resolving  . SVT (supraventricular tachycardia) (HCC)    History of   . Vitamin D deficiency   . VSD (ventricular septal defect)  history of -no problems    PAST SURGICAL HISTORY: Past Surgical History:  Procedure Laterality Date  . CESAREAN SECTION  2010  . CESAREAN SECTION  11/16/2010   Procedure: CESAREAN SECTION;  Surgeon: Almon Hercules;  Location: [facility];  Service: Gynecology;  Laterality: N/A;  Repeat   . CHOLECYSTECTOMY  2003  . DILATION AND CURETTAGE OF UTERUS    . HYSTEROSCOPY W/D&C N/A 12/06/2014   Procedure: DILATATION AND CURETTAGE /HYSTEROSCOPY;  Surgeon: Waynard Reeds, MD;  Location: [facility];  Service: Gynecology;  Laterality: N/A;  . uterine polyps      SOCIAL HISTORY: Social History   Tobacco Use  . Smoking status: Never Smoker  . Smokeless tobacco: Never Used  Substance Use Topics  . Alcohol use: No  . Drug use: No    FAMILY HISTORY: Family History  Problem Relation Age of Onset  . Diabetes Father   . Hypertension Father   . Hyperlipidemia Father   . Sleep apnea Father   . Obesity Father     ROS: Review of Systems  Constitutional: Positive for malaise/fatigue. Negative for weight loss.  Gastrointestinal: Negative for nausea and vomiting.   Musculoskeletal:       Negative muscle weakness  Endo/Heme/Allergies:       Negative hypoglycemia    PHYSICAL EXAM: Pt in no acute distress  RECENT LABS AND TESTS: BMET    Component Value Date/Time   NA 140 02/18/2018 0853   K 4.1 02/18/2018 0853   CL 102 02/18/2018 0853   CO2 22 02/18/2018 0853   GLUCOSE 77 02/18/2018 0853   GLUCOSE 127 (H) 11/16/2014 1625   BUN 15 02/18/2018 0853   CREATININE 0.67 02/18/2018 0853   CREATININE 0.62 01/20/2014 1317   CALCIUM 9.2 02/18/2018 0853   GFRNONAA 110 02/18/2018 0853   GFRAA 126 02/18/2018 0853   Lab Results  Component Value Date   HGBA1C 5.4 07/14/2018   HGBA1C 5.2 02/18/2018   HGBA1C 5.4 09/10/2017   HGBA1C 5.5 04/23/2017   HGBA1C 5.3 12/26/2016   Lab Results  Component Value Date   INSULIN 34.1 (H) 07/14/2018   INSULIN 16.7 02/18/2018   INSULIN 24.7 09/10/2017   INSULIN 24.6 04/23/2017   INSULIN 18.1 12/26/2016   CBC    Component Value Date/Time   WBC 6.7 12/26/2016 0957   WBC 8.9 11/16/2014 1625   RBC 4.64 12/26/2016 0957   RBC 4.34 11/16/2014 1625   HGB 12.9 12/26/2016 0957   HCT 39.7 12/26/2016 0957   PLT 389 (H) 01/26/2015 0823   MCV 86 12/26/2016 0957   MCH 27.8 12/26/2016 0957   MCH 27.0 11/16/2014 1625   MCHC 32.5 12/26/2016 0957   MCHC 31.8 11/16/2014 1625   RDW 15.2 12/26/2016 0957   LYMPHSABS 1.7 12/26/2016 0957   EOSABS 0.1 12/26/2016 0957   BASOSABS 0.0 12/26/2016 0957   Iron/TIBC/Ferritin/ %Sat    Component Value Date/Time   FERRITIN 21 02/22/2016 0823   Lipid Panel     Component Value Date/Time   CHOL 163 07/14/2018 1421   TRIG 81 07/14/2018 1421   HDL 36 (L) 07/14/2018 1421   CHOLHDL 3.6 02/22/2016 0823   CHOLHDL 4.6 01/20/2014 1317   VLDL 22 01/20/2014 1317   LDLCALC 111 (H) 07/14/2018 1421   Hepatic Function Panel     Component Value Date/Time   PROT 6.8 02/18/2018 0853   ALBUMIN 4.0 02/18/2018 0853   AST 15 02/18/2018 0853   ALT 17 02/18/2018 0853   ALKPHOS 59  02/18/2018 0853   BILITOT 0.4 02/18/2018 0853   BILIDIR 0.09 02/22/2016 0823   IBILI 0.2 01/20/2014 1317      Component Value Date/Time   TSH 2.220 12/26/2016 0957   TSH 3.130 02/22/2016 0823   TSH 2.880 01/26/2015 0823      I, Burt Knack, am acting as transcriptionist for Debbra Riding, MD  I have reviewed the above documentation for accuracy and completeness, and I agree with the above. - Debbra Riding, MD

## 2018-10-01 ENCOUNTER — Ambulatory Visit (INDEPENDENT_AMBULATORY_CARE_PROVIDER_SITE_OTHER): Payer: 59 | Admitting: Family Medicine

## 2018-10-01 ENCOUNTER — Encounter (INDEPENDENT_AMBULATORY_CARE_PROVIDER_SITE_OTHER): Payer: Self-pay | Admitting: Family Medicine

## 2018-10-01 ENCOUNTER — Other Ambulatory Visit: Payer: Self-pay

## 2018-10-01 DIAGNOSIS — Z6838 Body mass index (BMI) 38.0-38.9, adult: Secondary | ICD-10-CM

## 2018-10-01 DIAGNOSIS — R7303 Prediabetes: Secondary | ICD-10-CM

## 2018-10-01 DIAGNOSIS — E7849 Other hyperlipidemia: Secondary | ICD-10-CM

## 2018-10-01 NOTE — Progress Notes (Signed)
Office: [phone]  /  Fax: [phone] TeleHealth Visit:  [name] has verbally consented to this TeleHealth visit today. The patient is located at home, the provider is located at the [facility] office. The participants in this visit include the listed provider and patient. The visit was conducted today via face time.  HPI:   Chief Complaint: OBESITY Christina Rodriguez is here to discuss her progress with her obesity treatment plan. She is on the keep a food journal with 1500 calories and 90+ grams of protein daily and is following her eating plan approximately 90 % of the time. She states she is walking and playing outside. [name]'s weight is of 246 lbs. She says she is still working on getting in the 90 grams of protein, most days she is getting around 70-80 grams of protein. She says her family will be traveling on the weekend of [place] 6[place]7 [place].  We were unable to weigh the patient today for this TeleHealth visit. She feels as if she has gained 1 lb since her last visit. She has lost 14 lbs since starting treatment with [place].  Pre-Diabetes [name] has a diagnosis of pre-diabetes based on her elevated Hgb A1c and was informed this puts her at greater risk of developing diabetes. She notes carbohydrate cravings and is still struggling with getting all protein in. She is taking metformin currently and continues to work on diet and exercise to decrease risk of diabetes. She denies feelings of hypoglycemia.  Hyperlipidemia [name] has hyperlipidemia and has been trying to improve her cholesterol levels with intensive lifestyle modification including a low saturated fat diet, exercise and weight loss. Last LDL was of 111 and HDL of 36. She is not on statin and denies any chest pain, claudication or myalgias.  ASSESSMENT AND PLAN:  Prediabetes  Other hyperlipidemia  Class 2 severe obesity with serious comorbidity and body mass index (BMI) of 38.0 to 38.9 in adult,  unspecified obesity type [facility])  PLAN:  Pre-Diabetes [name] will continue to work on weight loss, exercise, and decreasing simple carbohydrates in her diet to help decrease the risk of diabetes. We dicussed metformin including benefits and risks. She was informed that eating too many simple carbohydrates or too many calories at one sitting increases the likelihood of GI side effects. [name] agrees to continue taking her current medications, and she agrees to to follow up with our clinic in 2 weeks as directed to monitor her progress.  Hyperlipidemia [name] was informed of the American Heart Association Guidelines emphasizing intensive lifestyle modifications as the first line treatment for hyperlipidemia. We discussed many lifestyle modifications today in depth, and [name] will continue to work on decreasing saturated fats such as fatty red meat, butter and many fried foods. She will also increase vegetables and lean protein in her diet and continue to work on exercise and weight loss efforts. We will repeat labs at her first in person appointment. Acire agrees to follow up with our clinic in 2 weeks.  Obesity Talena is currently in the action stage of change. As such, her goal is to continue with weight loss efforts She has agreed to keep a food journal with 1500 calories and 90+ grams of protein daily [name] has been instructed to work up to a goal of 150 minutes of combined cardio and strengthening exercise per week for weight loss and overall health benefits. We discussed the following Behavioral Modification Strategies today: increasing lean protein intake, increasing vegetables and work  on meal planning and easy cooking plans, keeping healthy foods in the home, and planning for success   [name] has agreed to follow up with our clinic in 2 weeks. She was informed of the importance of frequent follow up visits to maximize her success with intensive lifestyle modifications for her multiple  health conditions.  ALLERGIES: Allergies  Allergen Reactions  . Beta Adrenergic Blockers Shortness Of Breath  . Shrimp [Shellfish Allergy] Hives  . Soybeans Hives    MEDICATIONS: Current Outpatient Medications on File Prior to Visit  Medication Sig Dispense Refill  . albuterol (PROVENTIL HFA;VENTOLIN HFA) 108 (90 BASE) MCG/ACT inhaler Inhale 2 puffs into the lungs every 4 (four) hours as needed for wheezing or shortness of breath. 1 Inhaler 5  . cyanocobalamin 500 MCG tablet Take 500 mcg by mouth daily.    Marland Kitchen diltiazem (CARDIZEM CD) 240 MG 24 hr capsule Take 1 capsule (240 mg total) by mouth daily. 90 capsule 1  . EPINEPHrine 0.3 mg/0.3 mL IJ SOAJ injection Inject 0.3 mg into the muscle once. Reported on 05/03/2015    . fluconazole (DIFLUCAN) 150 MG tablet Take one tablet by mouth three days apart 2 tablet 0  . Fluticasone-Salmeterol (ADVAIR DISKUS) 250-50 MCG/DOSE AEPB Inhale 1 puff into the lungs 2 (two) times daily.      . hydrochlorothiazide (HYDRODIURIL) 25 MG tablet Take 1 tablet (25 mg total) by mouth daily. 90 tablet 3  . ibuprofen (ADVIL,MOTRIN) 200 MG tablet Take 400 mg by mouth every 6 (six) hours as needed for headache, mild pain or moderate pain.    . Insulin Pen Needle (PEN NEEDLES) 32G X 5 MM MISC Inject 1.8 mg into the skin daily. 100 each 3  . liraglutide (VICTOZA) 18 MG/3ML SOPN Take  1.8 mg subcu qd 9 pen 3  . metFORMIN (GLUCOPHAGE-XR) 500 MG 24 hr tablet Take 2 tablets (1,000 mg total) by mouth daily with breakfast. 180 tablet 3  . Multiple Vitamins-Minerals (MULTIVITAMIN ADULT PO) Take 1 tablet by mouth daily.    . pantoprazole (PROTONIX) 40 MG tablet Take 1 tablet (40 mg total) by mouth daily. 90 tablet 3  . rizatriptan (MAXALT-MLT) 10 MG disintegrating tablet Take 1 tablet (10 mg total) by mouth as needed for migraine. May repeat in 2 hours if needed; max 2 per 24 hours 27 tablet 1  . tiZANidine (ZANAFLEX) 4 MG capsule Take one capsule by mouth three times daily 30  capsule 0  . Vitamin D, Ergocalciferol, (DRISDOL) 1.25 MG (50000 UT) CAPS capsule Take 1 capsule (50,000 Units total) by mouth every 3 (three) days. 10 capsule 0   No current facility-administered medications on file prior to visit.     PAST MEDICAL HISTORY: Past Medical History:  Diagnosis Date  . Adenomyosis   . Anemia    HIstory of   . Asthma    albuterol inhaler prn-recent uri-using more freq  . Dysrhythmia    history of svt-controlled on cardizem-diagnosed in 1999-  . Gall bladder disease   . GERD (gastroesophageal reflux disease)    uses protonix  . Headache    migraines   . History of polycystic ovarian disease   . Infertility associated with anovulation   . Metabolic syndrome   . Multiple food allergies    shrimp  . Osteoarthritis   . Palpitations   . PCOS (polycystic ovarian syndrome)   . PONV (postoperative nausea and vomiting)   . Prediabetes   . Recurrent upper respiratory infection (URI)  resolving  . SVT (supraventricular tachycardia) (HCC)    History of   . Vitamin D deficiency   . VSD (ventricular septal defect)    history of -no problems    PAST SURGICAL HISTORY: Past Surgical History:  Procedure Laterality Date  . CESAREAN SECTION  2010  . CESAREAN SECTION  11/16/2010   Procedure: CESAREAN SECTION;  Surgeon: Almon HerculesKendra H. Ross;  Location: [facility];  Service: Gynecology;  Laterality: N/A;  Repeat   . CHOLECYSTECTOMY  2003  . DILATION AND CURETTAGE OF UTERUS    . HYSTEROSCOPY W/D&C N/A 12/06/2014   Procedure: DILATATION AND CURETTAGE /HYSTEROSCOPY;  Surgeon: [name], MD;  Location: [facility];  Service: Gynecology;  Laterality: N/A;  . uterine polyps      SOCIAL HISTORY: Social History   Tobacco Use  . Smoking status: Never Smoker  . Smokeless tobacco: Never Used  Substance Use Topics  . Alcohol use: No  . Drug use: No    FAMILY HISTORY: Family History  Problem Relation Age of Onset  . Diabetes Father   . Hypertension Father   .  Hyperlipidemia Father   . Sleep apnea Father   . Obesity Father     ROS: Review of Systems  Constitutional: Negative for weight loss.  Cardiovascular: Negative for chest pain and claudication.  Musculoskeletal: Negative for myalgias.  Endo/Heme/Allergies:       Negative hypoglycemia    PHYSICAL EXAM: Pt in no acute distress  RECENT LABS AND TESTS: BMET    Component Value Date/Time   NA 140 02/18/2018 0853   K 4.1 02/18/2018 0853   CL 102 02/18/2018 0853   CO2 22 02/18/2018 0853   GLUCOSE 77 02/18/2018 0853   GLUCOSE 127 (H) 11/16/2014 1625   BUN 15 02/18/2018 0853   CREATININE 0.67 02/18/2018 0853   CREATININE 0.62 01/20/2014 1317   CALCIUM 9.2 02/18/2018 0853   GFRNONAA 110 02/18/2018 0853   GFRAA 126 02/18/2018 0853   Lab Results  Component Value Date   HGBA1C 5.4 07/14/2018   HGBA1C 5.2 02/18/2018   HGBA1C 5.4 09/10/2017   HGBA1C 5.5 04/23/2017   HGBA1C 5.3 12/26/2016   Lab Results  Component Value Date   INSULIN 34.1 (H) 07/14/2018   INSULIN 16.7 02/18/2018   INSULIN 24.7 09/10/2017   INSULIN 24.6 04/23/2017   INSULIN 18.1 12/26/2016   CBC    Component Value Date/Time   WBC 6.7 12/26/2016 0957   WBC 8.9 11/16/2014 1625   RBC 4.64 12/26/2016 0957   RBC 4.34 11/16/2014 1625   HGB 12.9 12/26/2016 0957   HCT 39.7 12/26/2016 0957   PLT 389 (H) 01/26/2015 0823   MCV 86 12/26/2016 0957   MCH 27.8 12/26/2016 0957   MCH 27.0 11/16/2014 1625   MCHC 32.5 12/26/2016 0957   MCHC 31.8 11/16/2014 1625   RDW 15.2 12/26/2016 0957   LYMPHSABS 1.7 12/26/2016 0957   EOSABS 0.1 12/26/2016 0957   BASOSABS 0.0 12/26/2016 0957   Iron/TIBC/Ferritin/ %Sat    Component Value Date/Time   FERRITIN 21 02/22/2016 0823   Lipid Panel     Component Value Date/Time   CHOL 163 07/14/2018 1421   TRIG 81 07/14/2018 1421   HDL 36 (L) 07/14/2018 1421   CHOLHDL 3.6 02/22/2016 0823   CHOLHDL 4.6 01/20/2014 1317   VLDL 22 01/20/2014 1317   LDLCALC 111 (H) 07/14/2018 1421    Hepatic Function Panel     Component Value Date/Time   PROT 6.8 02/18/2018 0853  ALBUMIN 4.0 02/18/2018 0853   AST 15 02/18/2018 0853   ALT 17 02/18/2018 0853   ALKPHOS 59 02/18/2018 0853   BILITOT 0.4 02/18/2018 0853   BILIDIR 0.09 02/22/2016 0823   IBILI 0.2 01/20/2014 1317      Component Value Date/Time   TSH 2.220 12/26/2016 0957   TSH 3.130 02/22/2016 0823   TSH 2.880 01/26/2015 0823      I, Burt Knack, am acting as transcriptionist for Debbra Riding, MD  I have reviewed the above documentation for accuracy and completeness, and I agree with the above. - Debbra Riding, MD

## 2018-10-08 ENCOUNTER — Other Ambulatory Visit: Payer: Self-pay | Admitting: Family Medicine

## 2018-10-08 MED ORDER — FLUCONAZOLE 150 MG PO TABS: ORAL_TABLET | ORAL | 0 refills | Status: DC

## 2018-10-09 DIAGNOSIS — Z124 Encounter for screening for malignant neoplasm of cervix: Secondary | ICD-10-CM | POA: Diagnosis not present

## 2018-10-09 DIAGNOSIS — Z01419 Encounter for gynecological examination (general) (routine) without abnormal findings: Secondary | ICD-10-CM | POA: Diagnosis not present

## 2018-10-09 DIAGNOSIS — Z1231 Encounter for screening mammogram for malignant neoplasm of breast: Secondary | ICD-10-CM | POA: Diagnosis not present

## 2018-10-14 ENCOUNTER — Ambulatory Visit: Payer: 59 | Admitting: Nurse Practitioner

## 2018-10-15 ENCOUNTER — Other Ambulatory Visit: Payer: Self-pay

## 2018-10-15 ENCOUNTER — Encounter (INDEPENDENT_AMBULATORY_CARE_PROVIDER_SITE_OTHER): Payer: Self-pay | Admitting: Family Medicine

## 2018-10-15 ENCOUNTER — Ambulatory Visit (INDEPENDENT_AMBULATORY_CARE_PROVIDER_SITE_OTHER): Payer: 59 | Admitting: Family Medicine

## 2018-10-15 DIAGNOSIS — Z6838 Body mass index (BMI) 38.0-38.9, adult: Secondary | ICD-10-CM | POA: Diagnosis not present

## 2018-10-15 DIAGNOSIS — E7849 Other hyperlipidemia: Secondary | ICD-10-CM

## 2018-10-15 DIAGNOSIS — E8881 Metabolic syndrome: Secondary | ICD-10-CM | POA: Diagnosis not present

## 2018-10-19 NOTE — Progress Notes (Signed)
Office: [phone]  /  Fax: [phone] TeleHealth Visit:  Dairl Ponder has verbally consented to this TeleHealth visit today. The patient is located at home, the provider is located at the [facility] office. The participants in this visit include the listed provider and patient. Kimberely was unable to use realtime audiovisual technology today and the telehealth visit was conducted via audio only.   HPI:   Chief Complaint: OBESITY Carman is here to discuss her progress with her obesity treatment plan. She is on the keep a food journal with 1500 calories and 90+ grams of protein daily and is following her eating plan approximately 90 % of the time. She states she is walking for 30 minutes 3-4 times per week. Kiira [place] the [place], and has little cell phone service. She is not weighing herself at home. She is planning to come back in 3 days. She is journaling most days and is trying to limit eating out while away. She is eating mostly around 1500 calories except a few days on vacation.  We were unable to weigh the patient today for this TeleHealth visit. She feels as if she has maintained her weight since her last visit. She has lost 14 lbs since starting treatment with [place].  Hyperlipidemia Amarylis has hyperlipidemia and has been trying to improve her cholesterol levels with intensive lifestyle modification including a low saturated fat diet, exercise and weight loss. Last LDL was of 111 and HDL of 36. She is not on statin and denies any chest pain, claudication or myalgias.  Insulin Resistance Carriann has a diagnosis of insulin resistance based on her elevated fasting insulin level >5. Last insulin level was of 34.1 on 07/14/2018. Although Latish's blood glucose readings are still under good control, insulin resistance puts her at greater risk of metabolic syndrome and diabetes. She denies feelings of hypoglycemia on Victoza and metformin. She continues to  work on diet and exercise to decrease risk of diabetes.  ASSESSMENT AND PLAN:  Other hyperlipidemia  Insulin resistance  Class 2 severe obesity with serious comorbidity and body mass index (BMI) of 38.0 to 38.9 in adult, unspecified obesity type ([place])  PLAN:  Hyperlipidemia Kendrea was informed of the American Heart Association Guidelines emphasizing intensive lifestyle modifications as the first line treatment for hyperlipidemia. We discussed many lifestyle modifications today in depth, and Sopheap will continue to work on decreasing saturated fats such as fatty red meat, butter and many fried foods. She will also increase vegetables and lean protein in her diet and continue to work on exercise and weight loss efforts. We will repeat FLP at her first in office appointment. Ayomide agrees to follow up with our clinic in 2 weeks.  Insulin Resistance Alysabeth will continue to work on weight loss, exercise, and decreasing simple carbohydrates in her diet to help decrease the risk of diabetes. We dicussed metformin including benefits and risks. She was informed that eating too many simple carbohydrates or too many calories at one sitting increases the likelihood of GI side effects. Flannery agrees to continue her current medications and we will repeat labs at her first in office appointment. Jaydi agrees to follow up with our clinic in 2 weeks as directed to monitor her progress.  Obesity Delories is currently in the action stage of change. As such, her goal is to continue with weight loss efforts She has agreed to keep a food journal with 1500 calories and 90+ grams of protein daily Doaa  has been instructed to work up to a goal of 150 minutes of combined cardio and strengthening exercise per week for weight loss and overall health benefits. We discussed the following Behavioral Modification Strategies today: increasing vegetables, decrease eating out, work on meal planning and easy cooking plans,  travel eating strategies, and planning for success    Etta has agreed to follow up with our clinic in 2 weeks. She was informed of the importance of frequent follow up visits to maximize her success with intensive lifestyle modifications for her multiple health conditions.  ALLERGIES: Allergies  Allergen Reactions  . Beta Adrenergic Blockers Shortness Of Breath  . Shrimp [Shellfish Allergy] Hives  . Soybeans Hives    MEDICATIONS: Current Outpatient Medications on File Prior to Visit  Medication Sig Dispense Refill  . albuterol (PROVENTIL HFA;VENTOLIN HFA) 108 (90 BASE) MCG/ACT inhaler Inhale 2 puffs into the lungs every 4 (four) hours as needed for wheezing or shortness of breath. 1 Inhaler 5  . cyanocobalamin 500 MCG tablet Take 500 mcg by mouth daily.    Marland Kitchen. diltiazem (CARDIZEM CD) 240 MG 24 hr capsule Take 1 capsule (240 mg total) by mouth daily. 90 capsule 1  . EPINEPHrine 0.3 mg/0.3 mL IJ SOAJ injection Inject 0.3 mg into the muscle once. Reported on 05/03/2015    . fluconazole (DIFLUCAN) 150 MG tablet Take one tablet by mouth three days apart 2 tablet 0  . fluconazole (DIFLUCAN) 150 MG tablet Take one tablet by mouth three days apart 2 tablet 0  . Fluticasone-Salmeterol (ADVAIR DISKUS) 250-50 MCG/DOSE AEPB Inhale 1 puff into the lungs 2 (two) times daily.      . hydrochlorothiazide (HYDRODIURIL) 25 MG tablet Take 1 tablet (25 mg total) by mouth daily. 90 tablet 3  . ibuprofen (ADVIL,MOTRIN) 200 MG tablet Take 400 mg by mouth every 6 (six) hours as needed for headache, mild pain or moderate pain.    . Insulin Pen Needle (PEN NEEDLES) 32G X 5 MM MISC Inject 1.8 mg into the skin daily. 100 each 3  . liraglutide (VICTOZA) 18 MG/3ML SOPN Take  1.8 mg subcu qd 9 pen 3  . metFORMIN (GLUCOPHAGE-XR) 500 MG 24 hr tablet Take 2 tablets (1,000 mg total) by mouth daily with breakfast. 180 tablet 3  . Multiple Vitamins-Minerals (MULTIVITAMIN ADULT PO) Take 1 tablet by mouth daily.    .  pantoprazole (PROTONIX) 40 MG tablet Take 1 tablet (40 mg total) by mouth daily. 90 tablet 3  . rizatriptan (MAXALT-MLT) 10 MG disintegrating tablet Take 1 tablet (10 mg total) by mouth as needed for migraine. May repeat in 2 hours if needed; max 2 per 24 hours 27 tablet 1  . tiZANidine (ZANAFLEX) 4 MG capsule Take one capsule by mouth three times daily 30 capsule 0  . Vitamin D, Ergocalciferol, (DRISDOL) 1.25 MG (50000 UT) CAPS capsule Take 1 capsule (50,000 Units total) by mouth every 3 (three) days. 10 capsule 0   No current facility-administered medications on file prior to visit.     PAST MEDICAL HISTORY: Past Medical History:  Diagnosis Date  . Adenomyosis   . Anemia    HIstory of   . Asthma    albuterol inhaler prn-recent uri-using more freq  . Dysrhythmia    history of svt-controlled on cardizem-diagnosed in 1999-  . Gall bladder disease   . GERD (gastroesophageal reflux disease)    uses protonix  . Headache    migraines   . History of polycystic ovarian disease   .  Infertility associated with anovulation   . Metabolic syndrome   . Multiple food allergies    shrimp  . Osteoarthritis   . Palpitations   . PCOS (polycystic ovarian syndrome)   . PONV (postoperative nausea and vomiting)   . Prediabetes   . Recurrent upper respiratory infection (URI)    resolving  . SVT (supraventricular tachycardia) (HCC)    History of   . Vitamin D deficiency   . VSD (ventricular septal defect)    history of -no problems    PAST SURGICAL HISTORY: Past Surgical History:  Procedure Laterality Date  . CESAREAN SECTION  2010  . CESAREAN SECTION  11/16/2010   Procedure: CESAREAN SECTION;  Surgeon: Almon HerculesKendra H. Ross;  Location: [facility];  Service: Gynecology;  Laterality: N/A;  Repeat   . CHOLECYSTECTOMY  2003  . DILATION AND CURETTAGE OF UTERUS    . HYSTEROSCOPY W/D&C N/A 12/06/2014   Procedure: DILATATION AND CURETTAGE /HYSTEROSCOPY;  Surgeon: [name], MD;  Location: [facility];  Service:  Gynecology;  Laterality: N/A;  . uterine polyps      SOCIAL HISTORY: Social History   Tobacco Use  . Smoking status: Never Smoker  . Smokeless tobacco: Never Used  Substance Use Topics  . Alcohol use: No  . Drug use: No    FAMILY HISTORY: Family History  Problem Relation Age of Onset  . Diabetes Father   . Hypertension Father   . Hyperlipidemia Father   . Sleep apnea Father   . Obesity Father     ROS: Review of Systems  Constitutional: Negative for weight loss.  Cardiovascular: Negative for chest pain and claudication.  Musculoskeletal: Negative for myalgias.  Endo/Heme/Allergies:       Negative hypoglycemia    PHYSICAL EXAM: Pt in no acute distress  RECENT LABS AND TESTS: BMET    Component Value Date/Time   NA 140 02/18/2018 0853   K 4.1 02/18/2018 0853   CL 102 02/18/2018 0853   CO2 22 02/18/2018 0853   GLUCOSE 77 02/18/2018 0853   GLUCOSE 127 (H) 11/16/2014 1625   BUN 15 02/18/2018 0853   CREATININE 0.67 02/18/2018 0853   CREATININE 0.62 01/20/2014 1317   CALCIUM 9.2 02/18/2018 0853   GFRNONAA 110 02/18/2018 0853   GFRAA 126 02/18/2018 0853   Lab Results  Component Value Date   HGBA1C 5.4 07/14/2018   HGBA1C 5.2 02/18/2018   HGBA1C 5.4 09/10/2017   HGBA1C 5.5 04/23/2017   HGBA1C 5.3 12/26/2016   Lab Results  Component Value Date   INSULIN 34.1 (H) 07/14/2018   INSULIN 16.7 02/18/2018   INSULIN 24.7 09/10/2017   INSULIN 24.6 04/23/2017   INSULIN 18.1 12/26/2016   CBC    Component Value Date/Time   WBC 6.7 12/26/2016 0957   WBC 8.9 11/16/2014 1625   RBC 4.64 12/26/2016 0957   RBC 4.34 11/16/2014 1625   HGB 12.9 12/26/2016 0957   HCT 39.7 12/26/2016 0957   PLT 389 (H) 01/26/2015 0823   MCV 86 12/26/2016 0957   MCH 27.8 12/26/2016 0957   MCH 27.0 11/16/2014 1625   MCHC 32.5 12/26/2016 0957   MCHC 31.8 11/16/2014 1625   RDW 15.2 12/26/2016 0957   LYMPHSABS 1.7 12/26/2016 0957   EOSABS 0.1 12/26/2016 0957   BASOSABS 0.0 12/26/2016  0957   Iron/TIBC/Ferritin/ %Sat    Component Value Date/Time   FERRITIN 21 02/22/2016 0823   Lipid Panel     Component Value Date/Time   CHOL 163 07/14/2018 1421  TRIG 81 07/14/2018 1421   HDL 36 (L) 07/14/2018 1421   CHOLHDL 3.6 02/22/2016 0823   CHOLHDL 4.6 01/20/2014 1317   VLDL 22 01/20/2014 1317   LDLCALC 111 (H) 07/14/2018 1421   Hepatic Function Panel     Component Value Date/Time   PROT 6.8 02/18/2018 0853   ALBUMIN 4.0 02/18/2018 0853   AST 15 02/18/2018 0853   ALT 17 02/18/2018 0853   ALKPHOS 59 02/18/2018 0853   BILITOT 0.4 02/18/2018 0853   BILIDIR 0.09 02/22/2016 0823   IBILI 0.2 01/20/2014 1317      Component Value Date/Time   TSH 2.220 12/26/2016 0957   TSH 3.130 02/22/2016 0823   TSH 2.880 01/26/2015 0823      I, Burt KnackSharon Martin, am acting as transcriptionist for Debbra RidingAlexandria Kadolph, MD  I have reviewed the above documentation for accuracy and completeness, and I agree with the above. - Debbra RidingAlexandria Kadolph, MD

## 2018-10-28 ENCOUNTER — Encounter: Payer: Self-pay | Admitting: Nurse Practitioner

## 2018-10-28 ENCOUNTER — Ambulatory Visit (INDEPENDENT_AMBULATORY_CARE_PROVIDER_SITE_OTHER): Payer: 59 | Admitting: Nurse Practitioner

## 2018-10-28 ENCOUNTER — Other Ambulatory Visit: Payer: Self-pay

## 2018-10-28 VITALS — BP 114/72 | Ht 67.0 in | Wt 247.0 lb

## 2018-10-28 DIAGNOSIS — J4520 Mild intermittent asthma, uncomplicated: Secondary | ICD-10-CM | POA: Diagnosis not present

## 2018-10-28 DIAGNOSIS — R7303 Prediabetes: Secondary | ICD-10-CM

## 2018-10-28 DIAGNOSIS — E161 Other hypoglycemia: Secondary | ICD-10-CM

## 2018-10-28 MED ORDER — METFORMIN HCL ER 500 MG PO TB24: 1000.0000 mg | ORAL_TABLET | Freq: Every day | ORAL | 1 refills | Status: DC

## 2018-10-28 MED ORDER — VICTOZA 18 MG/3ML ~~LOC~~ SOPN: PEN_INJECTOR | SUBCUTANEOUS | 1 refills | Status: DC

## 2018-10-28 MED ORDER — FREESTYLE LIBRE 14 DAY SENSOR MISC: 18.0000 mg | 11 refills | Status: DC

## 2018-10-28 MED ORDER — PANTOPRAZOLE SODIUM 40 MG PO TBEC: 40.0000 mg | DELAYED_RELEASE_TABLET | Freq: Every day | ORAL | 1 refills | Status: DC

## 2018-10-28 MED ORDER — HYDROCHLOROTHIAZIDE 25 MG PO TABS: 25.0000 mg | ORAL_TABLET | Freq: Every day | ORAL | 1 refills | Status: DC

## 2018-10-28 MED ORDER — PEN NEEDLES 32G X 5 MM MISC: 1.8000 mg | Freq: Every day | 3 refills | Status: DC

## 2018-10-28 MED ORDER — FLUTICASONE-SALMETEROL 250-50 MCG/DOSE IN AEPB: 1.0000 | INHALATION_SPRAY | Freq: Two times a day (BID) | RESPIRATORY_TRACT | 5 refills | Status: DC

## 2018-10-28 MED ORDER — ALBUTEROL SULFATE HFA 108 (90 BASE) MCG/ACT IN AERS: 2.0000 | INHALATION_SPRAY | RESPIRATORY_TRACT | 5 refills | Status: DC | PRN

## 2018-10-28 MED ORDER — DILTIAZEM HCL ER COATED BEADS 240 MG PO CP24: 240.0000 mg | ORAL_CAPSULE | Freq: Every day | ORAL | 1 refills | Status: DC

## 2018-10-28 MED ORDER — FREESTYLE LIBRE 14 DAY READER DEVI: 18.0000 mg | 0 refills | Status: DC

## 2018-10-28 MED FILL — CARTIA XT 240 MG CAPSULE SA: 240 | 90 days supply | Qty: 90 | Fill #0

## 2018-10-28 MED FILL — ADVAIR 250/50 DISKUS: 250-50 | 90 days supply | Qty: 180 | Fill #0

## 2018-10-28 MED FILL — ALBUTEROL SULFATE HFA 108 (: 108 (90 BAS | 16 days supply | Qty: 18 | Fill #0

## 2018-10-29 ENCOUNTER — Telehealth: Payer: Self-pay | Admitting: Family Medicine

## 2018-10-29 ENCOUNTER — Telehealth (INDEPENDENT_AMBULATORY_CARE_PROVIDER_SITE_OTHER): Payer: 59 | Admitting: Family Medicine

## 2018-10-29 ENCOUNTER — Encounter: Payer: Self-pay | Admitting: Nurse Practitioner

## 2018-10-29 ENCOUNTER — Other Ambulatory Visit: Payer: Self-pay | Admitting: Family Medicine

## 2018-10-29 DIAGNOSIS — I10 Essential (primary) hypertension: Secondary | ICD-10-CM

## 2018-10-29 DIAGNOSIS — Z6838 Body mass index (BMI) 38.0-38.9, adult: Secondary | ICD-10-CM

## 2018-10-29 DIAGNOSIS — L237 Allergic contact dermatitis due to plants, except food: Secondary | ICD-10-CM | POA: Diagnosis not present

## 2018-10-29 MED ORDER — TRIAMCINOLONE ACETONIDE 0.1 % EX CREA: 1.0000 "application " | TOPICAL_CREAM | Freq: Two times a day (BID) | CUTANEOUS | 4 refills | Status: DC

## 2018-10-29 MED ORDER — PREDNISONE 20 MG PO TABS: ORAL_TABLET | ORAL | 0 refills | Status: DC

## 2018-10-29 NOTE — Telephone Encounter (Signed)
Called & submitted Rx prior auth for [facility] sensor & reader - 24-72 hours before we'll get decision  Ref# [id number] - sensor Ref# [id number] - reader  Per Shawna with [employer] [phone]

## 2018-10-29 NOTE — Telephone Encounter (Signed)
Has poison oak on left side after pulling weeds over the weekend. RX to [facility].

## 2018-10-29 NOTE — Telephone Encounter (Signed)
Pt.notified

## 2018-10-29 NOTE — Telephone Encounter (Signed)
I will send in prednisone taper along with triamcinolone cream if ongoing trouble notify us we will send this to [place]

## 2018-10-29 NOTE — Progress Notes (Signed)
pred 

## 2018-10-29 NOTE — Progress Notes (Signed)
Subjective: Presents for follow-up on her chronic health issues.  Had labs done in March through her health program at [facility].  Has been discussing possibility of a hysterectomy with her gynecologist.  Had genetic testing looking for risk associated with ovarian cancer but found that she has increased risk for breast cancer and will be getting regular follow-up.  Gets regular preventive health physicals.  Reflux stable.  Rare use of albuterol.  Limited exercise.  Has done better with her diet but has had minimal weight loss.  Compliant with medications.  Objective:   Ht 5\' 7"  (1.702 m)   Wt 247 lb (112 kg)   BMI 38.69 kg/m  NAD.  Alert, oriented.  Lungs clear.  Heart regular rate and rhythm.  BP right arm sitting 114/72. See labs dated 07/14/18.    Assessment:   Problem List Items Addressed This Visit      Digestive   Hyperinsulinemia - Primary     Other   Morbid obesity ([place])   Relevant Medications   liraglutide (VICTOZA) 18 MG/3ML SOPN   metFORMIN (GLUCOPHAGE-XR) 500 MG 24 hr tablet   Prediabetes    Other Visit Diagnoses    Mild intermittent asthma without complication       Relevant Medications   albuterol (VENTOLIN HFA) 108 (90 Base) MCG/ACT inhaler   Fluticasone-Salmeterol (ADVAIR DISKUS) 250-50 MCG/DOSE AEPB       Plan:   Meds ordered this encounter  Medications  . diltiazem (CARDIZEM CD) 240 MG 24 hr capsule    Sig: Take 1 capsule (240 mg total) by mouth daily.    Dispense:  90 capsule    Refill:  1    Order Specific Question:   Supervising Provider    Answer:   Sallee Lange A [[id number]]  . albuterol (VENTOLIN HFA) 108 (90 Base) MCG/ACT inhaler    Sig: Inhale 2 puffs into the lungs every 4 (four) hours as needed for wheezing or shortness of breath.    Dispense:  18 g    Refill:  5    Order Specific Question:   Supervising Provider    Answer:   Sallee Lange A [[id number]]  . Fluticasone-Salmeterol (ADVAIR DISKUS) 250-50 MCG/DOSE AEPB    Sig: Inhale 1 puff into the lungs 2  (two) times daily.    Dispense:  60 each    Refill:  5    Please dispense 90 day supply    Order Specific Question:   Supervising Provider    Answer:   Sallee Lange A [[id number]]  . hydrochlorothiazide (HYDRODIURIL) 25 MG tablet    Sig: Take 1 tablet (25 mg total) by mouth daily.    Dispense:  90 tablet    Refill:  1    Order Specific Question:   Supervising Provider    Answer:   Sallee Lange A [[id number]]  . Insulin Pen Needle (PEN NEEDLES) 32G X 5 MM MISC    Sig: Inject 1.8 mg into the skin daily.    Dispense:  100 each    Refill:  3    Order Specific Question:   Supervising Provider    Answer:   Sallee Lange A [[id number]]  . liraglutide (VICTOZA) 18 MG/3ML SOPN    Sig: Take  1.8 mg subcu qd    Dispense:  9 pen    Refill:  1    Please dispense 90 day supply    Order Specific Question:   Supervising Provider    Answer:   Kathyrn Drown [  [id number]]  . metFORMIN (GLUCOPHAGE-XR) 500 MG 24 hr tablet    Sig: Take 2 tablets (1,000 mg total) by mouth daily with breakfast.    Dispense:  180 tablet    Refill:  1    Order Specific Question:   Supervising Provider    Answer:   Lilyan PuntLUKING, SCOTT A [[id number]]  . pantoprazole (PROTONIX) 40 MG tablet    Sig: Take 1 tablet (40 mg total) by mouth daily.    Dispense:  90 tablet    Refill:  1    Order Specific Question:   Supervising Provider    Answer:   Lilyan PuntLUKING, SCOTT A [[id number]]  . Continuous Blood Gluc Sensor (FREESTYLE LIBRE 14 DAY SENSOR) MISC    Sig: 18 mg by Does not apply route continuous.    Dispense:  2 each    Refill:  11    Not sure what to put for dose for this type of device; Diagnoses: E16.1; E28.2; R73.03    Order Specific Question:   Supervising Provider    Answer:   Lilyan PuntLUKING, SCOTT A [[id number]]  . Continuous Blood Gluc Receiver (FREESTYLE LIBRE 14 DAY READER) DEVI    Sig: 18 mg by Does not apply route continuous.    Dispense:  1 Device    Refill:  0    Please see sensor script for diagnoses.    Order Specific Question:   Supervising Provider     Answer:   Lilyan PuntLUKING, SCOTT A [[id number]]   Continue current medications as directed.  Per patient request will order a freestyle libre for her to check her blood sugars at least once a day.  Continue follow-up with her health program through work.  Continue follow-up with her gynecologist for her physicals.   Return in about 6 months (around 04/29/2019) for Recheck.

## 2018-10-30 ENCOUNTER — Telehealth: Payer: Self-pay | Admitting: Nurse Practitioner

## 2018-10-30 ENCOUNTER — Ambulatory Visit (INDEPENDENT_AMBULATORY_CARE_PROVIDER_SITE_OTHER): Payer: 59 | Admitting: *Deleted

## 2018-10-30 DIAGNOSIS — L237 Allergic contact dermatitis due to plants, except food: Secondary | ICD-10-CM | POA: Diagnosis not present

## 2018-10-30 DIAGNOSIS — R21 Rash and other nonspecific skin eruption: Secondary | ICD-10-CM

## 2018-10-30 NOTE — Telephone Encounter (Signed)
Needs a shot for poison oak

## 2018-10-30 NOTE — Telephone Encounter (Signed)
Please give Depo Medrol 40 mg IM. Continue Prednisone as directed. Call if no improvement.

## 2018-10-30 NOTE — Telephone Encounter (Signed)
done

## 2018-11-02 ENCOUNTER — Other Ambulatory Visit: Payer: Self-pay

## 2018-11-02 DIAGNOSIS — R21 Rash and other nonspecific skin eruption: Secondary | ICD-10-CM | POA: Diagnosis not present

## 2018-11-02 DIAGNOSIS — L237 Allergic contact dermatitis due to plants, except food: Secondary | ICD-10-CM | POA: Diagnosis not present

## 2018-11-02 MED ORDER — METHYLPREDNISOLONE ACETATE 40 MG/ML IJ SUSP
40.0000 mg | Freq: Once | INTRAMUSCULAR | Status: AC
  Administered 2018-11-02: 40 mg via INTRAMUSCULAR

## 2018-11-02 NOTE — Progress Notes (Signed)
Office: [phone]  /  Fax: [phone] TeleHealth Visit:  Christina Rodriguez has verbally consented to this TeleHealth visit today. The patient is located at home, the provider is located at the [facility] office. The participants in this visit include the listed provider and patient. The visit was conducted today via face time.  HPI:   Chief Complaint: OBESITY Christina Rodriguez is here to discuss her progress with her obesity treatment plan. She is on the keep a food journal with 1500 calories and 90+ grams of protein daily and is following her eating plan approximately 90 % of the time. She states she is walking for 30 minutes 3-4 times per week, and swimming for 60 minutes 5-7 times per week. Christina Rodriguez's weight is of 247 lbs today (1 lb weight gain since 5/28). She is working on trying to decrease carbohydrates and get more protein in for the day. She notes hunger in the morning. She got poison oak while pulling weeds in the yard.  We were unable to weigh the patient today for this TeleHealth visit. She feels as if she has gained 1 lb since her last visit. She has lost 14 lbs since starting treatment with [place].  Hypertension Christina Rodriguez is a 43 y.o. female with hypertension. Christina Rodriguez's blood pressure was controlled previously. Her blood pressure is 114/74 today. She denies chest pain, chest pressure, or headaches. She is working on weight loss to help control her blood pressure with the goal of decreasing her risk of heart attack and stroke.   Poison Oak Christina Rodriguez has poison oak. She was prescribed prednisone and triamcinolone by Dr. Wolfgang Phoenix.  ASSESSMENT AND PLAN:  Essential hypertension  Poison oak  Class 2 severe obesity with serious comorbidity and body mass index (BMI) of 38.0 to 38.9 in adult, unspecified obesity type ([place])  PLAN:  Hypertension We discussed sodium restriction, working on healthy weight loss, and a regular exercise program as the means to achieve improved blood  pressure control. Christina Rodriguez agreed with this plan and agreed to follow up as directed. We will continue to monitor her blood pressure as well as her progress with the above lifestyle modifications. Christina Rodriguez agrees to continue her current medications and will watch for signs of hypotension as she continues her lifestyle modifications. Christina Rodriguez agrees to follow up with our clinic in 3 weeks.  Poison Christina Rodriguez We will follow up on symptoms and resolutions at her next visit. Christina Rodriguez agrees to follow up with our clinic in 3 weeks.  Obesity Christina Rodriguez is currently in the action stage of change. As such, her goal is to continue with weight loss efforts She has agreed to keep a food journal with 1500 calories and 90+ grams of protein daily Christina Rodriguez has been instructed to work up to a goal of 150 minutes of combined cardio and strengthening exercise per week for weight loss and overall health benefits. We discussed the following Behavioral Modification Strategies today: increasing lean protein intake, increasing vegetables and work on meal planning and easy cooking plans, keeping healthy foods in the home, and planning for success   Christina Rodriguez has agreed to follow up with our clinic in 3 weeks. She was informed of the importance of frequent follow up visits to maximize her success with intensive lifestyle modifications for her multiple health conditions.  ALLERGIES: Allergies  Allergen Reactions  . Beta Adrenergic Blockers Shortness Of Breath  . Shrimp [Shellfish Allergy] Hives  . Soybeans Hives    MEDICATIONS: Current Outpatient Medications on File  Prior to Visit  Medication Sig Dispense Refill  . albuterol (VENTOLIN HFA) 108 (90 Base) MCG/ACT inhaler Inhale 2 puffs into the lungs every 4 (four) hours as needed for wheezing or shortness of breath. 18 g 5  . Continuous Blood Gluc Receiver (FREESTYLE LIBRE 14 DAY READER) DEVI 18 mg by Does not apply route continuous. 1 Device 0  . Continuous Blood Gluc Sensor (FREESTYLE  LIBRE 14 DAY SENSOR) MISC 18 mg by Does not apply route continuous. 2 each 11  . cyanocobalamin 500 MCG tablet Take 500 mcg by mouth daily.    Marland Kitchen. diltiazem (CARDIZEM CD) 240 MG 24 hr capsule Take 1 capsule (240 mg total) by mouth daily. 90 capsule 1  . EPINEPHrine 0.3 mg/0.3 mL IJ SOAJ injection Inject 0.3 mg into the muscle once. Reported on 05/03/2015    . fluconazole (DIFLUCAN) 150 MG tablet Take one tablet by mouth three days apart 2 tablet 0  . fluconazole (DIFLUCAN) 150 MG tablet Take one tablet by mouth three days apart 2 tablet 0  . Fluticasone-Salmeterol (ADVAIR DISKUS) 250-50 MCG/DOSE AEPB Inhale 1 puff into the lungs 2 (two) times daily. 60 each 5  . hydrochlorothiazide (HYDRODIURIL) 25 MG tablet Take 1 tablet (25 mg total) by mouth daily. 90 tablet 1  . ibuprofen (ADVIL,MOTRIN) 200 MG tablet Take 400 mg by mouth every 6 (six) hours as needed for headache, mild pain or moderate pain.    . Insulin Pen Needle (PEN NEEDLES) 32G X 5 MM MISC Inject 1.8 mg into the skin daily. 100 each 3  . liraglutide (VICTOZA) 18 MG/3ML SOPN Take  1.8 mg subcu qd 9 pen 1  . metFORMIN (GLUCOPHAGE-XR) 500 MG 24 hr tablet Take 2 tablets (1,000 mg total) by mouth daily with breakfast. 180 tablet 1  . Multiple Vitamins-Minerals (MULTIVITAMIN ADULT PO) Take 1 tablet by mouth daily.    . pantoprazole (PROTONIX) 40 MG tablet Take 1 tablet (40 mg total) by mouth daily. 90 tablet 1  . rizatriptan (MAXALT-MLT) 10 MG disintegrating tablet Take 1 tablet (10 mg total) by mouth as needed for migraine. May repeat in 2 hours if needed; max 2 per 24 hours 27 tablet 1  . tiZANidine (ZANAFLEX) 4 MG capsule Take one capsule by mouth three times daily 30 capsule 0  . Vitamin D, Ergocalciferol, (DRISDOL) 1.25 MG (50000 UT) CAPS capsule Take 1 capsule (50,000 Units total) by mouth every 3 (three) days. 10 capsule 0   No current facility-administered medications on file prior to visit.     PAST MEDICAL HISTORY: Past Medical  History:  Diagnosis Date  . Adenomyosis   . Anemia    HIstory of   . Asthma    albuterol inhaler prn-recent uri-using more freq  . Dysrhythmia    history of svt-controlled on cardizem-diagnosed in 1999-  . Gall bladder disease   . GERD (gastroesophageal reflux disease)    uses protonix  . Headache    migraines   . History of polycystic ovarian disease   . Infertility associated with anovulation   . Metabolic syndrome   . Multiple food allergies    shrimp  . Osteoarthritis   . Palpitations   . PCOS (polycystic ovarian syndrome)   . PONV (postoperative nausea and vomiting)   . Prediabetes   . Recurrent upper respiratory infection (URI)    resolving  . SVT (supraventricular tachycardia) (HCC)    History of   . Vitamin D deficiency   . VSD (ventricular septal defect)  history of -no problems    PAST SURGICAL HISTORY: Past Surgical History:  Procedure Laterality Date  . CESAREAN SECTION  2010  . CESAREAN SECTION  11/16/2010   Procedure: CESAREAN SECTION;  Surgeon: Almon HerculesKendra H. Ross;  Location: [facility];  Service: Gynecology;  Laterality: N/A;  Repeat   . CHOLECYSTECTOMY  2003  . DILATION AND CURETTAGE OF UTERUS    . HYSTEROSCOPY W/D&C N/A 12/06/2014   Procedure: DILATATION AND CURETTAGE /HYSTEROSCOPY;  Surgeon: [name], MD;  Location: [facility];  Service: Gynecology;  Laterality: N/A;  . uterine polyps      SOCIAL HISTORY: Social History   Tobacco Use  . Smoking status: Never Smoker  . Smokeless tobacco: Never Used  Substance Use Topics  . Alcohol use: No  . Drug use: No    FAMILY HISTORY: Family History  Problem Relation Age of Onset  . Diabetes Father   . Hypertension Father   . Hyperlipidemia Father   . Sleep apnea Father   . Obesity Father     ROS: Review of Systems  Constitutional: Negative for weight loss.  Cardiovascular: Negative for chest pain.       Negative chest pressure  Neurological: Negative for headaches.    PHYSICAL EXAM: Pt in no  acute distress  RECENT LABS AND TESTS: BMET    Component Value Date/Time   NA 140 02/18/2018 0853   K 4.1 02/18/2018 0853   CL 102 02/18/2018 0853   CO2 22 02/18/2018 0853   GLUCOSE 77 02/18/2018 0853   GLUCOSE 127 (H) 11/16/2014 1625   BUN 15 02/18/2018 0853   CREATININE 0.67 02/18/2018 0853   CREATININE 0.62 01/20/2014 1317   CALCIUM 9.2 02/18/2018 0853   GFRNONAA 110 02/18/2018 0853   GFRAA 126 02/18/2018 0853   Lab Results  Component Value Date   HGBA1C 5.4 07/14/2018   HGBA1C 5.2 02/18/2018   HGBA1C 5.4 09/10/2017   HGBA1C 5.5 04/23/2017   HGBA1C 5.3 12/26/2016   Lab Results  Component Value Date   INSULIN 34.1 (H) 07/14/2018   INSULIN 16.7 02/18/2018   INSULIN 24.7 09/10/2017   INSULIN 24.6 04/23/2017   INSULIN 18.1 12/26/2016   CBC    Component Value Date/Time   WBC 6.7 12/26/2016 0957   WBC 8.9 11/16/2014 1625   RBC 4.64 12/26/2016 0957   RBC 4.34 11/16/2014 1625   HGB 12.9 12/26/2016 0957   HCT 39.7 12/26/2016 0957   PLT 389 (H) 01/26/2015 0823   MCV 86 12/26/2016 0957   MCH 27.8 12/26/2016 0957   MCH 27.0 11/16/2014 1625   MCHC 32.5 12/26/2016 0957   MCHC 31.8 11/16/2014 1625   RDW 15.2 12/26/2016 0957   LYMPHSABS 1.7 12/26/2016 0957   EOSABS 0.1 12/26/2016 0957   BASOSABS 0.0 12/26/2016 0957   Iron/TIBC/Ferritin/ %Sat    Component Value Date/Time   FERRITIN 21 02/22/2016 0823   Lipid Panel     Component Value Date/Time   CHOL 163 07/14/2018 1421   TRIG 81 07/14/2018 1421   HDL 36 (L) 07/14/2018 1421   CHOLHDL 3.6 02/22/2016 0823   CHOLHDL 4.6 01/20/2014 1317   VLDL 22 01/20/2014 1317   LDLCALC 111 (H) 07/14/2018 1421   Hepatic Function Panel     Component Value Date/Time   PROT 6.8 02/18/2018 0853   ALBUMIN 4.0 02/18/2018 0853   AST 15 02/18/2018 0853   ALT 17 02/18/2018 0853   ALKPHOS 59 02/18/2018 0853   BILITOT 0.4 02/18/2018 0853   BILIDIR  0.09 02/22/2016 0823   IBILI 0.2 01/20/2014 1317      Component Value Date/Time    TSH 2.220 12/26/2016 0957   TSH 3.130 02/22/2016 0823   TSH 2.880 01/26/2015 0823      I, Burt KnackSharon Martin, am acting as transcriptionist for Debbra RidingAlexandria Kadolph, MD  I have reviewed the above documentation for accuracy and completeness, and I agree with the above. - Debbra RidingAlexandria Kadolph, MD

## 2018-11-09 DIAGNOSIS — N941 Unspecified dyspareunia: Secondary | ICD-10-CM | POA: Diagnosis not present

## 2018-11-09 DIAGNOSIS — R102 Pelvic and perineal pain: Secondary | ICD-10-CM | POA: Diagnosis not present

## 2018-11-09 NOTE — Telephone Encounter (Signed)
Was denied due to criteria not met, pt aware

## 2018-11-17 ENCOUNTER — Other Ambulatory Visit: Payer: Self-pay

## 2018-11-17 ENCOUNTER — Ambulatory Visit (INDEPENDENT_AMBULATORY_CARE_PROVIDER_SITE_OTHER): Payer: 59 | Admitting: Family Medicine

## 2018-11-17 ENCOUNTER — Encounter (INDEPENDENT_AMBULATORY_CARE_PROVIDER_SITE_OTHER): Payer: Self-pay | Admitting: Family Medicine

## 2018-11-17 VITALS — BP 111/75 | HR 85 | Temp 98.1°F | Ht 67.0 in | Wt 252.0 lb

## 2018-11-17 DIAGNOSIS — D649 Anemia, unspecified: Secondary | ICD-10-CM | POA: Diagnosis not present

## 2018-11-17 DIAGNOSIS — Z6839 Body mass index (BMI) 39.0-39.9, adult: Secondary | ICD-10-CM

## 2018-11-17 DIAGNOSIS — R7303 Prediabetes: Secondary | ICD-10-CM | POA: Diagnosis not present

## 2018-11-17 DIAGNOSIS — Z9189 Other specified personal risk factors, not elsewhere classified: Secondary | ICD-10-CM

## 2018-11-17 DIAGNOSIS — E559 Vitamin D deficiency, unspecified: Secondary | ICD-10-CM | POA: Diagnosis not present

## 2018-11-17 DIAGNOSIS — E7849 Other hyperlipidemia: Secondary | ICD-10-CM

## 2018-11-18 LAB — HEMOGLOBIN A1C
Est. average glucose Bld gHb Est-mCnc: 111 mg/dL
Hgb A1c MFr Bld: 5.5 % (ref 4.8–5.6)

## 2018-11-18 LAB — COMPREHENSIVE METABOLIC PANEL
ALT: 17 IU/L (ref 0–32)
AST: 18 IU/L (ref 0–40)
Albumin/Globulin Ratio: 1.4 (ref 1.2–2.2)
Albumin: 4.1 g/dL (ref 3.8–4.8)
Alkaline Phosphatase: 63 IU/L (ref 39–117)
BUN/Creatinine Ratio: 29 — ABNORMAL HIGH (ref 9–23)
BUN: 19 mg/dL (ref 6–24)
Bilirubin Total: 0.4 mg/dL (ref 0.0–1.2)
CO2: 22 mmol/L (ref 20–29)
Calcium: 9.3 mg/dL (ref 8.7–10.2)
Chloride: 102 mmol/L (ref 96–106)
Creatinine, Ser: 0.65 mg/dL (ref 0.57–1.00)
GFR calc Af Amer: 127 mL/min/{1.73_m2} (ref >59)
GFR calc non Af Amer: 110 mL/min/{1.73_m2} (ref >59)
Globulin, Total: 3 g/dL (ref 1.5–4.5)
Glucose: 84 mg/dL (ref 65–99)
Potassium: 4.4 mmol/L (ref 3.5–5.2)
Sodium: 140 mmol/L (ref 134–144)
Total Protein: 7.1 g/dL (ref 6.0–8.5)

## 2018-11-18 LAB — CBC WITH DIFFERENTIAL/PLATELET
Basophils Absolute: 0 10*3/uL (ref 0.0–0.2)
Basos: 1 %
EOS (ABSOLUTE): 0.2 10*3/uL (ref 0.0–0.4)
Eos: 2 %
Hematocrit: 40.6 % (ref 34.0–46.6)
Hemoglobin: 13.1 g/dL (ref 11.1–15.9)
Immature Grans (Abs): 0 10*3/uL (ref 0.0–0.1)
Immature Granulocytes: 0 %
Lymphocytes Absolute: 1.9 10*3/uL (ref 0.7–3.1)
Lymphs: 25 %
MCH: 28.3 pg (ref 26.6–33.0)
MCHC: 32.3 g/dL (ref 31.5–35.7)
MCV: 88 fL (ref 79–97)
Monocytes Absolute: 0.6 10*3/uL (ref 0.1–0.9)
Monocytes: 8 %
Neutrophils Absolute: 4.7 10*3/uL (ref 1.4–7.0)
Neutrophils: 64 %
Platelets: 340 10*3/uL (ref 150–450)
RBC: 4.63 x10E6/uL (ref 3.77–5.28)
RDW: 14.3 % (ref 11.7–15.4)
WBC: 7.4 10*3/uL (ref 3.4–10.8)

## 2018-11-18 LAB — LIPID PANEL WITH LDL/HDL RATIO
Cholesterol, Total: 174 mg/dL (ref 100–199)
HDL: 40 mg/dL (ref >39)
LDL Calculated: 118 mg/dL — ABNORMAL HIGH (ref 0–99)
LDl/HDL Ratio: 3 ratio (ref 0.0–3.2)
Triglycerides: 78 mg/dL (ref 0–149)
VLDL Cholesterol Cal: 16 mg/dL (ref 5–40)

## 2018-11-18 LAB — INSULIN, RANDOM: INSULIN: 24.5 u[IU]/mL (ref 2.6–24.9)

## 2018-11-18 LAB — VITAMIN D 25 HYDROXY (VIT D DEFICIENCY, FRACTURES): Vit D, 25-Hydroxy: 34.6 ng/mL (ref 30.0–100.0)

## 2018-11-18 NOTE — Progress Notes (Signed)
Office: [phone]  /  Fax: [phone]   HPI:   Chief Complaint: OBESITY Christina Rodriguez is here to discuss her progress with her obesity treatment plan. She is on the keep a food journal with 1500 calories and 90+ grams of protein daily and is following her eating plan approximately 90 % of the time. She states she is walking for 30 minutes 3-4 times per week. Christina Rodriguez required 9 days of prednisone for poison oak during the past few weeks. She stopped prednisone about 5 days ago. She did notice 12 lb weight gain, but has lost 6 lbs.  Her weight is 252 lb (114.3 kg) today and has gained 6 lbs since her last visit. She has lost 8 lbs since starting treatment with [place].  Pre-Diabetes Christina Rodriguez has a diagnosis of pre-diabetes based on her elevated Hgb A1c and was informed this puts her at greater risk of developing diabetes. She is on metformin and Victoza and denies GI side effects of both. She continues to work on diet and exercise to decrease risk of diabetes. She denies hypoglycemia.  Vitamin D Deficiency Christina Rodriguez has a diagnosis of vitamin D deficiency. She is currently taking prescription Vit D. Last Vit D level was of 26.1. She notes fatigue and denies nausea, vomiting or muscle weakness.  At risk for osteopenia and osteoporosis Christina Rodriguez is at higher risk of osteopenia and osteoporosis due to vitamin D deficiency.   Hyperlipidemia Christina Rodriguez has hyperlipidemia and has been trying to improve her cholesterol levels with intensive lifestyle modification including a low saturated fat diet, exercise and weight loss. Last LDL was of 111 and she is not on statin. She denies any chest pain, claudication or myalgias.  Normocytic Anemia Christina Rodriguez has a diagnosis of anemia. She has a history of anemia secondary to menstruation. She is not on iron supplementation.   ASSESSMENT AND PLAN:  Prediabetes - Plan: Hemoglobin A1c, Insulin, random, Comprehensive metabolic panel  Vitamin D deficiency - Plan: VITAMIN D 25  Hydroxy (Vit-D Deficiency, Fractures), Vitamin B12  Other hyperlipidemia - Plan: Lipid Panel With LDL/HDL Ratio  Normocytic anemia - Plan: CBC w/Diff/Platelet, Vitamin B12  At risk for osteoporosis  Class 2 severe obesity with serious comorbidity and body mass index (BMI) of 39.0 to 39.9 in adult, unspecified obesity type [facility])  PLAN:  Pre-Diabetes Christina Rodriguez will continue to work on weight loss, exercise, and decreasing simple carbohydrates in her diet to help decrease the risk of diabetes. We dicussed metformin including benefits and risks. She was informed that eating too many simple carbohydrates or too many calories at one sitting increases the likelihood of GI side effects. Christina Rodriguez agrees to continue her medications, and we will check Hgb A1c, insulin, and Vit B12 level today. Christina Rodriguez agrees to follow up with our clinic in 3 weeks as directed to monitor her progress.  Vitamin D Deficiency Christina Rodriguez was informed that low vitamin D levels contributes to fatigue and are associated with obesity, breast, and colon cancer. Christina Rodriguez agrees to continue taking prescription Vit D 50,000 IU every 3 days and will follow up for routine testing of vitamin D, at least 2-3 times per year. She was informed of the risk of over-replacement of vitamin D and agrees to not increase her dose unless she discusses this with [place] first. We will check Vit D level today. Christina Rodriguez agrees to follow up with our clinic in 3 weeks.  At risk for osteopenia and osteoporosis Christina Rodriguez was given extended (15 minutes) osteoporosis prevention counseling today. Christina Rodriguez is  at risk for osteopenia and osteoporsis due to her vitamin D deficiency. She was encouraged to take her vitamin D and follow her higher calcium diet and increase strengthening exercise to help strengthen her bones and decrease her risk of osteopenia and osteoporosis.  Hyperlipidemia Christina Rodriguez was informed of the American Heart Association Guidelines emphasizing intensive lifestyle  modifications as the first line treatment for hyperlipidemia. We discussed many lifestyle modifications today in depth, and Christina Rodriguez will continue to work on decreasing saturated fats such as fatty red meat, butter and many fried foods. She will also increase vegetables and lean protein in her diet and continue to work on exercise and weight loss efforts. We will check FLP today. Christina Rodriguez agrees to follow up with our clinic in 3 weeks.  Normocytic Anemia The diagnosis of normocytic anemia was discussed with Christina Rodriguez and was explained in detail. She was given suggestions of iron rich foods and iron supplement was not prescribed. We will check CBC today. Christina Rodriguez agrees to follow up with our clinic in 3 weeks.  Obesity Christina Rodriguez is currently in the action stage of change. As such, her goal is to continue with weight loss efforts She has agreed to keep a food journal with 1500 calories and 90+ grams of protein daily Christina Rodriguez has been instructed to work up to a goal of 150 minutes of combined cardio and strengthening exercise per week or resistance training for 15-30 minutes 3 times per week for weight loss and overall health benefits. We discussed the following Behavioral Modification Strategies today: increasing lean protein intake, increasing vegetables and work on meal planning and easy cooking plans, keeping healthy foods in the home, and planning for success   Christina Rodriguez has agreed to follow up with our clinic in 3 weeks. She was informed of the importance of frequent follow up visits to maximize her success with intensive lifestyle modifications for her multiple health conditions.  ALLERGIES: Allergies  Allergen Reactions  . Beta Adrenergic Blockers Shortness Of Breath  . Shrimp [Shellfish Allergy] Hives  . Soybeans Hives    MEDICATIONS: Current Outpatient Medications on File Prior to Visit  Medication Sig Dispense Refill  . albuterol (VENTOLIN HFA) 108 (90 Base) MCG/ACT inhaler Inhale 2 puffs into the  lungs every 4 (four) hours as needed for wheezing or shortness of breath. 18 g 5  . Continuous Blood Gluc Receiver (FREESTYLE LIBRE 14 DAY READER) DEVI 18 mg by Does not apply route continuous. 1 Device 0  . Continuous Blood Gluc Sensor (FREESTYLE LIBRE 14 DAY SENSOR) MISC 18 mg by Does not apply route continuous. 2 each 11  . cyanocobalamin 500 MCG tablet Take 500 mcg by mouth daily.    Marland Kitchen. diltiazem (CARDIZEM CD) 240 MG 24 hr capsule Take 1 capsule (240 mg total) by mouth daily. 90 capsule 1  . EPINEPHrine 0.3 mg/0.3 mL IJ SOAJ injection Inject 0.3 mg into the muscle once. Reported on 05/03/2015    . fluconazole (DIFLUCAN) 150 MG tablet Take one tablet by mouth three days apart 2 tablet 0  . fluconazole (DIFLUCAN) 150 MG tablet Take one tablet by mouth three days apart 2 tablet 0  . Fluticasone-Salmeterol (ADVAIR DISKUS) 250-50 MCG/DOSE AEPB Inhale 1 puff into the lungs 2 (two) times daily. 60 each 5  . hydrochlorothiazide (HYDRODIURIL) 25 MG tablet Take 1 tablet (25 mg total) by mouth daily. 90 tablet 1  . ibuprofen (ADVIL,MOTRIN) 200 MG tablet Take 400 mg by mouth every 6 (six) hours as needed for headache, mild pain  or moderate pain.    . Insulin Pen Needle (PEN NEEDLES) 32G X 5 MM MISC Inject 1.8 mg into the skin daily. 100 each 3  . liraglutide (VICTOZA) 18 MG/3ML SOPN Take  1.8 mg subcu qd 9 pen 1  . metFORMIN (GLUCOPHAGE-XR) 500 MG 24 hr tablet Take 2 tablets (1,000 mg total) by mouth daily with breakfast. 180 tablet 1  . Multiple Vitamins-Minerals (MULTIVITAMIN ADULT PO) Take 1 tablet by mouth daily.    . pantoprazole (PROTONIX) 40 MG tablet Take 1 tablet (40 mg total) by mouth daily. 90 tablet 1  . predniSONE (DELTASONE) 20 MG tablet 3qd for 3d then 2qd for 3d then 1qd for 3d 18 tablet 0  . rizatriptan (MAXALT-MLT) 10 MG disintegrating tablet Take 1 tablet (10 mg total) by mouth as needed for migraine. May repeat in 2 hours if needed; max 2 per 24 hours 27 tablet 1  . tiZANidine (ZANAFLEX)  4 MG capsule Take one capsule by mouth three times daily 30 capsule 0  . triamcinolone cream (KENALOG) 0.1 % Apply 1 application topically 2 (two) times daily. 45 g 4  . Vitamin D, Ergocalciferol, (DRISDOL) 1.25 MG (50000 UT) CAPS capsule Take 1 capsule (50,000 Units total) by mouth every 3 (three) days. 10 capsule 0   No current facility-administered medications on file prior to visit.     PAST MEDICAL HISTORY: Past Medical History:  Diagnosis Date  . Adenomyosis   . Anemia    HIstory of   . Asthma    albuterol inhaler prn-recent uri-using more freq  . Dysrhythmia    history of svt-controlled on cardizem-diagnosed in 1999-  . Gall bladder disease   . GERD (gastroesophageal reflux disease)    uses protonix  . Headache    migraines   . History of polycystic ovarian disease   . Infertility associated with anovulation   . Metabolic syndrome   . Multiple food allergies    shrimp  . Osteoarthritis   . Palpitations   . PCOS (polycystic ovarian syndrome)   . PONV (postoperative nausea and vomiting)   . Prediabetes   . Recurrent upper respiratory infection (URI)    resolving  . SVT (supraventricular tachycardia) (HCC)    History of   . Vitamin D deficiency   . VSD (ventricular septal defect)    history of -no problems    PAST SURGICAL HISTORY: Past Surgical History:  Procedure Laterality Date  . CESAREAN SECTION  2010  . CESAREAN SECTION  11/16/2010   Procedure: CESAREAN SECTION;  Surgeon: Almon HerculesKendra H. Ross;  Location: [facility];  Service: Gynecology;  Laterality: N/A;  Repeat   . CHOLECYSTECTOMY  2003  . DILATION AND CURETTAGE OF UTERUS    . HYSTEROSCOPY W/D&C N/A 12/06/2014   Procedure: DILATATION AND CURETTAGE /HYSTEROSCOPY;  Surgeon: [name], MD;  Location: [facility];  Service: Gynecology;  Laterality: N/A;  . uterine polyps      SOCIAL HISTORY: Social History   Tobacco Use  . Smoking status: Never Smoker  . Smokeless tobacco: Never Used  Substance Use Topics  .  Alcohol use: No  . Drug use: No    FAMILY HISTORY: Family History  Problem Relation Age of Onset  . Diabetes Father   . Hypertension Father   . Hyperlipidemia Father   . Sleep apnea Father   . Obesity Father     ROS: Review of Systems  Constitutional: Positive for malaise/fatigue. Negative for weight loss.  Cardiovascular: Negative for chest pain  and claudication.  Gastrointestinal: Negative for nausea and vomiting.  Musculoskeletal: Negative for myalgias.       Negative muscle weakness  Endo/Heme/Allergies:       Negative hypoglycemia    PHYSICAL EXAM: Blood pressure 111/75, pulse 85, temperature 98.1 F (36.7 C), temperature source Oral, height 5\' 7"  (1.702 m), weight 252 lb (114.3 kg), SpO2 98 %. Body mass index is 39.47 kg/m. Physical Exam Vitals signs reviewed.  Constitutional:      Appearance: Normal appearance. She is obese.  Cardiovascular:     Rate and Rhythm: Normal rate.     Pulses: Normal pulses.  Pulmonary:     Effort: Pulmonary effort is normal.     Breath sounds: Normal breath sounds.  Musculoskeletal: Normal range of motion.  Skin:    General: Skin is warm and dry.  Neurological:     Mental Status: She is alert and oriented to person, place, and time.  Psychiatric:        Mood and Affect: Mood normal.        Behavior: Behavior normal.     RECENT LABS AND TESTS: BMET    Component Value Date/Time   NA 140 11/17/2018 0913   K 4.4 11/17/2018 0913   CL 102 11/17/2018 0913   CO2 22 11/17/2018 0913   GLUCOSE 84 11/17/2018 0913   GLUCOSE 127 (H) 11/16/2014 1625   BUN 19 11/17/2018 0913   CREATININE 0.65 11/17/2018 0913   CREATININE 0.62 01/20/2014 1317   CALCIUM 9.3 11/17/2018 0913   GFRNONAA 110 11/17/2018 0913   GFRAA 127 11/17/2018 0913   Lab Results  Component Value Date   HGBA1C 5.5 11/17/2018   HGBA1C 5.4 07/14/2018   HGBA1C 5.2 02/18/2018   HGBA1C 5.4 09/10/2017   HGBA1C 5.5 04/23/2017   Lab Results  Component Value Date    INSULIN 24.5 11/17/2018   INSULIN 34.1 (H) 07/14/2018   INSULIN 16.7 02/18/2018   INSULIN 24.7 09/10/2017   INSULIN 24.6 04/23/2017   CBC    Component Value Date/Time   WBC 7.4 11/17/2018 0913   WBC 8.9 11/16/2014 1625   RBC 4.63 11/17/2018 0913   RBC 4.34 11/16/2014 1625   HGB 13.1 11/17/2018 0913   HCT 40.6 11/17/2018 0913   PLT 340 11/17/2018 0913   MCV 88 11/17/2018 0913   MCH 28.3 11/17/2018 0913   MCH 27.0 11/16/2014 1625   MCHC 32.3 11/17/2018 0913   MCHC 31.8 11/16/2014 1625   RDW 14.3 11/17/2018 0913   LYMPHSABS 1.9 11/17/2018 0913   EOSABS 0.2 11/17/2018 0913   BASOSABS 0.0 11/17/2018 0913   Iron/TIBC/Ferritin/ %Sat    Component Value Date/Time   FERRITIN 21 02/22/2016 0823   Lipid Panel     Component Value Date/Time   CHOL 174 11/17/2018 0913   TRIG 78 11/17/2018 0913   HDL 40 11/17/2018 0913   CHOLHDL 3.6 02/22/2016 0823   CHOLHDL 4.6 01/20/2014 1317   VLDL 22 01/20/2014 1317   LDLCALC 118 (H) 11/17/2018 0913   Hepatic Function Panel     Component Value Date/Time   PROT 7.1 11/17/2018 0913   ALBUMIN 4.1 11/17/2018 0913   AST 18 11/17/2018 0913   ALT 17 11/17/2018 0913   ALKPHOS 63 11/17/2018 0913   BILITOT 0.4 11/17/2018 0913   BILIDIR 0.09 02/22/2016 0823   IBILI 0.2 01/20/2014 1317      Component Value Date/Time   TSH 2.220 12/26/2016 0957   TSH 3.130 02/22/2016 0823   TSH 2.880  01/26/2015 0823      OBESITY BEHAVIORAL INTERVENTION VISIT  Today's visit was # 31   Starting weight: 260 lbs Starting date: 12/26/16 Today's weight : 252 lbs Today's date: 11/17/2018 Total lbs lost to date: 8    ASK: We discussed the diagnosis of obesity with Christina Rodriguez today and Christina Rodriguez agreed to give [place] permission to discuss obesity behavioral modification therapy today.  ASSESS: Christina Rodriguez has the diagnosis of obesity and her BMI today is 39.46 Christina Rodriguez is in the action stage of change   ADVISE: Christina Rodriguez was educated on the multiple health risks of  obesity as well as the benefit of weight loss to improve her health. She was advised of the need for long term treatment and the importance of lifestyle modifications to improve her current health and to decrease her risk of future health problems.  AGREE: Multiple dietary modification options and treatment options were discussed and  Christina Rodriguez agreed to follow the recommendations documented in the above note.  ARRANGE: Christina Rodriguez was educated on the importance of frequent visits to treat obesity as outlined per CMS and USPSTF guidelines and agreed to schedule her next follow up appointment today.  I, Trixie Dredge, am acting as transcriptionist for Ilene Qua, MD  I have reviewed the above documentation for accuracy and completeness, and I agree with the above. - Ilene Qua, MD

## 2018-11-23 MED FILL — HYDROCHLOROTHIAZIDE 25 MG T: 25 | 90 days supply | Qty: 90 | Fill #0

## 2018-11-23 MED FILL — METFORMIN HCL ER 500 MG TB2: 500 | 30 days supply | Qty: 60 | Fill #0

## 2018-11-23 MED FILL — VICTOZA 18 MG/3 ML INJECT P: 18 | 30 days supply | Qty: 9 | Fill #0

## 2018-11-25 ENCOUNTER — Other Ambulatory Visit: Payer: Self-pay

## 2018-11-25 MED ORDER — VICTOZA 18 MG/3ML ~~LOC~~ SOPN: PEN_INJECTOR | SUBCUTANEOUS | 1 refills | Status: DC

## 2018-12-10 ENCOUNTER — Telehealth (INDEPENDENT_AMBULATORY_CARE_PROVIDER_SITE_OTHER): Payer: 59 | Admitting: Family Medicine

## 2018-12-15 ENCOUNTER — Encounter (INDEPENDENT_AMBULATORY_CARE_PROVIDER_SITE_OTHER): Payer: Self-pay | Admitting: Family Medicine

## 2018-12-15 ENCOUNTER — Ambulatory Visit (INDEPENDENT_AMBULATORY_CARE_PROVIDER_SITE_OTHER): Payer: 59 | Admitting: Family Medicine

## 2018-12-15 ENCOUNTER — Other Ambulatory Visit: Payer: Self-pay

## 2018-12-15 DIAGNOSIS — Z6835 Body mass index (BMI) 35.0-35.9, adult: Secondary | ICD-10-CM | POA: Diagnosis not present

## 2018-12-15 DIAGNOSIS — E161 Other hypoglycemia: Secondary | ICD-10-CM

## 2018-12-15 DIAGNOSIS — E7849 Other hyperlipidemia: Secondary | ICD-10-CM

## 2018-12-16 NOTE — Progress Notes (Signed)
Office: [phone]  /  Fax: [phone] TeleHealth Visit:  Christina SheffieldAutumn S Rodriguez has verbally consented to this TeleHealth visit today. The patient is located in the car at work, the provider is located at the [facility] office. The participants in this visit include the listed provider and patient. The visit was conducted today via FaceTime.  HPI:   Chief Complaint: OBESITY Christina Rodriguez is here to discuss her progress with her obesity treatment plan. She is keeping a food journal with 1500 calories and 90 grams of protein and is following her eating plan approximately 90% of the time. She states she is walking/yard work 30 minutes 2-3 times per week. Christina Rodriguez has been very strict about journaling, getting between 1200-1500 calories and at least 90 grams of protein. She is getting closer to 1500 calories. She reports prednisone was stopped a few weeks ago. She does not report a weight today. We were unable to weigh the patient today for this TeleHealth visit. She feels as if she has maintained her weight since her last visit. She has lost 14 lbs since starting treatment with us.  Insulin Resistance Christina Rodriguez has a diagnosis of insulin resistance based on her elevated fasting insulin level >5. Her last A1c was elevated at 5.5 on 11/17/2018, up from 5.4 on 07/14/2018. Although Christina Rodriguez's blood glucose readings are still under good control, insulin resistance puts her at greater risk of metabolic syndrome and diabetes. She is not taking metformin currently and continues to work on diet and exercise to decrease risk of diabetes.  Hyperlipidemia Christina Rodriguez has hyperlipidemia and is not on a statin. She has been trying to improve her cholesterol levels with intensive lifestyle modification including a low saturated fat diet, exercise and weight loss. Her LDL is slightly elevated from prior labs.   ASSESSMENT AND PLAN:  No diagnosis found.  PLAN:  Insulin Resistance Christina Rodriguez will continue to work on  weight loss, exercise, and decreasing simple carbohydrates in her diet to help decrease the risk of diabetes. We dicussed metformin including benefits and risks. She was informed that eating too many simple carbohydrates or too many calories at one sitting increases the likelihood of GI side effects. Christina Rodriguez will increase Victoza to 2.1 mg sub-Q QD, no refill needed. She agrees to follow-up with our clinic in 2 weeks.  Hyperlipidemia Christina Rodriguez was informed of the American Heart Association Guidelines emphasizing intensive lifestyle modifications as the first line treatment for hyperlipidemia. We discussed many lifestyle modifications today in depth, and Christina Rodriguez will continue to work on decreasing saturated fats such as fatty red meat, butter and many fried foods. Christina Rodriguez will have repeat labs in November. She will also increase vegetables and lean protein in her diet and continue to work on exercise and weight loss efforts.  Obesity Christina Rodriguez is currently in the action stage of change. As such, her goal is to continue with weight loss efforts. She has agreed to keep a food journal with 1300-1400 calories and 90+ grams of protein daily. Christina Rodriguez has been instructed to work up to a goal of 150 minutes of combined cardio and strengthening exercise per week for weight loss and overall health benefits. We discussed the following Behavioral Modification Strategies today: increasing lean protein intake, increasing vegetables, work on meal planning and easy cooking plans, keeping healthy foods in the home, and planning for success.  Christina Rodriguez has agreed to follow-up with our clinic in 2 weeks. She was informed of the importance of frequent follow-up visits to maximize her success  with intensive lifestyle modifications for her multiple health conditions.  ALLERGIES: Allergies  Allergen Reactions  . Beta Adrenergic Blockers Shortness Of Breath  . Shrimp [Shellfish Allergy] Hives  . Soybeans Hives    MEDICATIONS:  Current Outpatient Medications on File Prior to Visit  Medication Sig Dispense Refill  . albuterol (VENTOLIN HFA) 108 (90 Base) MCG/ACT inhaler Inhale 2 puffs into the lungs every 4 (four) hours as needed for wheezing or shortness of breath. 18 g 5  . Continuous Blood Gluc Receiver (FREESTYLE LIBRE 14 DAY READER) DEVI 18 mg by Does not apply route continuous. 1 Device 0  . Continuous Blood Gluc Sensor (FREESTYLE LIBRE 14 DAY SENSOR) MISC 18 mg by Does not apply route continuous. 2 each 11  . cyanocobalamin 500 MCG tablet Take 500 mcg by mouth daily.    Marland Kitchen. diltiazem (CARDIZEM CD) 240 MG 24 hr capsule Take 1 capsule (240 mg total) by mouth daily. 90 capsule 1  . EPINEPHrine 0.3 mg/0.3 mL IJ SOAJ injection Inject 0.3 mg into the muscle once. Reported on 05/03/2015    . fluconazole (DIFLUCAN) 150 MG tablet Take one tablet by mouth three days apart 2 tablet 0  . Fluticasone-Salmeterol (ADVAIR DISKUS) 250-50 MCG/DOSE AEPB Inhale 1 puff into the lungs 2 (two) times daily. 60 each 5  . hydrochlorothiazide (HYDRODIURIL) 25 MG tablet Take 1 tablet (25 mg total) by mouth daily. 90 tablet 1  . ibuprofen (ADVIL,MOTRIN) 200 MG tablet Take 400 mg by mouth every 6 (six) hours as needed for headache, mild pain or moderate pain.    . Insulin Pen Needle (PEN NEEDLES) 32G X 5 MM MISC Inject 1.8 mg into the skin daily. 100 each 3  . liraglutide (VICTOZA) 18 MG/3ML SOPN Take  1.8 mg subcu qd 27 mL 1  . metFORMIN (GLUCOPHAGE-XR) 500 MG 24 hr tablet Take 2 tablets (1,000 mg total) by mouth daily with breakfast. 180 tablet 1  . Multiple Vitamins-Minerals (MULTIVITAMIN ADULT PO) Take 1 tablet by mouth daily.    . pantoprazole (PROTONIX) 40 MG tablet Take 1 tablet (40 mg total) by mouth daily. 90 tablet 1  . rizatriptan (MAXALT-MLT) 10 MG disintegrating tablet Take 1 tablet (10 mg total) by mouth as needed for migraine. May repeat in 2 hours if needed; max 2 per 24 hours 27 tablet 1  . tiZANidine (ZANAFLEX) 4 MG capsule Take  one capsule by mouth three times daily 30 capsule 0  . triamcinolone cream (KENALOG) 0.1 % Apply 1 application topically 2 (two) times daily. 45 g 4  . Vitamin D, Ergocalciferol, (DRISDOL) 1.25 MG (50000 UT) CAPS capsule Take 1 capsule (50,000 Units total) by mouth every 3 (three) days. 10 capsule 0   No current facility-administered medications on file prior to visit.     PAST MEDICAL HISTORY: Past Medical History:  Diagnosis Date  . Adenomyosis   . Anemia    HIstory of   . Asthma    albuterol inhaler prn-recent uri-using more freq  . Dysrhythmia    history of svt-controlled on cardizem-diagnosed in 1999-  . Gall bladder disease   . GERD (gastroesophageal reflux disease)    uses protonix  . Headache    migraines   . History of polycystic ovarian disease   . Infertility associated with anovulation   . Metabolic syndrome   . Multiple food allergies    shrimp  . Osteoarthritis   . Palpitations   . PCOS (polycystic ovarian syndrome)   . PONV (postoperative nausea  and vomiting)   . Prediabetes   . Recurrent upper respiratory infection (URI)    resolving  . SVT (supraventricular tachycardia) (HCC)    History of   . Vitamin D deficiency   . VSD (ventricular septal defect)    history of -no problems    PAST SURGICAL HISTORY: Past Surgical History:  Procedure Laterality Date  . CESAREAN SECTION  2010  . CESAREAN SECTION  11/16/2010   Procedure: CESAREAN SECTION;  Surgeon: Almon HerculesKendra H. Ross;  Location: [facility];  Service: Gynecology;  Laterality: N/A;  Repeat   . CHOLECYSTECTOMY  2003  . DILATION AND CURETTAGE OF UTERUS    . HYSTEROSCOPY W/D&C N/A 12/06/2014   Procedure: DILATATION AND CURETTAGE /HYSTEROSCOPY;  Surgeon: [name], MD;  Location: [facility];  Service: Gynecology;  Laterality: N/A;  . uterine polyps      SOCIAL HISTORY: Social History   Tobacco Use  . Smoking status: Never Smoker  . Smokeless tobacco: Never Used  Substance Use Topics  . Alcohol use: No  .  Drug use: No    FAMILY HISTORY: Family History  Problem Relation Age of Onset  . Diabetes Father   . Hypertension Father   . Hyperlipidemia Father   . Sleep apnea Father   . Obesity Father    ROS: ROS none noted.  PHYSICAL EXAM: Pt in no acute distress  RECENT LABS AND TESTS: BMET    Component Value Date/Time   NA 140 11/17/2018 0913   K 4.4 11/17/2018 0913   CL 102 11/17/2018 0913   CO2 22 11/17/2018 0913   GLUCOSE 84 11/17/2018 0913   GLUCOSE 127 (H) 11/16/2014 1625   BUN 19 11/17/2018 0913   CREATININE 0.65 11/17/2018 0913   CREATININE 0.62 01/20/2014 1317   CALCIUM 9.3 11/17/2018 0913   GFRNONAA 110 11/17/2018 0913   GFRAA 127 11/17/2018 0913   Lab Results  Component Value Date   HGBA1C 5.5 11/17/2018   HGBA1C 5.4 07/14/2018   HGBA1C 5.2 02/18/2018   HGBA1C 5.4 09/10/2017   HGBA1C 5.5 04/23/2017   Lab Results  Component Value Date   INSULIN 24.5 11/17/2018   INSULIN 34.1 (H) 07/14/2018   INSULIN 16.7 02/18/2018   INSULIN 24.7 09/10/2017   INSULIN 24.6 04/23/2017   CBC    Component Value Date/Time   WBC 7.4 11/17/2018 0913   WBC 8.9 11/16/2014 1625   RBC 4.63 11/17/2018 0913   RBC 4.34 11/16/2014 1625   HGB 13.1 11/17/2018 0913   HCT 40.6 11/17/2018 0913   PLT 340 11/17/2018 0913   MCV 88 11/17/2018 0913   MCH 28.3 11/17/2018 0913   MCH 27.0 11/16/2014 1625   MCHC 32.3 11/17/2018 0913   MCHC 31.8 11/16/2014 1625   RDW 14.3 11/17/2018 0913   LYMPHSABS 1.9 11/17/2018 0913   EOSABS 0.2 11/17/2018 0913   BASOSABS 0.0 11/17/2018 0913   Iron/TIBC/Ferritin/ %Sat    Component Value Date/Time   FERRITIN 21 02/22/2016 0823   Lipid Panel     Component Value Date/Time   CHOL 174 11/17/2018 0913   TRIG 78 11/17/2018 0913   HDL 40 11/17/2018 0913   CHOLHDL 3.6 02/22/2016 0823   CHOLHDL 4.6 01/20/2014 1317   VLDL 22 01/20/2014 1317   LDLCALC 118 (H) 11/17/2018 0913   Hepatic Function Panel     Component Value Date/Time   PROT 7.1  11/17/2018 0913   ALBUMIN 4.1 11/17/2018 0913   AST 18 11/17/2018 0913   ALT 17 11/17/2018 0913  ALKPHOS 63 11/17/2018 0913   BILITOT 0.4 11/17/2018 0913   BILIDIR 0.09 02/22/2016 0823   IBILI 0.2 01/20/2014 1317      Component Value Date/Time   TSH 2.220 12/26/2016 0957   TSH 3.130 02/22/2016 0823   TSH 2.880 01/26/2015 0823   Results for CAMBREE, HENDRIX (MRN [id number]) as of 12/16/2018 14:55  Ref. Range 11/17/2018 09:13  Vitamin D, 25-Hydroxy Latest Ref Range: 30.0 - 100.0 ng/mL 34.6   I, Michaelene Song, am acting as Location manager for Ilene Qua, MD  I have reviewed the above documentation for accuracy and completeness, and I agree with the above. - Ilene Qua, MD

## 2018-12-23 MED FILL — PANTOPRAZOLE SOD DR 40 MG T: 40 | 90 days supply | Qty: 90 | Fill #0

## 2018-12-23 MED FILL — VICTOZA 18 MG/3 ML INJECT P: 18 | 90 days supply | Qty: 27 | Fill #0

## 2018-12-23 MED FILL — metFORMIN HCL ER 500 MG TB2: 500 | 30 days supply | Qty: 60 | Fill #1

## 2018-12-31 ENCOUNTER — Other Ambulatory Visit: Payer: Self-pay

## 2018-12-31 ENCOUNTER — Telehealth (INDEPENDENT_AMBULATORY_CARE_PROVIDER_SITE_OTHER): Payer: 59 | Admitting: Family Medicine

## 2018-12-31 DIAGNOSIS — E161 Other hypoglycemia: Secondary | ICD-10-CM | POA: Diagnosis not present

## 2018-12-31 DIAGNOSIS — E559 Vitamin D deficiency, unspecified: Secondary | ICD-10-CM

## 2018-12-31 MED ORDER — VITAMIN D (ERGOCALCIFEROL) 1.25 MG (50000 UNIT) PO CAPS: 50000.0000 [IU] | ORAL_CAPSULE | ORAL | 0 refills | Status: DC

## 2018-12-31 MED FILL — VIT D2 1.25 MG (50,000 UNIT: 1.25 MG | 30 days supply | Qty: 10 | Fill #0

## 2019-01-04 NOTE — Progress Notes (Signed)
Office: [phone]  /  Fax: [phone] TeleHealth Visit:  Christina Rodriguez has verbally consented to this TeleHealth visit today. The patient is located at home, the provider is located at the [facility] office. The participants in this visit include the listed provider and patient. The visit was conducted today via face time.  HPI:   Chief Complaint: OBESITY Christina Rodriguez is here to discuss her progress with her obesity treatment plan. She is on the keep a food journal with 1300-1400 calories and 90+ grams of protein daily and is following her eating plan approximately 90 % of the time. She states she is walking for 15-20 minutes 2 times per week. 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 Her kids are still going to childcare during the day. She notes work has been very busy. Her weight is of 254 lbs today. She is staying between 1300 and 1500 calories and getting 80-90 grams of protein.  We were unable to weigh the patient today for this TeleHealth visit. She feels as if she has maintained her weight since her last visit. She has lost 14 lbs since starting treatment with us.  Insulin Resistance Christina Rodriguez has a diagnosis of insulin resistance based on her elevated fasting insulin level >5. Although Christina Rodriguez's blood glucose readings are still under good control, insulin resistance puts her at greater risk of metabolic syndrome and diabetes. She notes she is not as hungry in the evening with increase in Vicotza. She denies GI side effects. She continues to work on diet and exercise to decrease risk of diabetes.  Vitamin D Deficiency Christina Rodriguez has a diagnosis of vitamin D deficiency. She is currently taking prescription Vit D. She notes fatigue and denies nausea, vomiting or muscle weakness.  ASSESSMENT AND PLAN:  Hyperinsulinemia  Vitamin D deficiency - Plan: Vitamin D, Ergocalciferol, (DRISDOL) 1.25 MG (50000 UT) CAPS capsule  PLAN:  Insulin Resistance [name] will continue to work on weight loss, exercise, and decreasing simple carbohydrates in her diet to help decrease the risk of diabetes. We dicussed metformin including benefits and risks. She was informed that eating too many simple carbohydrates or too many calories at one sitting increases the likelihood of GI side effects. Akshita agrees to continue her current dose of Victoza. Xolani agrees to follow up with our clinic in 2 weeks as directed to monitor her progress.  Vitamin D Deficiency Christina Rodriguez was informed that low vitamin D levels contributes to fatigue and are associated with obesity, breast, and colon cancer. Christina Rodriguez agrees to continue taking prescription Vit D 50,000 IU every week #4 and we will refill for 1 month. She will follow up for routine testing of vitamin D, at least 2-3 times per year. She was informed of the risk of over-replacement of vitamin D and agrees to not increase her dose unless she discusses this with us first. Christina Rodriguez agrees to follow up with our clinic in 2 weeks.  Obesity Christina Rodriguez is currently in the action stage of change. As such, her goal is to continue with weight loss efforts She has agreed to keep a food journal with 1500 calories and 90+ grams of protein daily Christina Rodriguez has been instructed to work up to a goal of 150 minutes of combined cardio and strengthening exercise per week for weight loss and overall health benefits. We discussed the following Behavioral Modification Strategies today: increasing lean protein intake, increasing vegetables and work on meal planning and easy  cooking plans, keeping healthy foods in the home, and planning for success   [name] has agreed to follow up with our clinic in 2 weeks. She was informed of the importance of frequent follow up visits to maximize her success with intensive lifestyle modifications for her multiple health conditions.  ALLERGIES: Allergies  Allergen Reactions  . Beta Adrenergic Blockers  Shortness Of Breath  . Shrimp [Shellfish Allergy] Hives  . Soybeans Hives    MEDICATIONS: Current Outpatient Medications on File Prior to Visit  Medication Sig Dispense Refill  . albuterol (VENTOLIN HFA) 108 (90 Base) MCG/ACT inhaler Inhale 2 puffs into the lungs every 4 (four) hours as needed for wheezing or shortness of breath. 18 g 5  . Continuous Blood Gluc Receiver (FREESTYLE LIBRE 14 DAY READER) DEVI 18 mg by Does not apply route continuous. 1 Device 0  . Continuous Blood Gluc Sensor (FREESTYLE LIBRE 14 DAY SENSOR) MISC 18 mg by Does not apply route continuous. 2 each 11  . cyanocobalamin 500 MCG tablet Take 500 mcg by mouth daily.    Christina Rodriguez diltiazem (CARDIZEM CD) 240 MG 24 hr capsule Take 1 capsule (240 mg total) by mouth daily. 90 capsule 1  . EPINEPHrine 0.3 mg/0.3 mL IJ SOAJ injection Inject 0.3 mg into the muscle once. Reported on 05/03/2015    . fluconazole (DIFLUCAN) 150 MG tablet Take one tablet by mouth three days apart 2 tablet 0  . Fluticasone-Salmeterol (ADVAIR DISKUS) 250-50 MCG/DOSE AEPB Inhale 1 puff into the lungs 2 (two) times daily. 60 each 5  . hydrochlorothiazide (HYDRODIURIL) 25 MG tablet Take 1 tablet (25 mg total) by mouth daily. 90 tablet 1  . ibuprofen (ADVIL,MOTRIN) 200 MG tablet Take 400 mg by mouth every 6 (six) hours as needed for headache, mild pain or moderate pain.    . Insulin Pen Needle (PEN NEEDLES) 32G X 5 MM MISC Inject 1.8 mg into the skin daily. 100 each 3  . liraglutide (VICTOZA) 18 MG/3ML SOPN Take  1.8 mg subcu qd 27 mL 1  . metFORMIN (GLUCOPHAGE-XR) 500 MG 24 hr tablet Take 2 tablets (1,000 mg total) by mouth daily with breakfast. 180 tablet 1  . Multiple Vitamins-Minerals (MULTIVITAMIN ADULT PO) Take 1 tablet by mouth daily.    . pantoprazole (PROTONIX) 40 MG tablet Take 1 tablet (40 mg total) by mouth daily. 90 tablet 1  . rizatriptan (MAXALT-MLT) 10 MG disintegrating tablet Take 1 tablet (10 mg total) by mouth as needed for migraine. May repeat  in 2 hours if needed; max 2 per 24 hours 27 tablet 1  . tiZANidine (ZANAFLEX) 4 MG capsule Take one capsule by mouth three times daily 30 capsule 0  . triamcinolone cream (KENALOG) 0.1 % Apply 1 application topically 2 (two) times daily. 45 g 4   No current facility-administered medications on file prior to visit.     PAST MEDICAL HISTORY: Past Medical History:  Diagnosis Date  . Adenomyosis   . Anemia    HIstory of   . Asthma    albuterol inhaler prn-recent uri-using more freq  . Dysrhythmia    history of svt-controlled on cardizem-diagnosed in 1999-  . Gall bladder disease   . GERD (gastroesophageal reflux disease)    uses protonix  . Headache    migraines   . History of polycystic ovarian disease   . Infertility associated with anovulation   . Metabolic syndrome   . Multiple food allergies    shrimp  . Osteoarthritis   .  Palpitations   . PCOS (polycystic ovarian syndrome)   . PONV (postoperative nausea and vomiting)   . Prediabetes   . Recurrent upper respiratory infection (URI)    resolving  . SVT (supraventricular tachycardia) (HCC)    History of   . Vitamin D deficiency   . VSD (ventricular septal defect)    history of -no problems    PAST SURGICAL HISTORY: Past Surgical History:  Procedure Laterality Date  . CESAREAN SECTION  2010  . CESAREAN SECTION  11/16/2010   Procedure: CESAREAN SECTION;  Surgeon: Marcial Pacas;  Location: [facility];  Service: Gynecology;  Laterality: N/A;  Repeat   . CHOLECYSTECTOMY  2003  . DILATION AND CURETTAGE OF UTERUS    . HYSTEROSCOPY W/D&C N/A 12/06/2014   Procedure: DILATATION AND CURETTAGE /HYSTEROSCOPY;  Surgeon: Vanessa Kick, MD;  Location: [facility];  Service: Gynecology;  Laterality: N/A;  . uterine polyps      SOCIAL HISTORY: Social History   Tobacco Use  . Smoking status: Never Smoker  . Smokeless tobacco: Never Used  Substance Use Topics  . Alcohol use: No  . Drug use: No    FAMILY HISTORY: Family History   Problem Relation Age of Onset  . Diabetes Father   . Hypertension Father   . Hyperlipidemia Father   . Sleep apnea Father   . Obesity Father     ROS: Review of Systems  Constitutional: Positive for malaise/fatigue. Negative for weight loss.  Gastrointestinal: Negative for nausea and vomiting.  Musculoskeletal:       Negative muscle weakness    PHYSICAL EXAM: Pt in no acute distress  RECENT LABS AND TESTS: BMET    Component Value Date/Time   NA 140 11/17/2018 0913   K 4.4 11/17/2018 0913   CL 102 11/17/2018 0913   CO2 22 11/17/2018 0913   GLUCOSE 84 11/17/2018 0913   GLUCOSE 127 (H) 11/16/2014 1625   BUN 19 11/17/2018 0913   CREATININE 0.65 11/17/2018 0913   CREATININE 0.62 01/20/2014 1317   CALCIUM 9.3 11/17/2018 0913   GFRNONAA 110 11/17/2018 0913   GFRAA 127 11/17/2018 0913   Lab Results  Component Value Date   HGBA1C 5.5 11/17/2018   HGBA1C 5.4 07/14/2018   HGBA1C 5.2 02/18/2018   HGBA1C 5.4 09/10/2017   HGBA1C 5.5 04/23/2017   Lab Results  Component Value Date   INSULIN 24.5 11/17/2018   INSULIN 34.1 (H) 07/14/2018   INSULIN 16.7 02/18/2018   INSULIN 24.7 09/10/2017   INSULIN 24.6 04/23/2017   CBC    Component Value Date/Time   WBC 7.4 11/17/2018 0913   WBC 8.9 11/16/2014 1625   RBC 4.63 11/17/2018 0913   RBC 4.34 11/16/2014 1625   HGB 13.1 11/17/2018 0913   HCT 40.6 11/17/2018 0913   PLT 340 11/17/2018 0913   MCV 88 11/17/2018 0913   MCH 28.3 11/17/2018 0913   MCH 27.0 11/16/2014 1625   MCHC 32.3 11/17/2018 0913   MCHC 31.8 11/16/2014 1625   RDW 14.3 11/17/2018 0913   LYMPHSABS 1.9 11/17/2018 0913   EOSABS 0.2 11/17/2018 0913   BASOSABS 0.0 11/17/2018 0913   Iron/TIBC/Ferritin/ %Sat    Component Value Date/Time   FERRITIN 21 02/22/2016 0823   Lipid Panel     Component Value Date/Time   CHOL 174 11/17/2018 0913   TRIG 78 11/17/2018 0913   HDL 40 11/17/2018 0913   CHOLHDL 3.6 02/22/2016 0823   CHOLHDL 4.6 01/20/2014 1317   VLDL  22 01/20/2014  1317   LDLCALC 118 (H) 11/17/2018 0913   Hepatic Function Panel     Component Value Date/Time   PROT 7.1 11/17/2018 0913   ALBUMIN 4.1 11/17/2018 0913   AST 18 11/17/2018 0913   ALT 17 11/17/2018 0913   ALKPHOS 63 11/17/2018 0913   BILITOT 0.4 11/17/2018 0913   BILIDIR 0.09 02/22/2016 0823   IBILI 0.2 01/20/2014 1317      Component Value Date/Time   TSH 2.220 12/26/2016 0957   TSH 3.130 02/22/2016 0823   TSH 2.880 01/26/2015 0823      I, Burt KnackSharon Martin, am acting as transcriptionist for Debbra RidingAlexandria Kadolph, MD  I have reviewed the above documentation for accuracy and completeness, and I agree with the above. - Debbra RidingAlexandria Kadolph, MD

## 2019-01-20 ENCOUNTER — Encounter (INDEPENDENT_AMBULATORY_CARE_PROVIDER_SITE_OTHER): Payer: Self-pay | Admitting: Family Medicine

## 2019-01-20 ENCOUNTER — Other Ambulatory Visit: Payer: Self-pay

## 2019-01-20 ENCOUNTER — Ambulatory Visit (INDEPENDENT_AMBULATORY_CARE_PROVIDER_SITE_OTHER): Payer: 59 | Admitting: Family Medicine

## 2019-01-20 DIAGNOSIS — I471 Supraventricular tachycardia, unspecified: Secondary | ICD-10-CM

## 2019-01-20 DIAGNOSIS — Z6839 Body mass index (BMI) 39.0-39.9, adult: Secondary | ICD-10-CM

## 2019-01-20 DIAGNOSIS — E7849 Other hyperlipidemia: Secondary | ICD-10-CM | POA: Diagnosis not present

## 2019-01-21 NOTE — Progress Notes (Signed)
Office: [phone]  /  Fax: [phone] TeleHealth Visit:  Christina SheffieldAutumn S Rodriguez has verbally consented to this TeleHealth visit today. The patient is located at home, the provider is located at the [facility] office. The participants in this visit include the listed provider and patient. The visit was conducted today via face time.  HPI:   Chief Complaint: OBESITY Christina Rodriguez is here to discuss her progress with her obesity treatment plan. She is on the keep a food journal with 1500 calories and 90+ grams of protein daily and is following her eating plan approximately 95 % of the time. She states she is exercising 30 minutes 3-4 times per week. Christina Rodriguez reports the last few weeks she has been strict on journaling. She reports a 2 lb weight loss (weight of 252 lbs today). She denies hunger. She has been cooking more at home the past few weeks. She is staying within her calories and is getting protein goals in daily.  We were unable to weigh the patient today for this TeleHealth visit. She feels as if she has lost 2 lbs since her last visit. She has lost 14 lbs since starting treatment with us.  Hyperlipidemia Christina Rodriguez has hyperlipidemia and has been trying to improve her cholesterol levels with intensive lifestyle modification including a low saturated fat diet, exercise and weight loss. Last LDL was of 118, HDL of 40, and triglycerides of 78. Previous LDL was of <100. She is not on a statin and denies any chest pain, claudication or myalgias.  Supraventricular Tachycardia Christina Rodriguez denies any symptoms. She is on Cardizem and is well controlled.  ASSESSMENT AND PLAN:  Other hyperlipidemia  SVT (supraventricular tachycardia) (HCC)  Class 2 severe obesity with serious comorbidity and body mass index (BMI) of 39.0 to 39.9 in adult, unspecified obesity type (HCC)  PLAN:  Hyperlipidemia Christina Rodriguez was informed of the American Heart Association Guidelines emphasizing intensive lifestyle  modifications as the first line treatment for hyperlipidemia. We discussed many lifestyle modifications today in depth, and [name] will continue to work on decreasing saturated fats such as fatty red meat, butter and many fried foods. She will also increase vegetables and lean protein in her diet and continue to work on exercise and weight loss efforts. We will repeat FLP in 3 months.  Supraventricular Tachycardia Christina Rodriguez agrees to continue taking Cardizem, no change in dose. Christina Rodriguez agrees to follow up with our clinic in 2 weeks.  Obesity Christina Rodriguez is currently in the action stage of change. As such, her goal is to continue with weight loss efforts She has agreed to keep a food journal with 1500 calories and 90+ grams of protein daily Christina Rodriguez has been instructed to work up to a goal of 150 minutes of combined cardio and strengthening exercise per week for weight loss and overall health benefits. We discussed the following Behavioral Modification Strategies today: increasing lean protein intake and work on meal planning and easy cooking plans, better snacking choices, planning for success, and keep a strict food journal   Christina Rodriguez has agreed to follow up with our clinic in 2 weeks. She was informed of the importance of frequent follow up visits to maximize her success with intensive lifestyle modifications for her multiple health conditions.  ALLERGIES: Allergies  Allergen Reactions  . Beta Adrenergic Blockers Shortness Of Breath  . Shrimp [Shellfish Allergy] Hives  . Soybeans Hives    MEDICATIONS: Current Outpatient Medications on File Prior to Visit  Medication Sig Dispense Refill  . albuterol (  VENTOLIN HFA) 108 (90 Base) MCG/ACT inhaler Inhale 2 puffs into the lungs every 4 (four) hours as needed for wheezing or shortness of breath. 18 g 5  . Continuous Blood Gluc Receiver (FREESTYLE LIBRE 14 DAY READER) DEVI 18 mg by Does not apply route continuous. 1 Device 0  . Continuous Blood Gluc Sensor  (FREESTYLE LIBRE 14 DAY SENSOR) MISC 18 mg by Does not apply route continuous. 2 each 11  . cyanocobalamin 500 MCG tablet Take 500 mcg by mouth daily.    Marland Kitchen. diltiazem (CARDIZEM CD) 240 MG 24 hr capsule Take 1 capsule (240 mg total) by mouth daily. 90 capsule 1  . EPINEPHrine 0.3 mg/0.3 mL IJ SOAJ injection Inject 0.3 mg into the muscle once. Reported on 05/03/2015    . fluconazole (DIFLUCAN) 150 MG tablet Take one tablet by mouth three days apart 2 tablet 0  . Fluticasone-Salmeterol (ADVAIR DISKUS) 250-50 MCG/DOSE AEPB Inhale 1 puff into the lungs 2 (two) times daily. 60 each 5  . hydrochlorothiazide (HYDRODIURIL) 25 MG tablet Take 1 tablet (25 mg total) by mouth daily. 90 tablet 1  . ibuprofen (ADVIL,MOTRIN) 200 MG tablet Take 400 mg by mouth every 6 (six) hours as needed for headache, mild pain or moderate pain.    . Insulin Pen Needle (PEN NEEDLES) 32G X 5 MM MISC Inject 1.8 mg into the skin daily. 100 each 3  . liraglutide (VICTOZA) 18 MG/3ML SOPN Take  1.8 mg subcu qd 27 mL 1  . metFORMIN (GLUCOPHAGE-XR) 500 MG 24 hr tablet Take 2 tablets (1,000 mg total) by mouth daily with breakfast. 180 tablet 1  . Multiple Vitamins-Minerals (MULTIVITAMIN ADULT PO) Take 1 tablet by mouth daily.    . pantoprazole (PROTONIX) 40 MG tablet Take 1 tablet (40 mg total) by mouth daily. 90 tablet 1  . rizatriptan (MAXALT-MLT) 10 MG disintegrating tablet Take 1 tablet (10 mg total) by mouth as needed for migraine. May repeat in 2 hours if needed; max 2 per 24 hours 27 tablet 1  . tiZANidine (ZANAFLEX) 4 MG capsule Take one capsule by mouth three times daily 30 capsule 0  . triamcinolone cream (KENALOG) 0.1 % Apply 1 application topically 2 (two) times daily. 45 g 4  . Vitamin D, Ergocalciferol, (DRISDOL) 1.25 MG (50000 UT) CAPS capsule Take 1 capsule (50,000 Units total) by mouth every 3 (three) days. 10 capsule 0   No current facility-administered medications on file prior to visit.     PAST MEDICAL HISTORY:  Past Medical History:  Diagnosis Date  . Adenomyosis   . Anemia    HIstory of   . Asthma    albuterol inhaler prn-recent uri-using more freq  . Dysrhythmia    history of svt-controlled on cardizem-diagnosed in 1999-  . Gall bladder disease   . GERD (gastroesophageal reflux disease)    uses protonix  . Headache    migraines   . History of polycystic ovarian disease   . Infertility associated with anovulation   . Metabolic syndrome   . Multiple food allergies    shrimp  . Osteoarthritis   . Palpitations   . PCOS (polycystic ovarian syndrome)   . PONV (postoperative nausea and vomiting)   . Prediabetes   . Recurrent upper respiratory infection (URI)    resolving  . SVT (supraventricular tachycardia) (HCC)    History of   . Vitamin D deficiency   . VSD (ventricular septal defect)    history of -no problems    PAST  SURGICAL HISTORY: Past Surgical History:  Procedure Laterality Date  . CESAREAN SECTION  2010  . CESAREAN SECTION  11/16/2010   Procedure: CESAREAN SECTION;  Surgeon: Almon Hercules;  Location: [facility];  Service: Gynecology;  Laterality: N/A;  Repeat   . CHOLECYSTECTOMY  2003  . DILATION AND CURETTAGE OF UTERUS    . HYSTEROSCOPY W/D&C N/A 12/06/2014   Procedure: DILATATION AND CURETTAGE /HYSTEROSCOPY;  Surgeon: Waynard Reeds, MD;  Location: [facility];  Service: Gynecology;  Laterality: N/A;  . uterine polyps      SOCIAL HISTORY: Social History   Tobacco Use  . Smoking status: Never Smoker  . Smokeless tobacco: Never Used  Substance Use Topics  . Alcohol use: No  . Drug use: No    FAMILY HISTORY: Family History  Problem Relation Age of Onset  . Diabetes Father   . Hypertension Father   . Hyperlipidemia Father   . Sleep apnea Father   . Obesity Father     ROS: Review of Systems  Constitutional: Positive for weight loss.  Cardiovascular: Negative for chest pain and claudication.  Musculoskeletal: Negative for myalgias.    PHYSICAL EXAM: Pt in no  acute distress  RECENT LABS AND TESTS: BMET    Component Value Date/Time   NA 140 11/17/2018 0913   K 4.4 11/17/2018 0913   CL 102 11/17/2018 0913   CO2 22 11/17/2018 0913   GLUCOSE 84 11/17/2018 0913   GLUCOSE 127 (H) 11/16/2014 1625   BUN 19 11/17/2018 0913   CREATININE 0.65 11/17/2018 0913   CREATININE 0.62 01/20/2014 1317   CALCIUM 9.3 11/17/2018 0913   GFRNONAA 110 11/17/2018 0913   GFRAA 127 11/17/2018 0913   Lab Results  Component Value Date   HGBA1C 5.5 11/17/2018   HGBA1C 5.4 07/14/2018   HGBA1C 5.2 02/18/2018   HGBA1C 5.4 09/10/2017   HGBA1C 5.5 04/23/2017   Lab Results  Component Value Date   INSULIN 24.5 11/17/2018   INSULIN 34.1 (H) 07/14/2018   INSULIN 16.7 02/18/2018   INSULIN 24.7 09/10/2017   INSULIN 24.6 04/23/2017   CBC    Component Value Date/Time   WBC 7.4 11/17/2018 0913   WBC 8.9 11/16/2014 1625   RBC 4.63 11/17/2018 0913   RBC 4.34 11/16/2014 1625   HGB 13.1 11/17/2018 0913   HCT 40.6 11/17/2018 0913   PLT 340 11/17/2018 0913   MCV 88 11/17/2018 0913   MCH 28.3 11/17/2018 0913   MCH 27.0 11/16/2014 1625   MCHC 32.3 11/17/2018 0913   MCHC 31.8 11/16/2014 1625   RDW 14.3 11/17/2018 0913   LYMPHSABS 1.9 11/17/2018 0913   EOSABS 0.2 11/17/2018 0913   BASOSABS 0.0 11/17/2018 0913   Iron/TIBC/Ferritin/ %Sat    Component Value Date/Time   FERRITIN 21 02/22/2016 0823   Lipid Panel     Component Value Date/Time   CHOL 174 11/17/2018 0913   TRIG 78 11/17/2018 0913   HDL 40 11/17/2018 0913   CHOLHDL 3.6 02/22/2016 0823   CHOLHDL 4.6 01/20/2014 1317   VLDL 22 01/20/2014 1317   LDLCALC 118 (H) 11/17/2018 0913   Hepatic Function Panel     Component Value Date/Time   PROT 7.1 11/17/2018 0913   ALBUMIN 4.1 11/17/2018 0913   AST 18 11/17/2018 0913   ALT 17 11/17/2018 0913   ALKPHOS 63 11/17/2018 0913   BILITOT 0.4 11/17/2018 0913   BILIDIR 0.09 02/22/2016 0823   IBILI 0.2 01/20/2014 1317      Component Value Date/Time  TSH  2.220 12/26/2016 0957   TSH 3.130 02/22/2016 0823   TSH 2.880 01/26/2015 0823      I, Trixie Dredge, am acting as transcriptionist for Ilene Qua, MD  I have reviewed the above documentation for accuracy and completeness, and I agree with the above. - Ilene Qua, MD

## 2019-02-04 ENCOUNTER — Telehealth (INDEPENDENT_AMBULATORY_CARE_PROVIDER_SITE_OTHER): Payer: 59 | Admitting: Family Medicine

## 2019-02-04 ENCOUNTER — Other Ambulatory Visit: Payer: Self-pay

## 2019-02-04 DIAGNOSIS — E7849 Other hyperlipidemia: Secondary | ICD-10-CM

## 2019-02-04 DIAGNOSIS — Z6835 Body mass index (BMI) 35.0-35.9, adult: Secondary | ICD-10-CM

## 2019-02-04 DIAGNOSIS — R7303 Prediabetes: Secondary | ICD-10-CM

## 2019-02-08 NOTE — Progress Notes (Signed)
Office: [phone]  /  Fax: [phone] TeleHealth Visit:  Christina Rodriguez has verbally consented to this TeleHealth visit today. The patient is located in the car, the provider is located at the [facility] office. The participants in this visit include the listed provider and patient. The visit was conducted today via FaceTime.  HPI:   Chief Complaint: OBESITY Christina Rodriguez is here to discuss her progress with her obesity treatment plan. She is keeping a food journal with 1500 calories and 90 grams of protein and is following her eating plan approximately 90% of the time. She states she is walking 30 minutes 3-4 times per week. Christina Rodriguez reports she is doing well journaling - staying around 1500 calories. She occasionally overindulges on calories and is not always hitting the 90 grams of daily protein. She reports not being very hungry for dinner and is not doing much snacking. We were unable to weigh the patient today for this TeleHealth visit. She feels as if she has maintained her weight since her last visit. She has lost 8 lbs since starting treatment with [place].  Pre-Diabetes Christina Rodriguez has a diagnosis of prediabetes based on her elevated Hgb A1c and was informed this puts her at greater risk of developing diabetes. She is on Victoza and reports no side effects. She reports minimal carb cravings. She denies nausea or hypoglycemia.  Hyperlipidemia Christina Rodriguez has hyperlipidemia and is not on a statin. Last LDL 118 on 11/17/2018. She has been trying to improve her cholesterol levels with intensive lifestyle modification including a low saturated fat diet, exercise and weight loss. She denies any chest pain, claudication or myalgias.  ASSESSMENT AND PLAN:  Prediabetes  Other hyperlipidemia  Class 2 severe obesity with serious comorbidity and body mass index (BMI) of 35.0 to 35.9 in adult, unspecified obesity type [facility])  PLAN:  Pre-Diabetes Christina Rodriguez will continue to work on weight loss,  exercise, and decreasing simple carbohydrates in her diet to help decrease the risk of diabetes. We dicussed metformin including benefits and risks. She was informed that eating too many simple carbohydrates or too many calories at one sitting increases the likelihood of GI side effects. Izabela will increase Victoza to 2.4 mg SQ daily and will follow-up with our clinic in 2 weeks to monitor her progress.  Hyperlipidemia Christina Rodriguez was informed of the American Heart Association Guidelines emphasizing intensive lifestyle modifications as the first line treatment for hyperlipidemia. We discussed many lifestyle modifications today in depth, and Christina Rodriguez will continue to work on decreasing saturated fats such as fatty red meat, butter and many fried foods. Christina Rodriguez will have repeat labs in November 2020. She will also increase vegetables and lean protein in her diet and continue to work on exercise and weight loss efforts.  Obesity Christina Rodriguez is currently in the action stage of change. As such, her goal is to continue with weight loss efforts. She has agreed to keep a food journal with 1500 calories and 90+ grams of protein daily. Christina Rodriguez will have repeat IC at her next visit. Christina Rodriguez has been instructed to work up to a goal of 150 minutes of combined cardio and strengthening exercise per week for weight loss and overall health benefits. We discussed the following Behavioral Modification Strategies today: increasing lean protein intake, increasing vegetables, work on meal planning and easy cooking plans, keeping healthy foods in the home, and planning for success.  Christina Rodriguez has agreed to follow-up with our clinic in 2 weeks. She was informed of the importance of  frequent follow-up visits to maximize her success with intensive lifestyle modifications for her multiple health conditions.  ALLERGIES: Allergies  Allergen Reactions  . Beta Adrenergic Blockers Shortness Of Breath  . Shrimp [Shellfish Allergy] Hives  .  Soybeans Hives    MEDICATIONS: Current Outpatient Medications on File Prior to Visit  Medication Sig Dispense Refill  . albuterol (VENTOLIN HFA) 108 (90 Base) MCG/ACT inhaler Inhale 2 puffs into the lungs every 4 (four) hours as needed for wheezing or shortness of breath. 18 g 5  . Continuous Blood Gluc Receiver (FREESTYLE LIBRE 14 DAY READER) DEVI 18 mg by Does not apply route continuous. 1 Device 0  . Continuous Blood Gluc Sensor (FREESTYLE LIBRE 14 DAY SENSOR) MISC 18 mg by Does not apply route continuous. 2 each 11  . cyanocobalamin 500 MCG tablet Take 500 mcg by mouth daily.    Marland Kitchen diltiazem (CARDIZEM CD) 240 MG 24 hr capsule Take 1 capsule (240 mg total) by mouth daily. 90 capsule 1  . EPINEPHrine 0.3 mg/0.3 mL IJ SOAJ injection Inject 0.3 mg into the muscle once. Reported on 05/03/2015    . fluconazole (DIFLUCAN) 150 MG tablet Take one tablet by mouth three days apart 2 tablet 0  . Fluticasone-Salmeterol (ADVAIR DISKUS) 250-50 MCG/DOSE AEPB Inhale 1 puff into the lungs 2 (two) times daily. 60 each 5  . hydrochlorothiazide (HYDRODIURIL) 25 MG tablet Take 1 tablet (25 mg total) by mouth daily. 90 tablet 1  . ibuprofen (ADVIL,MOTRIN) 200 MG tablet Take 400 mg by mouth every 6 (six) hours as needed for headache, mild pain or moderate pain.    . Insulin Pen Needle (PEN NEEDLES) 32G X 5 MM MISC Inject 1.8 mg into the skin daily. 100 each 3  . liraglutide (VICTOZA) 18 MG/3ML SOPN Take  1.8 mg subcu qd 27 mL 1  . metFORMIN (GLUCOPHAGE-XR) 500 MG 24 hr tablet Take 2 tablets (1,000 mg total) by mouth daily with breakfast. 180 tablet 1  . Multiple Vitamins-Minerals (MULTIVITAMIN ADULT PO) Take 1 tablet by mouth daily.    . pantoprazole (PROTONIX) 40 MG tablet Take 1 tablet (40 mg total) by mouth daily. 90 tablet 1  . rizatriptan (MAXALT-MLT) 10 MG disintegrating tablet Take 1 tablet (10 mg total) by mouth as needed for migraine. May repeat in 2 hours if needed; max 2 per 24 hours 27 tablet 1  .  tiZANidine (ZANAFLEX) 4 MG capsule Take one capsule by mouth three times daily 30 capsule 0  . triamcinolone cream (KENALOG) 0.1 % Apply 1 application topically 2 (two) times daily. 45 g 4  . Vitamin D, Ergocalciferol, (DRISDOL) 1.25 MG (50000 UT) CAPS capsule Take 1 capsule (50,000 Units total) by mouth every 3 (three) days. 10 capsule 0   No current facility-administered medications on file prior to visit.     PAST MEDICAL HISTORY: Past Medical History:  Diagnosis Date  . Adenomyosis   . Anemia    HIstory of   . Asthma    albuterol inhaler prn-recent uri-using more freq  . Dysrhythmia    history of svt-controlled on cardizem-diagnosed in 1999-  . Gall bladder disease   . GERD (gastroesophageal reflux disease)    uses protonix  . Headache    migraines   . History of polycystic ovarian disease   . Infertility associated with anovulation   . Metabolic syndrome   . Multiple food allergies    shrimp  . Osteoarthritis   . Palpitations   . PCOS (polycystic ovarian  syndrome)   . PONV (postoperative nausea and vomiting)   . Prediabetes   . Recurrent upper respiratory infection (URI)    resolving  . SVT (supraventricular tachycardia) (HCC)    History of   . Vitamin D deficiency   . VSD (ventricular septal defect)    history of -no problems    PAST SURGICAL HISTORY: Past Surgical History:  Procedure Laterality Date  . CESAREAN SECTION  2010  . CESAREAN SECTION  11/16/2010   Procedure: CESAREAN SECTION;  Surgeon: Almon HerculesKendra H. Ross;  Location: [facility];  Service: Gynecology;  Laterality: N/A;  Repeat   . CHOLECYSTECTOMY  2003  . DILATION AND CURETTAGE OF UTERUS    . HYSTEROSCOPY W/D&C N/A 12/06/2014   Procedure: DILATATION AND CURETTAGE /HYSTEROSCOPY;  Surgeon: [name], MD;  Location: [facility];  Service: Gynecology;  Laterality: N/A;  . uterine polyps      SOCIAL HISTORY: Social History   Tobacco Use  . Smoking status: Never Smoker  . Smokeless tobacco: Never Used   Substance Use Topics  . Alcohol use: No  . Drug use: No    FAMILY HISTORY: Family History  Problem Relation Age of Onset  . Diabetes Father   . Hypertension Father   . Hyperlipidemia Father   . Sleep apnea Father   . Obesity Father    ROS: Review of Systems  Cardiovascular: Negative for chest pain and claudication.  Gastrointestinal: Negative for nausea.  Musculoskeletal: Negative for myalgias.  Endo/Heme/Allergies:       Negative for hypoglycemia.   PHYSICAL EXAM: Pt in no acute distress  RECENT LABS AND TESTS: BMET    Component Value Date/Time   NA 140 11/17/2018 0913   K 4.4 11/17/2018 0913   CL 102 11/17/2018 0913   CO2 22 11/17/2018 0913   GLUCOSE 84 11/17/2018 0913   GLUCOSE 127 (H) 11/16/2014 1625   BUN 19 11/17/2018 0913   CREATININE 0.65 11/17/2018 0913   CREATININE 0.62 01/20/2014 1317   CALCIUM 9.3 11/17/2018 0913   GFRNONAA 110 11/17/2018 0913   GFRAA 127 11/17/2018 0913   Lab Results  Component Value Date   HGBA1C 5.5 11/17/2018   HGBA1C 5.4 07/14/2018   HGBA1C 5.2 02/18/2018   HGBA1C 5.4 09/10/2017   HGBA1C 5.5 04/23/2017   Lab Results  Component Value Date   INSULIN 24.5 11/17/2018   INSULIN 34.1 (H) 07/14/2018   INSULIN 16.7 02/18/2018   INSULIN 24.7 09/10/2017   INSULIN 24.6 04/23/2017   CBC    Component Value Date/Time   WBC 7.4 11/17/2018 0913   WBC 8.9 11/16/2014 1625   RBC 4.63 11/17/2018 0913   RBC 4.34 11/16/2014 1625   HGB 13.1 11/17/2018 0913   HCT 40.6 11/17/2018 0913   PLT 340 11/17/2018 0913   MCV 88 11/17/2018 0913   MCH 28.3 11/17/2018 0913   MCH 27.0 11/16/2014 1625   MCHC 32.3 11/17/2018 0913   MCHC 31.8 11/16/2014 1625   RDW 14.3 11/17/2018 0913   LYMPHSABS 1.9 11/17/2018 0913   EOSABS 0.2 11/17/2018 0913   BASOSABS 0.0 11/17/2018 0913   Iron/TIBC/Ferritin/ %Sat    Component Value Date/Time   FERRITIN 21 02/22/2016 0823   Lipid Panel     Component Value Date/Time   CHOL 174 11/17/2018 0913   TRIG  78 11/17/2018 0913   HDL 40 11/17/2018 0913   CHOLHDL 3.6 02/22/2016 0823   CHOLHDL 4.6 01/20/2014 1317   VLDL 22 01/20/2014 1317   LDLCALC 118 (H) 11/17/2018  0913   Hepatic Function Panel     Component Value Date/Time   PROT 7.1 11/17/2018 0913   ALBUMIN 4.1 11/17/2018 0913   AST 18 11/17/2018 0913   ALT 17 11/17/2018 0913   ALKPHOS 63 11/17/2018 0913   BILITOT 0.4 11/17/2018 0913   BILIDIR 0.09 02/22/2016 0823   IBILI 0.2 01/20/2014 1317      Component Value Date/Time   TSH 2.220 12/26/2016 0957   TSH 3.130 02/22/2016 0823   TSH 2.880 01/26/2015 0823   Results for SHAKIRAH, KIRKEY (MRN [id number]) as of 02/08/2019 15:14  Ref. Range 11/17/2018 09:13  Vitamin D, 25-Hydroxy Latest Ref Range: 30.0 - 100.0 ng/mL 34.6    I, Marianna Payment, am acting as Energy manager for Debbra Riding, MD  I have reviewed the above documentation for accuracy and completeness, and I agree with the above. - Debbra Riding, MD

## 2019-02-16 DIAGNOSIS — H5203 Hypermetropia, bilateral: Secondary | ICD-10-CM | POA: Diagnosis not present

## 2019-02-18 ENCOUNTER — Other Ambulatory Visit: Payer: Self-pay

## 2019-02-18 ENCOUNTER — Ambulatory Visit (INDEPENDENT_AMBULATORY_CARE_PROVIDER_SITE_OTHER): Payer: 59 | Admitting: Family Medicine

## 2019-02-18 ENCOUNTER — Encounter: Payer: Self-pay | Admitting: Family Medicine

## 2019-02-18 VITALS — BP 123/74 | HR 88 | Temp 98.1°F | Ht 67.0 in | Wt 256.0 lb

## 2019-02-18 DIAGNOSIS — R0602 Shortness of breath: Secondary | ICD-10-CM | POA: Diagnosis not present

## 2019-02-18 DIAGNOSIS — Z6841 Body Mass Index (BMI) 40.0 and over, adult: Secondary | ICD-10-CM

## 2019-02-18 DIAGNOSIS — Z9189 Other specified personal risk factors, not elsewhere classified: Secondary | ICD-10-CM

## 2019-02-18 DIAGNOSIS — E559 Vitamin D deficiency, unspecified: Secondary | ICD-10-CM

## 2019-02-18 DIAGNOSIS — E8881 Metabolic syndrome: Secondary | ICD-10-CM | POA: Diagnosis not present

## 2019-02-23 NOTE — Progress Notes (Signed)
Office: [phone]  /  Fax: [phone]   HPI:   Chief Complaint: OBESITY Christina Rodriguez is here to discuss her progress with her obesity treatment plan. She is on the keep a food journal with 1500 calories and 90+ grams of protein daily and is following her eating plan approximately 75 % of the time. She states she is walking 30-60 minutes 2 times per week. Christina Rodriguez has returned to our clinic for the first time since 07/14/2018. She took a week off thus far. She is currently planning on just work until Thanksgiving. She has made a few indulgences the past week. Her birthday is in 2 days. She is going [place] the mountains this weekend.  Her weight is 256 lb (116.1 kg) today and has gained 10 lbs since her last visit. She has lost 4 lbs since starting treatment with [place].  Shortness of Breath with exertion Christina Rodriguez notes increasing shortness of breath with exercising. She reports her symptoms are similar to her first appointment. She is still able to lay down flat. She notes getting out of breath sooner with activity than she used to. This has not gotten worse recently. Christina Rodriguez denies shortness of breath at rest or orthopnea.  Vitamin D Deficiency Christina Rodriguez has a diagnosis of vitamin D deficiency. She is currently taking prescription Vit D. She notes fatigue and denies nausea, vomiting or muscle weakness.  At risk for osteopenia and osteoporosis Christina Rodriguez is at higher risk of osteopenia and osteoporosis due to vitamin D deficiency.   Insulin Resistance Christina Rodriguez has a diagnosis of insulin resistance based on her elevated fasting insulin level >5. Last Hgb A1c was of 5.5 and insulin of 24.5. Although Christina Rodriguez's blood glucose readings are still under good control, insulin resistance puts her at greater risk of metabolic syndrome and diabetes. She denies GI side effects of Victoza and continues to work on diet and exercise to decrease risk of diabetes.  ASSESSMENT AND PLAN:  Vitamin D deficiency - Plan: Vitamin D,  Ergocalciferol, (DRISDOL) 1.25 MG (50000 UT) CAPS capsule  Shortness of breath on exertion  At risk for osteoporosis  Class 3 severe obesity with serious comorbidity and body mass index (BMI) of 40.0 to 44.9 in adult, unspecified obesity type (HCC)  PLAN:  Shortness of Breath with Exertion Christina Rodriguez's shortness of breath appears to be obesity related and exercise induced. The indirect calorimeter results showed VO2 of 242 and a REE of 1683. She has agreed to work on weight loss and gradually increase exercise to treat her exercise induced shortness of breath. If Christina Rodriguez follows our instructions and loses weight without improvement of her shortness of breath, we will plan to refer to pulmonology. Christina Rodriguez agrees to this plan.  Vitamin D Deficiency Christina Rodriguez was informed that low vitamin D levels contributes to fatigue and are associated with obesity, breast, and colon cancer. Christina Rodriguez agrees to continue taking prescription Vit D 50,000 IU every week #4 and we will refill for 1 month. She will follow up for routine testing of vitamin D, at least 2-3 times per year. She was informed of the risk of over-replacement of vitamin D and agrees to not increase her dose unless she discusses this with [place] first. Christina Rodriguez agrees to follow up with our clinic in 2 weeks.  At risk for osteopenia and osteoporosis Christina Rodriguez was given extended (15 minutes) osteoporosis prevention counseling today. Christina Rodriguez is at risk for osteopenia and osteoporsis due to her vitamin D deficiency. She was encouraged to take her vitamin D and follow  her higher calcium diet and increase strengthening exercise to help strengthen her bones and decrease her risk of osteopenia and osteoporosis.  Insulin Resistance Christina Rodriguez will continue to work on weight loss, exercise, and decreasing simple carbohydrates in her diet to help decrease the risk of diabetes. We dicussed metformin including benefits and risks. She was informed that eating too many simple  carbohydrates or too many calories at one sitting increases the likelihood of GI side effects. Christina Rodriguez agrees to continue Victoza 1.8 mg SubQ daily #6 pens and we will refill for 1 month. Christina Rodriguez agrees to follow up with our clinic in 2 weeks as directed to monitor her progress.  Obesity Christina Rodriguez is currently in the action stage of change. As such, her goal is to continue with weight loss efforts She has agreed to keep a food journal with 1250-1350 calories and 85+ grams of protein daily Christina Rodriguez has been instructed to work up to a goal of 150 minutes of combined cardio and strengthening exercise per week for weight loss and overall health benefits. We discussed the following Behavioral Modification Strategies today: increasing lean protein intake, increasing vegetables and work on meal planning and easy cooking plans, keeping healthy foods in the home, and planning for success   Christina Rodriguez has agreed to follow up with our clinic in 2 weeks. She was informed of the importance of frequent follow up visits to maximize her success with intensive lifestyle modifications for her multiple health conditions.  ALLERGIES: Allergies  Allergen Reactions  . Beta Adrenergic Blockers Shortness Of Breath  . Shrimp [Shellfish Allergy] Hives  . Soybeans Hives    MEDICATIONS: Current Outpatient Medications on File Prior to Visit  Medication Sig Dispense Refill  . albuterol (VENTOLIN HFA) 108 (90 Base) MCG/ACT inhaler Inhale 2 puffs into the lungs every 4 (four) hours as needed for wheezing or shortness of breath. 18 g 5  . Continuous Blood Gluc Receiver (FREESTYLE LIBRE 14 DAY READER) DEVI 18 mg by Does not apply route continuous. 1 Device 0  . Continuous Blood Gluc Sensor (FREESTYLE LIBRE 14 DAY SENSOR) MISC 18 mg by Does not apply route continuous. 2 each 11  . cyanocobalamin 500 MCG tablet Take 500 mcg by mouth daily.    Marland Kitchen diltiazem (CARDIZEM CD) 240 MG 24 hr capsule Take 1 capsule (240 mg total) by mouth daily.  90 capsule 1  . EPINEPHrine 0.3 mg/0.3 mL IJ SOAJ injection Inject 0.3 mg into the muscle once. Reported on 05/03/2015    . fluconazole (DIFLUCAN) 150 MG tablet Take one tablet by mouth three days apart 2 tablet 0  . Fluticasone-Salmeterol (ADVAIR DISKUS) 250-50 MCG/DOSE AEPB Inhale 1 puff into the lungs 2 (two) times daily. 60 each 5  . hydrochlorothiazide (HYDRODIURIL) 25 MG tablet Take 1 tablet (25 mg total) by mouth daily. 90 tablet 1  . ibuprofen (ADVIL,MOTRIN) 200 MG tablet Take 400 mg by mouth every 6 (six) hours as needed for headache, mild pain or moderate pain.    . Insulin Pen Needle (PEN NEEDLES) 32G X 5 MM MISC Inject 1.8 mg into the skin daily. 100 each 3  . liraglutide (VICTOZA) 18 MG/3ML SOPN Take  1.8 mg subcu qd (Patient taking differently: 2.4 mg. Take  2.4 mg subcu qd) 27 mL 1  . metFORMIN (GLUCOPHAGE-XR) 500 MG 24 hr tablet Take 2 tablets (1,000 mg total) by mouth daily with breakfast. 180 tablet 1  . Multiple Vitamins-Minerals (MULTIVITAMIN ADULT PO) Take 1 tablet by mouth daily.    Marland Kitchen  pantoprazole (PROTONIX) 40 MG tablet Take 1 tablet (40 mg total) by mouth daily. 90 tablet 1  . rizatriptan (MAXALT-MLT) 10 MG disintegrating tablet Take 1 tablet (10 mg total) by mouth as needed for migraine. May repeat in 2 hours if needed; max 2 per 24 hours 27 tablet 1  . tiZANidine (ZANAFLEX) 4 MG capsule Take one capsule by mouth three times daily 30 capsule 0  . triamcinolone cream (KENALOG) 0.1 % Apply 1 application topically 2 (two) times daily. 45 g 4  . Vitamin D, Ergocalciferol, (DRISDOL) 1.25 MG (50000 UT) CAPS capsule Take 1 capsule (50,000 Units total) by mouth every 3 (three) days. 10 capsule 0   No current facility-administered medications on file prior to visit.     PAST MEDICAL HISTORY: Past Medical History:  Diagnosis Date  . Adenomyosis   . Anemia    HIstory of   . Asthma    albuterol inhaler prn-recent uri-using more freq  . Dysrhythmia    history of svt-controlled  on cardizem-diagnosed in 1999-  . Gall bladder disease   . GERD (gastroesophageal reflux disease)    uses protonix  . Headache    migraines   . History of polycystic ovarian disease   . Infertility associated with anovulation   . Metabolic syndrome   . Multiple food allergies    shrimp  . Osteoarthritis   . Palpitations   . PCOS (polycystic ovarian syndrome)   . PONV (postoperative nausea and vomiting)   . Prediabetes   . Recurrent upper respiratory infection (URI)    resolving  . SVT (supraventricular tachycardia) (HCC)    History of   . Vitamin D deficiency   . VSD (ventricular septal defect)    history of -no problems    PAST SURGICAL HISTORY: Past Surgical History:  Procedure Laterality Date  . CESAREAN SECTION  2010  . CESAREAN SECTION  11/16/2010   Procedure: CESAREAN SECTION;  Surgeon: Almon Hercules;  Location: [facility];  Service: Gynecology;  Laterality: N/A;  Repeat   . CHOLECYSTECTOMY  2003  . DILATION AND CURETTAGE OF UTERUS    . HYSTEROSCOPY W/D&C N/A 12/06/2014   Procedure: DILATATION AND CURETTAGE /HYSTEROSCOPY;  Surgeon: Waynard Reeds, MD;  Location: [facility];  Service: Gynecology;  Laterality: N/A;  . uterine polyps      SOCIAL HISTORY: Social History   Tobacco Use  . Smoking status: Never Smoker  . Smokeless tobacco: Never Used  Substance Use Topics  . Alcohol use: No  . Drug use: No    FAMILY HISTORY: Family History  Problem Relation Age of Onset  . Diabetes Father   . Hypertension Father   . Hyperlipidemia Father   . Sleep apnea Father   . Obesity Father     ROS: Review of Systems  Constitutional: Positive for malaise/fatigue. Negative for weight loss.  Respiratory: Positive for shortness of breath (with exertion).   Cardiovascular: Negative for orthopnea.  Gastrointestinal: Negative for nausea and vomiting.  Musculoskeletal:       Negative muscle weakness    PHYSICAL EXAM: Blood pressure 123/74, pulse 88, temperature 98.1 F (36.7 C),  temperature source Oral, height 5\' 7"  (1.702 m), weight 256 lb (116.1 kg), last menstrual period 02/18/2019, SpO2 99 %. Body mass index is 40.1 kg/m. Physical Exam Vitals signs reviewed.  Constitutional:      Appearance: Normal appearance. She is obese.  Cardiovascular:     Rate and Rhythm: Normal rate.     Pulses: Normal pulses.  Pulmonary:     Effort: Pulmonary effort is normal.     Breath sounds: Normal breath sounds.  Musculoskeletal: Normal range of motion.  Skin:    General: Skin is warm and dry.  Neurological:     Mental Status: She is alert and oriented to person, place, and time.  Psychiatric:        Mood and Affect: Mood normal.        Behavior: Behavior normal.     RECENT LABS AND TESTS: BMET    Component Value Date/Time   NA 140 11/17/2018 0913   K 4.4 11/17/2018 0913   CL 102 11/17/2018 0913   CO2 22 11/17/2018 0913   GLUCOSE 84 11/17/2018 0913   GLUCOSE 127 (H) 11/16/2014 1625   BUN 19 11/17/2018 0913   CREATININE 0.65 11/17/2018 0913   CREATININE 0.62 01/20/2014 1317   CALCIUM 9.3 11/17/2018 0913   GFRNONAA 110 11/17/2018 0913   GFRAA 127 11/17/2018 0913   Lab Results  Component Value Date   HGBA1C 5.5 11/17/2018   HGBA1C 5.4 07/14/2018   HGBA1C 5.2 02/18/2018   HGBA1C 5.4 09/10/2017   HGBA1C 5.5 04/23/2017   Lab Results  Component Value Date   INSULIN 24.5 11/17/2018   INSULIN 34.1 (H) 07/14/2018   INSULIN 16.7 02/18/2018   INSULIN 24.7 09/10/2017   INSULIN 24.6 04/23/2017   CBC    Component Value Date/Time   WBC 7.4 11/17/2018 0913   WBC 8.9 11/16/2014 1625   RBC 4.63 11/17/2018 0913   RBC 4.34 11/16/2014 1625   HGB 13.1 11/17/2018 0913   HCT 40.6 11/17/2018 0913   PLT 340 11/17/2018 0913   MCV 88 11/17/2018 0913   MCH 28.3 11/17/2018 0913   MCH 27.0 11/16/2014 1625   MCHC 32.3 11/17/2018 0913   MCHC 31.8 11/16/2014 1625   RDW 14.3 11/17/2018 0913   LYMPHSABS 1.9 11/17/2018 0913   EOSABS 0.2 11/17/2018 0913   BASOSABS 0.0  11/17/2018 0913   Iron/TIBC/Ferritin/ %Sat    Component Value Date/Time   FERRITIN 21 02/22/2016 0823   Lipid Panel     Component Value Date/Time   CHOL 174 11/17/2018 0913   TRIG 78 11/17/2018 0913   HDL 40 11/17/2018 0913   CHOLHDL 3.6 02/22/2016 0823   CHOLHDL 4.6 01/20/2014 1317   VLDL 22 01/20/2014 1317   LDLCALC 118 (H) 11/17/2018 0913   Hepatic Function Panel     Component Value Date/Time   PROT 7.1 11/17/2018 0913   ALBUMIN 4.1 11/17/2018 0913   AST 18 11/17/2018 0913   ALT 17 11/17/2018 0913   ALKPHOS 63 11/17/2018 0913   BILITOT 0.4 11/17/2018 0913   BILIDIR 0.09 02/22/2016 0823   IBILI 0.2 01/20/2014 1317      Component Value Date/Time   TSH 2.220 12/26/2016 0957   TSH 3.130 02/22/2016 0823   TSH 2.880 01/26/2015 0823      OBESITY BEHAVIORAL INTERVENTION VISIT  Today's visit was # 36   Starting weight: 260 lbs Starting date: 12/26/16 Today's weight : 256 lbs Today's date: 02/18/2019 Total lbs lost to date: 4    ASK: We discussed the diagnosis of obesity with Elsbeth S Ahlberg today and Verneda agreed to give us permission to discuss obesity behavioral modification therapy today.  ASSESS: Mylo has the diagnosis of obesity and her BMI today is 40.09 Leocadia is in the action stage of change   ADVISE: Peggye was educated on the multiple health risks of obesity as well as  the benefit of weight loss to improve her health. She was advised of the need for long term treatment and the importance of lifestyle modifications to improve her current health and to decrease her risk of future health problems.  AGREE: Multiple dietary modification options and treatment options were discussed and  Lashuna agreed to follow the recommendations documented in the above note.  ARRANGE: Samaya was educated on the importance of frequent visits to treat obesity as outlined per CMS and USPSTF guidelines and agreed to schedule her next follow up appointment today.  I,  Burt Knack, am acting as transcriptionist for Debbra Riding, MD  I have reviewed the above documentation for accuracy and completeness, and I agree with the above. - Debbra Riding, MD

## 2019-02-24 MED ORDER — VITAMIN D (ERGOCALCIFEROL) 1.25 MG (50000 UNIT) PO CAPS: 50000.0000 [IU] | ORAL_CAPSULE | ORAL | 0 refills | Status: DC

## 2019-02-24 MED ORDER — LIRAGLUTIDE 18 MG/3ML ~~LOC~~ SOPN: 1.8000 mg | PEN_INJECTOR | Freq: Every morning | SUBCUTANEOUS | 0 refills | Status: DC

## 2019-03-10 ENCOUNTER — Encounter (INDEPENDENT_AMBULATORY_CARE_PROVIDER_SITE_OTHER): Payer: Self-pay | Admitting: Family Medicine

## 2019-03-10 ENCOUNTER — Other Ambulatory Visit: Payer: Self-pay

## 2019-03-10 ENCOUNTER — Telehealth (INDEPENDENT_AMBULATORY_CARE_PROVIDER_SITE_OTHER): Payer: 59 | Admitting: Family Medicine

## 2019-03-10 DIAGNOSIS — Z6841 Body Mass Index (BMI) 40.0 and over, adult: Secondary | ICD-10-CM | POA: Diagnosis not present

## 2019-03-10 DIAGNOSIS — R7303 Prediabetes: Secondary | ICD-10-CM | POA: Diagnosis not present

## 2019-03-10 MED ORDER — PEN NEEDLES 32G X 5 MM MISC: 1.8000 mg | Freq: Every day | 0 refills | Status: DC

## 2019-03-10 MED ORDER — LIRAGLUTIDE 18 MG/3ML ~~LOC~~ SOPN: 2.4000 mg | PEN_INJECTOR | Freq: Every morning | SUBCUTANEOUS | 0 refills | Status: DC

## 2019-03-10 MED FILL — UNIFINE PENTIPS 32GX5/32: 32G X 4 MM | 90 days supply | Qty: 100 | Fill #0

## 2019-03-10 MED FILL — UNIFINE PENTIPS 32GX5/32": 32G X 4 MM | 90 days supply | Qty: 100 | Fill #0

## 2019-03-10 NOTE — Progress Notes (Signed)
Office: [phone]  /  Fax: [phone] TeleHealth Visit:  Christina Rodriguez has verbally consented to this TeleHealth visit today. The patient is located at home, the provider is located at the [facility] office. The participants in this visit include the listed provider and patient. Christina Rodriguez was unable to use realtime audiovisual technology today and the telehealth visit was conducted via telephone.   HPI:   Chief Complaint: OBESITY Christina Rodriguez is here to discuss her progress with her obesity treatment plan. She is on the keep a food journal with 1250-1350 calories and 85+ grams of protein daily and is following her eating plan approximately 90 % of the time. She states she is walking for 30 minutes 2-3 times per week. Christina Rodriguez continues to do well with weight loss. She is journaling most days and working on meal prepping, but notes increased work and home stress. She feels she has lost 1 lb since her last visit.  We were unable to weigh the patient today for this TeleHealth visit. She feels as if she has lost 1 lb since her last visit. She has lost 4-5 lbs since starting treatment with [place].  Pre-Diabetes Christina Rodriguez has a diagnosis of pre-diabetes based on her elevated Hgb A1c and was informed this puts her at greater risk of developing diabetes. She is stable on medications, and is working on diet and exercise to decrease risk of diabetes. She denies nausea, vomiting, or hypoglycemia even with increased dose of Victoza.  ASSESSMENT AND PLAN:  Prediabetes - Plan: liraglutide (VICTOZA) 18 MG/3ML SOPN, Insulin Pen Needle (PEN NEEDLES) 32G X 5 MM MISC  Class 3 severe obesity with serious comorbidity and body mass index (BMI) of 40.0 to 44.9 in adult, unspecified obesity type [facility])  PLAN:  Pre-Diabetes Christina Rodriguez will continue to work on weight loss, exercise, and decreasing simple carbohydrates in her diet to help decrease the risk of diabetes. We dicussed metformin including benefits and  risks. She was informed that eating too many simple carbohydrates or too many calories at one sitting increases the likelihood of GI side effects. Ardell agrees to continue Victoza 2.4 mg SubQ q AM 12 ML, and we will refill for 1 month, and we will refill nano needles #100 with no refills. Christina Rodriguez agrees to follow up with our clinic in 3 to 4 weeks as directed to monitor her progress.  I spent > than 50% of the 23 minute visit on counseling as documented in the note.  Obesity Christina Rodriguez is currently in the action stage of change. As such, her goal is to continue with weight loss efforts She has agreed to keep a food journal with 1250-1350 calories and 85+ grams of protein daily Christina Rodriguez has been instructed to work up to a goal of 150 minutes of combined cardio and strengthening exercise per week for weight loss and overall health benefits. We discussed the following Behavioral Modification Strategies today: increasing lean protein intake and work on meal planning and easy cooking plans   Christina Rodriguez has agreed to follow up with our clinic in 3 to 4 weeks with myself. She was informed of the importance of frequent follow up visits to maximize her success with intensive lifestyle modifications for her multiple health conditions.  ALLERGIES: Allergies  Allergen Reactions   Beta Adrenergic Blockers Shortness Of Breath   Shrimp [Shellfish Allergy] Hives   Soybeans Hives    MEDICATIONS: Current Outpatient Medications on File Prior to Visit  Medication Sig Dispense Refill   albuterol (VENTOLIN  HFA) 108 (90 Base) MCG/ACT inhaler Inhale 2 puffs into the lungs every 4 (four) hours as needed for wheezing or shortness of breath. 18 g 5   Continuous Blood Gluc Receiver (FREESTYLE LIBRE 14 DAY READER) DEVI 18 mg by Does not apply route continuous. 1 Device 0   Continuous Blood Gluc Sensor (FREESTYLE LIBRE 14 DAY SENSOR) MISC 18 mg by Does not apply route continuous. 2 each 11   cyanocobalamin 500 MCG tablet  Take 500 mcg by mouth daily.     diltiazem (CARDIZEM CD) 240 MG 24 hr capsule Take 1 capsule (240 mg total) by mouth daily. 90 capsule 1   EPINEPHrine 0.3 mg/0.3 mL IJ SOAJ injection Inject 0.3 mg into the muscle once. Reported on 05/03/2015     fluconazole (DIFLUCAN) 150 MG tablet Take one tablet by mouth three days apart 2 tablet 0   Fluticasone-Salmeterol (ADVAIR DISKUS) 250-50 MCG/DOSE AEPB Inhale 1 puff into the lungs 2 (two) times daily. 60 each 5   hydrochlorothiazide (HYDRODIURIL) 25 MG tablet Take 1 tablet (25 mg total) by mouth daily. 90 tablet 1   ibuprofen (ADVIL,MOTRIN) 200 MG tablet Take 400 mg by mouth every 6 (six) hours as needed for headache, mild pain or moderate pain.     metFORMIN (GLUCOPHAGE-XR) 500 MG 24 hr tablet Take 2 tablets (1,000 mg total) by mouth daily with breakfast. 180 tablet 1   Multiple Vitamins-Minerals (MULTIVITAMIN ADULT PO) Take 1 tablet by mouth daily.     pantoprazole (PROTONIX) 40 MG tablet Take 1 tablet (40 mg total) by mouth daily. 90 tablet 1   rizatriptan (MAXALT-MLT) 10 MG disintegrating tablet Take 1 tablet (10 mg total) by mouth as needed for migraine. May repeat in 2 hours if needed; max 2 per 24 hours 27 tablet 1   tiZANidine (ZANAFLEX) 4 MG capsule Take one capsule by mouth three times daily 30 capsule 0   triamcinolone cream (KENALOG) 0.1 % Apply 1 application topically 2 (two) times daily. 45 g 4   Vitamin D, Ergocalciferol, (DRISDOL) 1.25 MG (50000 UT) CAPS capsule Take 1 capsule (50,000 Units total) by mouth every 7 (seven) days. 4 capsule 0   No current facility-administered medications on file prior to visit.     PAST MEDICAL HISTORY: Past Medical History:  Diagnosis Date   Adenomyosis    Anemia    HIstory of    Asthma    albuterol inhaler prn-recent uri-using more freq   Dysrhythmia    history of svt-controlled on cardizem-diagnosed in [place] bladder disease    GERD (gastroesophageal reflux disease)     uses protonix   Headache    migraines    History of polycystic ovarian disease    Infertility associated with anovulation    Metabolic syndrome    Multiple food allergies    shrimp   Osteoarthritis    Palpitations    PCOS (polycystic ovarian syndrome)    PONV (postoperative nausea and vomiting)    Prediabetes    Recurrent upper respiratory infection (URI)    resolving   SVT (supraventricular tachycardia) (HCC)    History of    Vitamin D deficiency    VSD (ventricular septal defect)    history of -no problems    PAST SURGICAL HISTORY: Past Surgical History:  Procedure Laterality Date   CESAREAN SECTION  2010   CESAREAN SECTION  11/16/2010   Procedure: CESAREAN SECTION;  Surgeon: Marcial Pacas;  Location: [facility];  Service: Gynecology;  Laterality: N/A;  Repeat    CHOLECYSTECTOMY  2003   DILATION AND CURETTAGE OF UTERUS     HYSTEROSCOPY W/D&C N/A 12/06/2014   Procedure: DILATATION AND CURETTAGE /HYSTEROSCOPY;  Surgeon: [name], MD;  Location: [facility];  Service: Gynecology;  Laterality: N/A;   uterine polyps      SOCIAL HISTORY: Social History   Tobacco Use   Smoking status: Never Smoker   Smokeless tobacco: Never Used  Substance Use Topics   Alcohol use: No   Drug use: No    FAMILY HISTORY: Family History  Problem Relation Age of Onset   Diabetes Father    Hypertension Father    Hyperlipidemia Father    Sleep apnea Father    Obesity Father     ROS: Review of Systems  Constitutional: Positive for weight loss.  Gastrointestinal: Negative for nausea and vomiting.  Endo/Heme/Allergies:       Negative hypoglycemia    PHYSICAL EXAM: Pt in no acute distress  RECENT LABS AND TESTS: BMET    Component Value Date/Time   NA 140 11/17/2018 0913   K 4.4 11/17/2018 0913   CL 102 11/17/2018 0913   CO2 22 11/17/2018 0913   GLUCOSE 84 11/17/2018 0913   GLUCOSE 127 (H) 11/16/2014 1625   BUN 19 11/17/2018 0913   CREATININE 0.65  11/17/2018 0913   CREATININE 0.62 01/20/2014 1317   CALCIUM 9.3 11/17/2018 0913   GFRNONAA 110 11/17/2018 0913   GFRAA 127 11/17/2018 0913   Lab Results  Component Value Date   HGBA1C 5.5 11/17/2018   HGBA1C 5.4 07/14/2018   HGBA1C 5.2 02/18/2018   HGBA1C 5.4 09/10/2017   HGBA1C 5.5 04/23/2017   Lab Results  Component Value Date   INSULIN 24.5 11/17/2018   INSULIN 34.1 (H) 07/14/2018   INSULIN 16.7 02/18/2018   INSULIN 24.7 09/10/2017   INSULIN 24.6 04/23/2017   CBC    Component Value Date/Time   WBC 7.4 11/17/2018 0913   WBC 8.9 11/16/2014 1625   RBC 4.63 11/17/2018 0913   RBC 4.34 11/16/2014 1625   HGB 13.1 11/17/2018 0913   HCT 40.6 11/17/2018 0913   PLT 340 11/17/2018 0913   MCV 88 11/17/2018 0913   MCH 28.3 11/17/2018 0913   MCH 27.0 11/16/2014 1625   MCHC 32.3 11/17/2018 0913   MCHC 31.8 11/16/2014 1625   RDW 14.3 11/17/2018 0913   LYMPHSABS 1.9 11/17/2018 0913   EOSABS 0.2 11/17/2018 0913   BASOSABS 0.0 11/17/2018 0913   Iron/TIBC/Ferritin/ %Sat    Component Value Date/Time   FERRITIN 21 02/22/2016 0823   Lipid Panel     Component Value Date/Time   CHOL 174 11/17/2018 0913   TRIG 78 11/17/2018 0913   HDL 40 11/17/2018 0913   CHOLHDL 3.6 02/22/2016 0823   CHOLHDL 4.6 01/20/2014 1317   VLDL 22 01/20/2014 1317   LDLCALC 118 (H) 11/17/2018 0913   Hepatic Function Panel     Component Value Date/Time   PROT 7.1 11/17/2018 0913   ALBUMIN 4.1 11/17/2018 0913   AST 18 11/17/2018 0913   ALT 17 11/17/2018 0913   ALKPHOS 63 11/17/2018 0913   BILITOT 0.4 11/17/2018 0913   BILIDIR 0.09 02/22/2016 0823   IBILI 0.2 01/20/2014 1317      Component Value Date/Time   TSH 2.220 12/26/2016 0957   TSH 3.130 02/22/2016 0823   TSH 2.880 01/26/2015 0823      I, Burt KnackSharon Martin, am acting as transcriptionist for Quillian Quincearen Daivon Rayos, MD I  have reviewed the above documentation for accuracy and completeness, and I agree with the above. -Quillian Quince, MD

## 2019-03-11 ENCOUNTER — Ambulatory Visit (INDEPENDENT_AMBULATORY_CARE_PROVIDER_SITE_OTHER): Payer: 59 | Admitting: Family Medicine

## 2019-03-17 MED FILL — PANTOPRAZOLE SOD DR 40 MG T: 40 | 90 days supply | Qty: 90 | Fill #1

## 2019-03-17 MED FILL — UNIFINE PENTIPS 31GX3/16": 31G X 5 MM | 90 days supply | Qty: 100 | Fill #0

## 2019-03-17 MED FILL — HYDROCHLOROTHIAZIDE 25 MG T: 25 | 90 days supply | Qty: 90 | Fill #1

## 2019-03-17 MED FILL — VIT D2 1.25 MG (50,000 UNIT: 1.25 MG | 28 days supply | Qty: 4 | Fill #0

## 2019-03-17 MED FILL — metFORMIN HCL ER 500 MG TB2: 500 | 30 days supply | Qty: 60 | Fill #2

## 2019-03-17 MED FILL — VICTOZA 18 MG/3 ML INJECT P: 18 | 90 days supply | Qty: 27 | Fill #1

## 2019-03-17 MED FILL — UNIFINE PENTIPS 31GX3/16: 31G X 5 MM | 90 days supply | Qty: 100 | Fill #0

## 2019-03-24 ENCOUNTER — Other Ambulatory Visit (INDEPENDENT_AMBULATORY_CARE_PROVIDER_SITE_OTHER): Payer: Self-pay

## 2019-03-24 ENCOUNTER — Encounter (INDEPENDENT_AMBULATORY_CARE_PROVIDER_SITE_OTHER): Payer: Self-pay

## 2019-03-24 ENCOUNTER — Encounter (INDEPENDENT_AMBULATORY_CARE_PROVIDER_SITE_OTHER): Payer: Self-pay | Admitting: Family Medicine

## 2019-03-24 MED ORDER — SAXENDA 18 MG/3ML ~~LOC~~ SOPN: 3.0000 mg | PEN_INJECTOR | Freq: Every day | SUBCUTANEOUS | 0 refills | Status: DC

## 2019-03-25 ENCOUNTER — Other Ambulatory Visit (INDEPENDENT_AMBULATORY_CARE_PROVIDER_SITE_OTHER): Payer: Self-pay

## 2019-03-25 DIAGNOSIS — R7303 Prediabetes: Secondary | ICD-10-CM

## 2019-03-25 MED ORDER — LIRAGLUTIDE 18 MG/3ML ~~LOC~~ SOPN: 1.8000 mg | PEN_INJECTOR | Freq: Every day | SUBCUTANEOUS | 0 refills | Status: DC

## 2019-03-25 NOTE — Telephone Encounter (Signed)
ok 

## 2019-03-25 NOTE — Telephone Encounter (Signed)
Please advise 

## 2019-04-07 ENCOUNTER — Other Ambulatory Visit: Payer: Self-pay

## 2019-04-07 ENCOUNTER — Encounter (INDEPENDENT_AMBULATORY_CARE_PROVIDER_SITE_OTHER): Payer: Self-pay | Admitting: Family Medicine

## 2019-04-07 ENCOUNTER — Telehealth (INDEPENDENT_AMBULATORY_CARE_PROVIDER_SITE_OTHER): Payer: 59 | Admitting: Family Medicine

## 2019-04-07 ENCOUNTER — Other Ambulatory Visit: Payer: Self-pay | Admitting: Nurse Practitioner

## 2019-04-07 DIAGNOSIS — E8881 Metabolic syndrome: Secondary | ICD-10-CM

## 2019-04-07 DIAGNOSIS — Z6841 Body Mass Index (BMI) 40.0 and over, adult: Secondary | ICD-10-CM | POA: Diagnosis not present

## 2019-04-07 MED ORDER — TRIAMCINOLONE ACETONIDE 0.1 % MT PSTE: PASTE | OROMUCOSAL | 0 refills | Status: DC

## 2019-04-07 NOTE — Progress Notes (Signed)
Office: [phone]  /  Fax: [phone] TeleHealth Visit:  Christina SheffieldAutumn S Rodriguez has verbally consented to this TeleHealth visit today. The patient is located at work, the provider is located at the [facility] office. The participants in this visit include the listed provider and patient. The visit was conducted today via FaceTime.  HPI:   Chief Complaint: OBESITY Christina Rodriguez is here to discuss her progress with her obesity treatment plan. She is keeping a food journal with 1400 calories and 85 grams of protein and is following her eating plan approximately 90% of the time. She states she is walking 30 minutes 2-3 days per week. Christina Rodriguez feels she is doing well avoiding weight gain over Thanksgiving. She notes less temptations at work than normal due to COVID-19. Hunger is controlled. Work is getting busy (patient is an [employer] fund[employer]) and she thinks she should work on meal planning to help keep her on track this month. We were unable to weigh the patient today for this TeleHealth visit. She feels as if she has maintained her weight since her last visit. She has lost 4 lbs since starting treatment with us.  Insulin Resistance Christina Rodriguez has a diagnosis of insulin resistance based on her elevated fasting insulin level >5. Last A1c was 5.5 on 11/17/2018. Although Christina Rodriguez's blood glucose readings are still under good control, insulin resistance puts her at greater risk of metabolic syndrome and diabetes. She is tolerating Victoza well and is working on diet and exercise to decrease risk of diabetes.  ASSESSMENT AND PLAN:  Insulin resistance  Class 3 severe obesity with serious comorbidity and body mass index (BMI) of 40.0 to 44.9 in adult, unspecified obesity type (HCC)  PLAN:  Insulin Resistance Christina Rodriguez will continue to work on weight loss, exercise, and decreasing simple carbohydrates in her diet to help decrease the risk of diabetes. We dicussed metformin including benefits and risks. She was informed  that eating too many simple carbohydrates or too many calories at one sitting increases the likelihood of GI side effects. Christina Rodriguez will continue Victoza as is. We discussed the importance of diet and weight loss.  I spent > than 50% of the 20 minute visit on counseling as documented in the note.   TIME SPENT: 12 minutes  Obesity Christina Rodriguez is currently in the action stage of change. As such, her goal is to continue with weight loss efforts. She has agreed to keep a food journal with 1400 calories and 85 grams of protein.  Christina Rodriguez has been instructed to work up to a goal of 150 minutes of combined cardio and strengthening exercise per week for weight loss and overall health benefits. We discussed the following Behavioral Modification Strategies today: work on meal planning and easy cooking plans, and keep a strict food journal.  Christina Rodriguez has agreed to follow up with our clinic in 3-4 weeks. She was informed of the importance of frequent follow up visits to maximize her success with intensive lifestyle modifications for her multiple health conditions.  ALLERGIES: Allergies  Allergen Reactions  . Beta Adrenergic Blockers Shortness Of Breath  . Shrimp [Shellfish Allergy] Hives  . Soybeans Hives    MEDICATIONS: Current Outpatient Medications on File Prior to Visit  Medication Sig Dispense Refill  . albuterol (VENTOLIN HFA) 108 (90 Base) MCG/ACT inhaler Inhale 2 puffs into the lungs every 4 (four) hours as needed for wheezing or shortness of breath. 18 g 5  . Continuous Blood Gluc Receiver (FREESTYLE LIBRE 14 DAY READER) DEVI 18 mg  by Does not apply route continuous. 1 Device 0  . Continuous Blood Gluc Sensor (FREESTYLE LIBRE 14 DAY SENSOR) MISC 18 mg by Does not apply route continuous. 2 each 11  . cyanocobalamin 500 MCG tablet Take 500 mcg by mouth daily.    Marland Kitchen diltiazem (CARDIZEM CD) 240 MG 24 hr capsule Take 1 capsule (240 mg total) by mouth daily. 90 capsule 1  . EPINEPHrine 0.3 mg/0.3 mL IJ  SOAJ injection Inject 0.3 mg into the muscle once. Reported on 05/03/2015    . fluconazole (DIFLUCAN) 150 MG tablet Take one tablet by mouth three days apart 2 tablet 0  . Fluticasone-Salmeterol (ADVAIR DISKUS) 250-50 MCG/DOSE AEPB Inhale 1 puff into the lungs 2 (two) times daily. 60 each 5  . hydrochlorothiazide (HYDRODIURIL) 25 MG tablet Take 1 tablet (25 mg total) by mouth daily. 90 tablet 1  . ibuprofen (ADVIL,MOTRIN) 200 MG tablet Take 400 mg by mouth every 6 (six) hours as needed for headache, mild pain or moderate pain.    . Insulin Pen Needle (PEN NEEDLES) 32G X 5 MM MISC Inject 1.8 mg into the skin daily. Use with Victoza to inject into skin 100 each 0  . liraglutide (VICTOZA) 18 MG/3ML SOPN Inject 0.3 mLs (1.8 mg total) into the skin daily. 9 pen 0  . metFORMIN (GLUCOPHAGE-XR) 500 MG 24 hr tablet Take 2 tablets (1,000 mg total) by mouth daily with breakfast. 180 tablet 1  . Multiple Vitamins-Minerals (MULTIVITAMIN ADULT PO) Take 1 tablet by mouth daily.    . pantoprazole (PROTONIX) 40 MG tablet Take 1 tablet (40 mg total) by mouth daily. 90 tablet 1  . rizatriptan (MAXALT-MLT) 10 MG disintegrating tablet Take 1 tablet (10 mg total) by mouth as needed for migraine. May repeat in 2 hours if needed; max 2 per 24 hours 27 tablet 1  . tiZANidine (ZANAFLEX) 4 MG capsule Take one capsule by mouth three times daily 30 capsule 0  . triamcinolone cream (KENALOG) 0.1 % Apply 1 application topically 2 (two) times daily. 45 g 4  . Vitamin D, Ergocalciferol, (DRISDOL) 1.25 MG (50000 UT) CAPS capsule Take 1 capsule (50,000 Units total) by mouth every 7 (seven) days. 4 capsule 0   No current facility-administered medications on file prior to visit.     PAST MEDICAL HISTORY: Past Medical History:  Diagnosis Date  . Adenomyosis   . Anemia    HIstory of   . Asthma    albuterol inhaler prn-recent uri-using more freq  . Dysrhythmia    history of svt-controlled on cardizem-diagnosed in 1999-  . Gall  bladder disease   . GERD (gastroesophageal reflux disease)    uses protonix  . Headache    migraines   . History of polycystic ovarian disease   . Infertility associated with anovulation   . Metabolic syndrome   . Multiple food allergies    shrimp  . Osteoarthritis   . Palpitations   . PCOS (polycystic ovarian syndrome)   . PONV (postoperative nausea and vomiting)   . Prediabetes   . Recurrent upper respiratory infection (URI)    resolving  . SVT (supraventricular tachycardia) (HCC)    History of   . Vitamin D deficiency   . VSD (ventricular septal defect)    history of -no problems    PAST SURGICAL HISTORY: Past Surgical History:  Procedure Laterality Date  . CESAREAN SECTION  2010  . CESAREAN SECTION  11/16/2010   Procedure: CESAREAN SECTION;  Surgeon: Almon Hercules;  Location: [facility];  Service: Gynecology;  Laterality: N/A;  Repeat   . CHOLECYSTECTOMY  2003  . DILATION AND CURETTAGE OF UTERUS    . HYSTEROSCOPY W/D&C N/A 12/06/2014   Procedure: DILATATION AND CURETTAGE /HYSTEROSCOPY;  Surgeon: Waynard Reeds, MD;  Location: [facility];  Service: Gynecology;  Laterality: N/A;  . uterine polyps      SOCIAL HISTORY: Social History   Tobacco Use  . Smoking status: Never Smoker  . Smokeless tobacco: Never Used  Substance Use Topics  . Alcohol use: No  . Drug use: No    FAMILY HISTORY: Family History  Problem Relation Age of Onset  . Diabetes Father   . Hypertension Father   . Hyperlipidemia Father   . Sleep apnea Father   . Obesity Father    ROS: ROS none noted.  PHYSICAL EXAM: Pt in no acute distress  RECENT LABS AND TESTS: BMET    Component Value Date/Time   NA 140 11/17/2018 0913   K 4.4 11/17/2018 0913   CL 102 11/17/2018 0913   CO2 22 11/17/2018 0913   GLUCOSE 84 11/17/2018 0913   GLUCOSE 127 (H) 11/16/2014 1625   BUN 19 11/17/2018 0913   CREATININE 0.65 11/17/2018 0913   CREATININE 0.62 01/20/2014 1317   CALCIUM 9.3 11/17/2018 0913   GFRNONAA 110  11/17/2018 0913   GFRAA 127 11/17/2018 0913   Lab Results  Component Value Date   HGBA1C 5.5 11/17/2018   HGBA1C 5.4 07/14/2018   HGBA1C 5.2 02/18/2018   HGBA1C 5.4 09/10/2017   HGBA1C 5.5 04/23/2017   Lab Results  Component Value Date   INSULIN 24.5 11/17/2018   INSULIN 34.1 (H) 07/14/2018   INSULIN 16.7 02/18/2018   INSULIN 24.7 09/10/2017   INSULIN 24.6 04/23/2017   CBC    Component Value Date/Time   WBC 7.4 11/17/2018 0913   WBC 8.9 11/16/2014 1625   RBC 4.63 11/17/2018 0913   RBC 4.34 11/16/2014 1625   HGB 13.1 11/17/2018 0913   HCT 40.6 11/17/2018 0913   PLT 340 11/17/2018 0913   MCV 88 11/17/2018 0913   MCH 28.3 11/17/2018 0913   MCH 27.0 11/16/2014 1625   MCHC 32.3 11/17/2018 0913   MCHC 31.8 11/16/2014 1625   RDW 14.3 11/17/2018 0913   LYMPHSABS 1.9 11/17/2018 0913   EOSABS 0.2 11/17/2018 0913   BASOSABS 0.0 11/17/2018 0913   Iron/TIBC/Ferritin/ %Sat    Component Value Date/Time   FERRITIN 21 02/22/2016 0823   Lipid Panel     Component Value Date/Time   CHOL 174 11/17/2018 0913   TRIG 78 11/17/2018 0913   HDL 40 11/17/2018 0913   CHOLHDL 3.6 02/22/2016 0823   CHOLHDL 4.6 01/20/2014 1317   VLDL 22 01/20/2014 1317   LDLCALC 118 (H) 11/17/2018 0913   Hepatic Function Panel     Component Value Date/Time   PROT 7.1 11/17/2018 0913   ALBUMIN 4.1 11/17/2018 0913   AST 18 11/17/2018 0913   ALT 17 11/17/2018 0913   ALKPHOS 63 11/17/2018 0913   BILITOT 0.4 11/17/2018 0913   BILIDIR 0.09 02/22/2016 0823   IBILI 0.2 01/20/2014 1317      Component Value Date/Time   TSH 2.220 12/26/2016 0957   TSH 3.130 02/22/2016 0823   TSH 2.880 01/26/2015 0823   Results for JONASIA, COINER (MRN [id number]) as of 04/07/2019 15:38  Ref. Range 11/17/2018 09:13  Vitamin D, 25-Hydroxy Latest Ref Range: 30.0 - 100.0 ng/mL 34.6   I, Marianna Payment, am  acting as transcriptionist for Dennard Nip, MD I have reviewed the above documentation for accuracy and completeness,  and I agree with the above. -Dennard Nip, MD

## 2019-04-19 ENCOUNTER — Other Ambulatory Visit: Payer: Self-pay | Admitting: Obstetrics and Gynecology

## 2019-04-19 DIAGNOSIS — Z9189 Other specified personal risk factors, not elsewhere classified: Secondary | ICD-10-CM

## 2019-05-12 ENCOUNTER — Other Ambulatory Visit: Payer: Self-pay

## 2019-05-12 ENCOUNTER — Telehealth (INDEPENDENT_AMBULATORY_CARE_PROVIDER_SITE_OTHER): Payer: 59 | Admitting: Family Medicine

## 2019-05-12 ENCOUNTER — Encounter (INDEPENDENT_AMBULATORY_CARE_PROVIDER_SITE_OTHER): Payer: Self-pay | Admitting: Family Medicine

## 2019-05-12 DIAGNOSIS — Z6841 Body Mass Index (BMI) 40.0 and over, adult: Secondary | ICD-10-CM | POA: Diagnosis not present

## 2019-05-12 DIAGNOSIS — E559 Vitamin D deficiency, unspecified: Secondary | ICD-10-CM | POA: Diagnosis not present

## 2019-05-18 NOTE — Progress Notes (Signed)
TeleHealth Visit:  Due to the COVID-19 pandemic, this visit was completed with telemedicine (audio/video) technology to reduce patient and provider exposure as well as to preserve personal protective equipment.   Chrystie YARELY BEBEE has verbally consented to this TeleHealth visit. The patient is located at home, the provider is located at the [employer] [facility] office. The participants in this visit include the listed provider and patient. Najiyah was unable to use realtime audiovisual technology today and the telehealth visit was conducted via telephone.  Chief Complaint: OBESITY Valorie S Kraeger is here to discuss her progress with her obesity treatment plan along with follow-up of her obesity related diagnoses. Diala is keeping a food journal and adhering to recommended goals of 1400 calories and 85 grams of protein daily and states she is following her eating plan approximately 70-75% of the time. Doriann states she is walking for 30-45 minutes 2-3 times per week.  Today's visit was #: 48 Starting weight: 260 lbs Starting date: 12/26/16  Interim History: Lainy did well minimizing weight gain over the holidays. She did some celebration eating, but she has gotten back on track with her journaling this week.  Subjective:   1. Vitamin D deficiency Tuyet is stable on Vit D. Her last labs this summer were not yet at goal. She is due to have labs done soon.  Assessment/Plan:   1. Vitamin D deficiency Low Vitamin D level contributes to fatigue and are associated with obesity, breast, and colon cancer. Rexanna agrees to continue taking prescription Vitamin D and will plan to get labs done in 1-2 months at her next in office visit. She will follow-up for routine testing of vitamin D, at least 2-3 times per year to avoid over-replacement. We will continue to monitor.  2. Class 3 severe obesity with serious comorbidity and body mass index (BMI) of 40.0 to 44.9 in adult, unspecified obesity type  [facility]) Zarai is currently in the action stage of change. As such, her goal is to continue with weight loss efforts. She has agreed to keeping a food journal and adhering to recommended goals of 1400-1500 calories and 90+ grams of protein daily.   We discussed the following exercise goals today: Gae is to try to count her steps at work to get baseline activity level.  We discussed the following behavioral modification strategies today: meal planning and cooking strategies and keeping a strict food journal.  Zebedee Iba Scism has agreed to follow-up with our clinic in 3 weeks. She was informed of the importance of frequent follow-up visits to maximize her success with intensive lifestyle modifications for her multiple health conditions.  Objective:   VITALS: Per patient if applicable, see vitals. GENERAL: Alert and in no acute distress. CARDIOPULMONARY: No increased WOB. Speaking in clear sentences.  PSYCH: Pleasant and cooperative. Speech normal rate and rhythm. Affect is appropriate. Insight and judgement are appropriate. Attention is focused, linear, and appropriate.  NEURO: Oriented as arrived to appointment on time with no prompting.   Lab Results  Component Value Date   CREATININE 0.65 11/17/2018   BUN 19 11/17/2018   NA 140 11/17/2018   K 4.4 11/17/2018   CL 102 11/17/2018   CO2 22 11/17/2018   Lab Results  Component Value Date   ALT 17 11/17/2018   AST 18 11/17/2018   ALKPHOS 63 11/17/2018   BILITOT 0.4 11/17/2018   Lab Results  Component Value Date   HGBA1C 5.5 11/17/2018   HGBA1C 5.4 07/14/2018  HGBA1C 5.2 02/18/2018   HGBA1C 5.4 09/10/2017   HGBA1C 5.5 04/23/2017   Lab Results  Component Value Date   INSULIN 24.5 11/17/2018   INSULIN 34.1 (H) 07/14/2018   INSULIN 16.7 02/18/2018   INSULIN 24.7 09/10/2017   INSULIN 24.6 04/23/2017   Lab Results  Component Value Date   TSH 2.220 12/26/2016   Lab Results  Component Value Date   CHOL 174 11/17/2018   HDL  40 11/17/2018   LDLCALC 118 (H) 11/17/2018   TRIG 78 11/17/2018   CHOLHDL 3.6 02/22/2016   Lab Results  Component Value Date   WBC 7.4 11/17/2018   HGB 13.1 11/17/2018   HCT 40.6 11/17/2018   MCV 88 11/17/2018   PLT 340 11/17/2018   Lab Results  Component Value Date   FERRITIN 21 02/22/2016    Attestation Statements:   Reviewed by clinician on day of visit: allergies, medications, problem list, medical history, surgical history, family history, social history, and previous encounter notes.  Time spent on visit including pre-visit chart review and post-visit care was 20 minutes.   I, Burt Knack, am acting as transcriptionist for Quillian Quince, MD.  I have reviewed the above documentation for accuracy and completeness, and I agree with the above. - Quillian Quince, MD

## 2019-05-20 ENCOUNTER — Other Ambulatory Visit: Payer: 59

## 2019-06-02 ENCOUNTER — Other Ambulatory Visit: Payer: Self-pay

## 2019-06-02 ENCOUNTER — Encounter (INDEPENDENT_AMBULATORY_CARE_PROVIDER_SITE_OTHER): Payer: Self-pay

## 2019-06-02 ENCOUNTER — Encounter (INDEPENDENT_AMBULATORY_CARE_PROVIDER_SITE_OTHER): Payer: Self-pay | Admitting: Family Medicine

## 2019-06-02 ENCOUNTER — Telehealth (INDEPENDENT_AMBULATORY_CARE_PROVIDER_SITE_OTHER): Payer: 59 | Admitting: Family Medicine

## 2019-06-02 DIAGNOSIS — E559 Vitamin D deficiency, unspecified: Secondary | ICD-10-CM | POA: Diagnosis not present

## 2019-06-02 DIAGNOSIS — R7303 Prediabetes: Secondary | ICD-10-CM | POA: Diagnosis not present

## 2019-06-02 DIAGNOSIS — Z6841 Body Mass Index (BMI) 40.0 and over, adult: Secondary | ICD-10-CM | POA: Diagnosis not present

## 2019-06-02 DIAGNOSIS — E7849 Other hyperlipidemia: Secondary | ICD-10-CM

## 2019-06-03 NOTE — Progress Notes (Signed)
TeleHealth Visit:  Due to the COVID-19 pandemic, this visit was completed with telemedicine (audio/video) technology to reduce patient and provider exposure as well as to preserve personal protective equipment.   [place] has verbally consented to this TeleHealth visit. The patient is located at home, the provider is located at the [employer] [facility] office. The participants in this visit include the listed provider and patient. The visit was conducted today via face time.   Chief Complaint: OBESITY Elvenia is here to discuss her progress with her obesity treatment plan along with follow-up of her obesity related diagnoses. Angelena is on keeping a food journal and adhering to recommended goals of 1400-1500 calories and 90+ grams of protein and states she is following her eating plan approximately 90% of the time. Jessieca states she is doing 0 minutes 0 times per week.  Today's visit was #: 40 Starting weight: 260 lbs Starting date: 12/26/16  Interim History: Heba has been extremely busy at work, and she has found meal planning to be suffering. She has increased grab and go eating, but she is still mindful of her choices and has done very well avoiding weight gain.  Subjective:   1. Vitamin D deficiency Jaelah is on Vit D prescription, but level is not yet at goal. She is due for labs.  2. Pre-diabetes Chrissie is stable on metformin and Victoza. Her last Hgb A1c was stable but her fasting insulin was still elevated.  3. Other hyperlipidemia Maite's cholesterol is improving with diet and weight loss. She is not on a statin. She is due for labs.  Assessment/Plan:   1. Vitamin D deficiency Low Vitamin D level contributes to fatigue and are associated with obesity, breast, and colon cancer. Yailyn agreed to continue taking prescription Vitamin D 50,000 IU every week, and we will check labs today. She will follow-up for routine testing of Vitamin D, at least 2-3 times per year to  avoid over-replacement.  - VITAMIN D 25 Hydroxy (Vit-D Deficiency, Fractures)  2. Pre-diabetes Danesha agreed to continue her medications, and she will continue to work on weight loss, exercise, and decreasing simple carbohydrates to help decrease the risk of diabetes. We will check labs today.  - Hemoglobin A1c - Insulin, random  3. Other hyperlipidemia Cardiovascular risk and specific lipid/LDL goals reviewed. We discussed several lifestyle modifications today and Nur will continue to work on diet, exercise and weight loss efforts. We will check labs today. Orders and follow up as documented in patient record.   Counseling Intensive lifestyle modifications are the first line treatment for this issue. . Dietary changes: Increase soluble fiber. Decrease simple carbohydrates. . Exercise changes: Moderate to vigorous-intensity aerobic activity 150 minutes per week if tolerated. . Lipid-lowering medications: see documented in medical record.  - Comprehensive metabolic panel - Lipid Panel With LDL/HDL Ratio  4. Class 3 severe obesity with serious comorbidity and body mass index (BMI) of 40.0 to 44.9 in adult, unspecified obesity type [facility]) Lezli is currently in the action stage of change. As such, her goal is to continue with weight loss efforts. She has agreed to keeping a food journal and adhering to recommended goals of 1500 calories and 90 grams of protein daily.   We will send 100 calorie snack list through MyChart.  Behavioral modification strategies: meal planning and cooking strategies and better snacking choices.  Adamari has agreed to follow-up with our clinic in 3 weeks with myself. She was informed of the importance of frequent follow-up  visits to maximize her success with intensive lifestyle modifications for her multiple health conditions.  Iva was informed we would discuss her lab results at her next visit unless there is a critical issue that needs to be addressed  sooner. Tricia agreed to keep her next visit at the agreed upon time to discuss these results.  Objective:   VITALS: Per patient if applicable, see vitals. GENERAL: Alert and in no acute distress. CARDIOPULMONARY: No increased WOB. Speaking in clear sentences.  PSYCH: Pleasant and cooperative. Speech normal rate and rhythm. Affect is appropriate. Insight and judgement are appropriate. Attention is focused, linear, and appropriate.  NEURO: Oriented as arrived to appointment on time with no prompting.   Lab Results  Component Value Date   CREATININE 0.65 11/17/2018   BUN 19 11/17/2018   NA 140 11/17/2018   K 4.4 11/17/2018   CL 102 11/17/2018   CO2 22 11/17/2018   Lab Results  Component Value Date   ALT 17 11/17/2018   AST 18 11/17/2018   ALKPHOS 63 11/17/2018   BILITOT 0.4 11/17/2018   Lab Results  Component Value Date   HGBA1C 5.5 11/17/2018   HGBA1C 5.4 07/14/2018   HGBA1C 5.2 02/18/2018   HGBA1C 5.4 09/10/2017   HGBA1C 5.5 04/23/2017   Lab Results  Component Value Date   INSULIN 24.5 11/17/2018   INSULIN 34.1 (H) 07/14/2018   INSULIN 16.7 02/18/2018   INSULIN 24.7 09/10/2017   INSULIN 24.6 04/23/2017   Lab Results  Component Value Date   TSH 2.220 12/26/2016   Lab Results  Component Value Date   CHOL 174 11/17/2018   HDL 40 11/17/2018   LDLCALC 118 (H) 11/17/2018   TRIG 78 11/17/2018   CHOLHDL 3.6 02/22/2016   Lab Results  Component Value Date   WBC 7.4 11/17/2018   HGB 13.1 11/17/2018   HCT 40.6 11/17/2018   MCV 88 11/17/2018   PLT 340 11/17/2018   Lab Results  Component Value Date   FERRITIN 21 02/22/2016    Attestation Statements:   Reviewed by clinician on day of visit: allergies, medications, problem list, medical history, surgical history, family history, social history, and previous encounter notes.   I, Trixie Dredge, am acting as transcriptionist for Dennard Nip, MD.  I have reviewed the above documentation for accuracy and  completeness, and I agree with the above. - Dennard Nip, MD

## 2019-06-04 ENCOUNTER — Ambulatory Visit (INDEPENDENT_AMBULATORY_CARE_PROVIDER_SITE_OTHER): Payer: 59 | Admitting: Nurse Practitioner

## 2019-06-04 ENCOUNTER — Other Ambulatory Visit: Payer: Self-pay

## 2019-06-04 DIAGNOSIS — R7303 Prediabetes: Secondary | ICD-10-CM | POA: Diagnosis not present

## 2019-06-04 DIAGNOSIS — D500 Iron deficiency anemia secondary to blood loss (chronic): Secondary | ICD-10-CM | POA: Diagnosis not present

## 2019-06-04 DIAGNOSIS — M25562 Pain in left knee: Secondary | ICD-10-CM

## 2019-06-04 DIAGNOSIS — E8881 Metabolic syndrome: Secondary | ICD-10-CM | POA: Diagnosis not present

## 2019-06-04 DIAGNOSIS — E161 Other hypoglycemia: Secondary | ICD-10-CM | POA: Diagnosis not present

## 2019-06-04 DIAGNOSIS — R5383 Other fatigue: Secondary | ICD-10-CM | POA: Diagnosis not present

## 2019-06-04 DIAGNOSIS — R7989 Other specified abnormal findings of blood chemistry: Secondary | ICD-10-CM

## 2019-06-04 DIAGNOSIS — E559 Vitamin D deficiency, unspecified: Secondary | ICD-10-CM | POA: Diagnosis not present

## 2019-06-05 ENCOUNTER — Encounter: Payer: Self-pay | Admitting: Nurse Practitioner

## 2019-06-05 MED ORDER — MELOXICAM 15 MG PO TABS: 15.0000 mg | ORAL_TABLET | Freq: Every day | ORAL | 0 refills | Status: DC

## 2019-06-05 MED ORDER — PANTOPRAZOLE SODIUM 40 MG PO TBEC: 40.0000 mg | DELAYED_RELEASE_TABLET | Freq: Every day | ORAL | 1 refills | Status: DC

## 2019-06-05 MED FILL — MELOXICAM 15 MG TABLET: 15 | 90 days supply | Qty: 90 | Fill #0

## 2019-06-05 MED FILL — PANTOPRAZOLE SOD DR 40 MG T: 40 | 90 days supply | Qty: 90 | Fill #0

## 2019-06-05 NOTE — Progress Notes (Signed)
Subjective:  Presents requesting lab work for her [facility] [employer] [facility] program through [facility].  Has noticed she has taken more ibuprofen lately due to her chronic left knee pain.  As a result has noticed an increase in her reflux symptoms.  No longer controlled by her OTC meds.  Would like to go back on Protonix.  Also requesting a daily medication as an anti-inflammatory versus taking something at least 2 times per day.  Objective:   There were no vitals taken for this visit. NAD.  Alert, oriented.  Thoughts logical coherent and relevant.  Assessment:   Problem List Items Addressed This Visit      Digestive   Hyperinsulinemia   Relevant Orders   Hemoglobin A1c   Lipid panel   Basic metabolic panel   Insulin, random     Other   Anemia   Relevant Orders   Ferritin   Elevated LFTs   Relevant Orders   Lipid panel   Hepatic function panel   Metabolic syndrome - Primary   Relevant Orders   Hemoglobin A1c   Lipid panel   Basic metabolic panel   Insulin, random   Other fatigue   Relevant Orders   Hepatic function panel   TSH   Basic metabolic panel   Ferritin   Prediabetes   Relevant Orders   Hemoglobin A1c   Lipid panel   Basic metabolic panel   Insulin, random   Vitamin D deficiency   Relevant Orders   VITAMIN D 25 Hydroxy (Vit-D Deficiency, Fractures)    Other Visit Diagnoses    Arthralgia of left knee       Relevant Orders   Sed Rate (ESR)       Plan:   Meds ordered this encounter  Medications  . pantoprazole (PROTONIX) 40 MG tablet    Sig: Take 1 tablet (40 mg total) by mouth daily.    Dispense:  90 tablet    Refill:  1    Order Specific Question:   Supervising Provider    Answer:   Sallee Lange A [[id number]]  . meloxicam (MOBIC) 15 MG tablet    Sig: Take 1 tablet (15 mg total) by mouth daily. Prn knee pain    Dispense:  90 tablet    Refill:  0    Order Specific Question:   Supervising Provider    Answer:   Sallee Lange A [[id number]]   Restart  Pantoprazole as directed. She is also on Victoza which may increase her symptoms. Use Meloxicam as directed as needed. DC if reflux worsens.  Labs pending. Follow up with [facility] as planned.  Gets regular PE with her GYN. Return for follow up as needed .

## 2019-06-07 ENCOUNTER — Encounter: Payer: Self-pay | Admitting: Family Medicine

## 2019-06-11 DIAGNOSIS — E8881 Metabolic syndrome: Secondary | ICD-10-CM | POA: Diagnosis not present

## 2019-06-11 DIAGNOSIS — E161 Other hypoglycemia: Secondary | ICD-10-CM | POA: Diagnosis not present

## 2019-06-11 DIAGNOSIS — D500 Iron deficiency anemia secondary to blood loss (chronic): Secondary | ICD-10-CM | POA: Diagnosis not present

## 2019-06-11 DIAGNOSIS — R7303 Prediabetes: Secondary | ICD-10-CM | POA: Diagnosis not present

## 2019-06-11 DIAGNOSIS — E559 Vitamin D deficiency, unspecified: Secondary | ICD-10-CM | POA: Diagnosis not present

## 2019-06-11 DIAGNOSIS — R7989 Other specified abnormal findings of blood chemistry: Secondary | ICD-10-CM | POA: Diagnosis not present

## 2019-06-11 DIAGNOSIS — M25562 Pain in left knee: Secondary | ICD-10-CM | POA: Diagnosis not present

## 2019-06-11 DIAGNOSIS — R5383 Other fatigue: Secondary | ICD-10-CM | POA: Diagnosis not present

## 2019-06-12 LAB — FERRITIN: Ferritin: 35 ng/mL (ref 15–150)

## 2019-06-12 LAB — HEPATIC FUNCTION PANEL
ALT: 19 IU/L (ref 0–32)
AST: 13 IU/L (ref 0–40)
Albumin: 3.9 g/dL (ref 3.8–4.8)
Alkaline Phosphatase: 72 IU/L (ref 39–117)
Bilirubin Total: 0.4 mg/dL (ref 0.0–1.2)
Bilirubin, Direct: 0.12 mg/dL (ref 0.00–0.40)
Total Protein: 7.3 g/dL (ref 6.0–8.5)

## 2019-06-12 LAB — HEMOGLOBIN A1C
Est. average glucose Bld gHb Est-mCnc: 117 mg/dL
Hgb A1c MFr Bld: 5.7 % — ABNORMAL HIGH (ref 4.8–5.6)

## 2019-06-12 LAB — LIPID PANEL
Chol/HDL Ratio: 4.8 ratio — ABNORMAL HIGH (ref 0.0–4.4)
Cholesterol, Total: 167 mg/dL (ref 100–199)
HDL: 35 mg/dL — ABNORMAL LOW (ref >39)
LDL Chol Calc (NIH): 118 mg/dL — ABNORMAL HIGH (ref 0–99)
Triglycerides: 71 mg/dL (ref 0–149)
VLDL Cholesterol Cal: 14 mg/dL (ref 5–40)

## 2019-06-12 LAB — BASIC METABOLIC PANEL
BUN/Creatinine Ratio: 23 (ref 9–23)
BUN: 15 mg/dL (ref 6–24)
CO2: 19 mmol/L — ABNORMAL LOW (ref 20–29)
Calcium: 9.3 mg/dL (ref 8.7–10.2)
Chloride: 104 mmol/L (ref 96–106)
Creatinine, Ser: 0.66 mg/dL (ref 0.57–1.00)
GFR calc Af Amer: 125 mL/min/{1.73_m2} (ref >59)
GFR calc non Af Amer: 109 mL/min/{1.73_m2} (ref >59)
Glucose: 96 mg/dL (ref 65–99)
Potassium: 4.3 mmol/L (ref 3.5–5.2)
Sodium: 139 mmol/L (ref 134–144)

## 2019-06-12 LAB — INSULIN, RANDOM: INSULIN: 27.1 u[IU]/mL — ABNORMAL HIGH (ref 2.6–24.9)

## 2019-06-12 LAB — TSH: TSH: 2.36 u[IU]/mL (ref 0.450–4.500)

## 2019-06-12 LAB — VITAMIN D 25 HYDROXY (VIT D DEFICIENCY, FRACTURES): Vit D, 25-Hydroxy: 51.1 ng/mL (ref 30.0–100.0)

## 2019-06-12 LAB — SEDIMENTATION RATE: Sed Rate: 71 mm/hr — ABNORMAL HIGH (ref 0–32)

## 2019-06-15 ENCOUNTER — Other Ambulatory Visit (INDEPENDENT_AMBULATORY_CARE_PROVIDER_SITE_OTHER): Payer: Self-pay | Admitting: Family Medicine

## 2019-06-15 ENCOUNTER — Other Ambulatory Visit: Payer: Self-pay | Admitting: Nurse Practitioner

## 2019-06-15 DIAGNOSIS — E559 Vitamin D deficiency, unspecified: Secondary | ICD-10-CM

## 2019-06-15 MED FILL — VIT D2 1.25 MG (50,000 UNIT: 1.25 MG | 28 days supply | Qty: 4 | Fill #0

## 2019-06-15 MED FILL — VICTOZA 18 MG/3 ML INJECT P: 18 | 90 days supply | Qty: 27 | Fill #0

## 2019-06-15 MED FILL — HYDROCHLOROTHIAZIDE 25 MG T: 25 | 90 days supply | Qty: 90 | Fill #0

## 2019-06-15 MED FILL — metFORMIN HCL ER 500 MG TB2: 500 | 30 days supply | Qty: 60 | Fill #3

## 2019-06-23 ENCOUNTER — Telehealth (INDEPENDENT_AMBULATORY_CARE_PROVIDER_SITE_OTHER): Payer: 59 | Admitting: Family Medicine

## 2019-06-23 ENCOUNTER — Other Ambulatory Visit: Payer: Self-pay

## 2019-06-23 DIAGNOSIS — R7303 Prediabetes: Secondary | ICD-10-CM | POA: Diagnosis not present

## 2019-06-23 DIAGNOSIS — Z6841 Body Mass Index (BMI) 40.0 and over, adult: Secondary | ICD-10-CM

## 2019-06-23 NOTE — Progress Notes (Signed)
TeleHealth Visit:  Due to the COVID-19 pandemic, this visit was completed with telemedicine (audio/video) technology to reduce patient and provider exposure as well as to preserve personal protective equipment.   Christina Rodriguez has verbally consented to this TeleHealth visit. The patient is located at home, the provider is located at the [facility] office. The participants in this visit include the listed provider and patient. The visit was conducted today via face time.   Chief Complaint: OBESITY Christina Rodriguez is here to discuss her progress with her obesity treatment plan along with follow-up of her obesity related diagnoses. Christina Rodriguez is on keeping a food journal and adhering to recommended goals of 1500 calories and 90 grams of protein daily and states she is following her eating plan approximately 90% of the time. Christina Rodriguez states she is walking for 30 minutes 1-2 times per week.  Today's visit was #: 22 Starting weight: 260 lbs Starting date: 12/26/16  Interim History: Christina Rodriguez feels she has done well maintaining her weight. She is the same weight, but she feels bloated so she may be retaining fluid. She has done better with journaling and meeting her protein goals. She states her weight was 254 lbs on 06/22/2019.  Subjective:   1. Pre-diabetes Christina Rodriguez's levels are worsening. She is on Victoza at 1.8 mg but her A1c has increased to 5.7, although this has included Christmas eating. She has done better with her eating plan and decreasing simple carbohydrates since her last visit. I discussed labs with the patient today.  Assessment/Plan:   1. Pre-diabetes Christina Rodriguez will continue Victoza, and will continue to work on weight loss, diet, exercise, and decreasing simple carbohydrates to help decrease the risk of diabetes. We will continue to monitor closely.  2. Class 3 severe obesity with serious comorbidity and body mass index (BMI) of 40.0 to 44.9 in adult, unspecified obesity type  [facility]) Christina Rodriguez is currently in the action stage of change. As such, her goal is to continue with weight loss efforts. She has agreed to keeping a food journal and adhering to recommended goals of 1400-1500 calories and 90+ grams of protein daily.  Exercise goals: Christina Rodriguez is continue her current exercise regimen as is.  Behavioral modification strategies: keeping a strict food journal.  Christina Rodriguez has agreed to follow-up with our clinic in 3 weeks. She was informed of the importance of frequent follow-up visits to maximize her success with intensive lifestyle modifications for her multiple health conditions.  Objective:   VITALS: Per patient if applicable, see vitals. GENERAL: Alert and in no acute distress. CARDIOPULMONARY: No increased WOB. Speaking in clear sentences.  PSYCH: Pleasant and cooperative. Speech normal rate and rhythm. Affect is appropriate. Insight and judgement are appropriate. Attention is focused, linear, and appropriate.  NEURO: Oriented as arrived to appointment on time with no prompting.   Lab Results  Component Value Date   CREATININE 0.66 06/11/2019   BUN 15 06/11/2019   NA 139 06/11/2019   K 4.3 06/11/2019   CL 104 06/11/2019   CO2 19 (L) 06/11/2019   Lab Results  Component Value Date   ALT 19 06/11/2019   AST 13 06/11/2019   ALKPHOS 72 06/11/2019   BILITOT 0.4 06/11/2019   Lab Results  Component Value Date   HGBA1C 5.7 (H) 06/11/2019   HGBA1C 5.5 11/17/2018   HGBA1C 5.4 07/14/2018   HGBA1C 5.2 02/18/2018   HGBA1C 5.4 09/10/2017   Lab Results  Component Value Date   INSULIN 27.1 (H) 06/11/2019  INSULIN 24.5 11/17/2018   INSULIN 34.1 (H) 07/14/2018   INSULIN 16.7 02/18/2018   INSULIN 24.7 09/10/2017   Lab Results  Component Value Date   TSH 2.360 06/11/2019   Lab Results  Component Value Date   CHOL 167 06/11/2019   HDL 35 (L) 06/11/2019   LDLCALC 118 (H) 06/11/2019   TRIG 71 06/11/2019   CHOLHDL 4.8 (H) 06/11/2019   Lab Results   Component Value Date   WBC 7.4 11/17/2018   HGB 13.1 11/17/2018   HCT 40.6 11/17/2018   MCV 88 11/17/2018   PLT 340 11/17/2018   Lab Results  Component Value Date   FERRITIN 35 06/11/2019    Attestation Statements:   Reviewed by clinician on day of visit: allergies, medications, problem list, medical history, surgical history, family history, social history, and previous encounter notes.  Time spent on visit including pre-visit chart review and post-visit care was 21 minutes.    I, Burt Knack, am acting as transcriptionist for Quillian Quince, MD.  I have reviewed the above documentation for accuracy and completeness, and I agree with the above. - Quillian Quince, MD

## 2019-07-01 ENCOUNTER — Ambulatory Visit
Admission: RE | Admit: 2019-07-01 | Discharge: 2019-07-01 | Disposition: A | Discharge status: Home / Self Care | Payer: 59 | Source: Ambulatory Visit | Attending: Obstetrics and Gynecology | Admitting: Obstetrics and Gynecology

## 2019-07-01 DIAGNOSIS — Z9189 Other specified personal risk factors, not elsewhere classified: Secondary | ICD-10-CM

## 2019-07-01 DIAGNOSIS — N6489 Other specified disorders of breast: Secondary | ICD-10-CM | POA: Diagnosis not present

## 2019-07-01 MED ORDER — GADOBUTROL 1 MMOL/ML IV SOLN
10.0000 mL | Freq: Once | INTRAVENOUS | Status: AC | PRN
  Administered 2019-07-01: 10 mL via INTRAVENOUS

## 2019-07-05 ENCOUNTER — Other Ambulatory Visit: Payer: Self-pay | Admitting: Obstetrics and Gynecology

## 2019-07-06 ENCOUNTER — Other Ambulatory Visit: Payer: Self-pay | Admitting: Obstetrics and Gynecology

## 2019-07-06 DIAGNOSIS — N6320 Unspecified lump in the left breast, unspecified quadrant: Secondary | ICD-10-CM

## 2019-07-06 DIAGNOSIS — R9389 Abnormal findings on diagnostic imaging of other specified body structures: Secondary | ICD-10-CM

## 2019-07-09 ENCOUNTER — Other Ambulatory Visit: Payer: Self-pay | Admitting: Obstetrics and Gynecology

## 2019-07-09 ENCOUNTER — Ambulatory Visit
Admission: RE | Admit: 2019-07-09 | Discharge: 2019-07-09 | Disposition: A | Discharge status: Home / Self Care | Payer: 59 | Source: Ambulatory Visit | Attending: Obstetrics and Gynecology | Admitting: Obstetrics and Gynecology

## 2019-07-09 ENCOUNTER — Other Ambulatory Visit: Payer: Self-pay

## 2019-07-09 DIAGNOSIS — R599 Enlarged lymph nodes, unspecified: Secondary | ICD-10-CM

## 2019-07-09 DIAGNOSIS — N6489 Other specified disorders of breast: Secondary | ICD-10-CM | POA: Diagnosis not present

## 2019-07-09 DIAGNOSIS — R9389 Abnormal findings on diagnostic imaging of other specified body structures: Secondary | ICD-10-CM

## 2019-07-09 DIAGNOSIS — N6320 Unspecified lump in the left breast, unspecified quadrant: Secondary | ICD-10-CM

## 2019-07-14 ENCOUNTER — Other Ambulatory Visit: Payer: Self-pay

## 2019-07-14 ENCOUNTER — Telehealth (INDEPENDENT_AMBULATORY_CARE_PROVIDER_SITE_OTHER): Payer: 59 | Admitting: Family Medicine

## 2019-07-14 DIAGNOSIS — Z6841 Body Mass Index (BMI) 40.0 and over, adult: Secondary | ICD-10-CM | POA: Diagnosis not present

## 2019-07-14 DIAGNOSIS — R7303 Prediabetes: Secondary | ICD-10-CM

## 2019-07-14 NOTE — Progress Notes (Signed)
TeleHealth Visit:  Due to the COVID-19 pandemic, this visit was completed with telemedicine (audio/video) technology to reduce patient and provider exposure as well as to preserve personal protective equipment.   Dajai has verbally consented to this TeleHealth visit. The patient is located at home, the provider is located at the [facility] office. The participants in this visit include the listed provider and patient. The visit was conducted today via face time.   Chief Complaint: OBESITY Sianne is here to discuss her progress with her obesity treatment plan along with follow-up of her obesity related diagnoses. Darline is on keeping a food journal and adhering to recommended goals of 1400-1500 calories and 90+ grams of protein daily and states she is following her eating plan approximately 50% of the time. Keylee states she is walking for 30-45 minutes 3-4 times per week.  Today's visit was #: 16 Starting weight: 260 lbs Starting date: 12/26/2016  Interim History: Lateesha continues to do well with maintaining her weight. Her daughter has started softball, and she notes there will be less meal planning on practice nights and more "grab and go" eating at times. She is planning on increasing outdoor activity with the nicer weather. She notes her weight today was 254 lbs.  Subjective:   1. Pre-diabetes Louvinia is stable on Victoza and she denies nausea or vomiting. She is still working on [employer], [employer] and increasing lean protein. She plans on increasing exercise.  Assessment/Plan:   1. Pre-diabetes Mariama will continue to work on weight loss, exercise, and decreasing simple carbohydrates to help decrease the risk of diabetes.   2. Class 3 severe obesity with serious comorbidity and body mass index (BMI) of 40.0 to 44.9 in adult, unspecified obesity type [facility]) Lauralei is currently in the action stage of change. As such, her goal is to continue with weight loss efforts. She has  agreed to keeping a food journal and adhering to recommended goals of 1400 calories and 90+ grams of protein daily.   Exercise goals: As is.  Behavioral modification strategies: meal planning and cooking strategies.  Earlean has agreed to follow-up with our clinic in 3 weeks. She was informed of the importance of frequent follow-up visits to maximize her success with intensive lifestyle modifications for her multiple health conditions.  Objective:   VITALS: Per patient if applicable, see vitals. GENERAL: Alert and in no acute distress. CARDIOPULMONARY: No increased WOB. Speaking in clear sentences.  PSYCH: Pleasant and cooperative. Speech normal rate and rhythm. Affect is appropriate. Insight and judgement are appropriate. Attention is focused, linear, and appropriate.  NEURO: Oriented as arrived to appointment on time with no prompting.   Lab Results  Component Value Date   CREATININE 0.66 06/11/2019   BUN 15 06/11/2019   NA 139 06/11/2019   K 4.3 06/11/2019   CL 104 06/11/2019   CO2 19 (L) 06/11/2019   Lab Results  Component Value Date   ALT 19 06/11/2019   AST 13 06/11/2019   ALKPHOS 72 06/11/2019   BILITOT 0.4 06/11/2019   Lab Results  Component Value Date   HGBA1C 5.7 (H) 06/11/2019   HGBA1C 5.5 11/17/2018   HGBA1C 5.4 07/14/2018   HGBA1C 5.2 02/18/2018   HGBA1C 5.4 09/10/2017   Lab Results  Component Value Date   INSULIN 27.1 (H) 06/11/2019   INSULIN 24.5 11/17/2018   INSULIN 34.1 (H) 07/14/2018   INSULIN 16.7 02/18/2018   INSULIN 24.7 09/10/2017   Lab Results  Component Value Date  TSH 2.360 06/11/2019   Lab Results  Component Value Date   CHOL 167 06/11/2019   HDL 35 (L) 06/11/2019   LDLCALC 118 (H) 06/11/2019   TRIG 71 06/11/2019   CHOLHDL 4.8 (H) 06/11/2019   Lab Results  Component Value Date   WBC 7.4 11/17/2018   HGB 13.1 11/17/2018   HCT 40.6 11/17/2018   MCV 88 11/17/2018   PLT 340 11/17/2018   Lab Results  Component Value Date    FERRITIN 35 06/11/2019    Attestation Statements:   Reviewed by clinician on day of visit: allergies, medications, problem list, medical history, surgical history, family history, social history, and previous encounter notes.  Time spent on visit including pre-visit chart review and post-visit charting and care was 20 minutes.    I, Burt Knack, am acting as transcriptionist for Quillian Quince, MD.  I have reviewed the above documentation for accuracy and completeness, and I agree with the above. - Quillian Quince, MD

## 2019-08-04 ENCOUNTER — Encounter (INDEPENDENT_AMBULATORY_CARE_PROVIDER_SITE_OTHER): Payer: Self-pay | Admitting: Family Medicine

## 2019-08-04 ENCOUNTER — Telehealth (INDEPENDENT_AMBULATORY_CARE_PROVIDER_SITE_OTHER): Payer: 59 | Admitting: Family Medicine

## 2019-08-04 ENCOUNTER — Other Ambulatory Visit: Payer: Self-pay

## 2019-08-04 DIAGNOSIS — Z6841 Body Mass Index (BMI) 40.0 and over, adult: Secondary | ICD-10-CM | POA: Diagnosis not present

## 2019-08-04 DIAGNOSIS — R7303 Prediabetes: Secondary | ICD-10-CM

## 2019-08-04 NOTE — Progress Notes (Signed)
TeleHealth Visit:  Due to the COVID-19 pandemic, this visit was completed with telemedicine (audio/video) technology to reduce patient and provider exposure as well as to preserve personal protective equipment.   Christina Rodriguez has verbally consented to this TeleHealth visit. The patient is located at home, the provider is located at the [facility] office. The participants in this visit include the listed provider and patient. [name] was unable to use realtime audiovisual technology today and the telehealth visit was conducted via telephone.   Chief Complaint: OBESITY Christina Rodriguez is here to discuss her progress with her obesity treatment plan along with follow-up of her obesity related diagnoses. Christina Rodriguez is on keeping a food journal and adhering to recommended goals of 1400 calories and 90+ grams of protein daily and states she is following her eating plan approximately 90% of the time. Christina Rodriguez states she is walking for 30-45 minutes 3-4 times per week.  Today's visit was #: 24 Starting weight: 260 lbs Starting date: 12/26/2016  Interim History: Christina Rodriguez feels she has done well maintaining her weight. Her kids are back in sports now and they are eating out more which is preventing weight loss.  Subjective:   1. Pre-diabetes Christina Rodriguez is doing well on Victoza, and she notes decreased polyphagia. She denies hypoglycemia, nausea, or vomiting.  Assessment/Plan:   1. Pre-diabetes Christina Rodriguez will continue to work on weight loss, diet, exercise, and decreasing simple carbohydrates to help decrease the risk of diabetes. Christina Rodriguez will continue metformin, and will continue to monitor.  2. Class 3 severe obesity with serious comorbidity and body mass index (BMI) of 40.0 to 44.9 in adult, unspecified obesity type Christina Rodriguez) Christina Rodriguez is currently in the action stage of change. As such, her goal is to continue with weight loss efforts. She has agreed to keeping a food journal and adhering to recommended goals of  1500 calories and 90 grams of protein.   Christina Rodriguez was given better ideas for fast food options for this Spring.  Exercise goals: As is.  Behavioral modification strategies: increasing lean protein intake and keeping a strict food journal.  Christina Rodriguez has agreed to follow-up with our clinic in 3 to 4 weeks with Dr. Elwyn Lade. She was informed of the importance of frequent follow-up visits to maximize her success with intensive lifestyle modifications for her multiple health conditions.  Objective:   VITALS: Per patient if applicable, see vitals. GENERAL: Alert and in no acute distress. CARDIOPULMONARY: No increased WOB. Speaking in clear sentences.  PSYCH: Pleasant and cooperative. Speech normal rate and rhythm. Affect is appropriate. Insight and judgement are appropriate. Attention is focused, linear, and appropriate.  NEURO: Oriented as arrived to appointment on time with no prompting.   Lab Results  Component Value Date   CREATININE 0.66 06/11/2019   BUN 15 06/11/2019   NA 139 06/11/2019   K 4.3 06/11/2019   CL 104 06/11/2019   CO2 19 (L) 06/11/2019   Lab Results  Component Value Date   ALT 19 06/11/2019   AST 13 06/11/2019   ALKPHOS 72 06/11/2019   BILITOT 0.4 06/11/2019   Lab Results  Component Value Date   HGBA1C 5.7 (H) 06/11/2019   HGBA1C 5.5 11/17/2018   HGBA1C 5.4 07/14/2018   HGBA1C 5.2 02/18/2018   HGBA1C 5.4 09/10/2017   Lab Results  Component Value Date   INSULIN 27.1 (H) 06/11/2019   INSULIN 24.5 11/17/2018   INSULIN 34.1 (H) 07/14/2018   INSULIN 16.7 02/18/2018   INSULIN 24.7 09/10/2017   Lab  Results  Component Value Date   TSH 2.360 06/11/2019   Lab Results  Component Value Date   CHOL 167 06/11/2019   HDL 35 (L) 06/11/2019   LDLCALC 118 (H) 06/11/2019   TRIG 71 06/11/2019   CHOLHDL 4.8 (H) 06/11/2019   Lab Results  Component Value Date   WBC 7.4 11/17/2018   HGB 13.1 11/17/2018   HCT 40.6 11/17/2018   MCV 88 11/17/2018   PLT 340 11/17/2018    Lab Results  Component Value Date   FERRITIN 35 06/11/2019    Attestation Statements:   Reviewed by clinician on day of visit: allergies, medications, problem list, medical history, surgical history, family history, social history, and previous encounter notes.  Time spent on visit including pre-visit chart review and post-visit charting and care was 21 minutes.    I, Burt Knack, am acting as transcriptionist for Quillian Quince, MD.  I have reviewed the above documentation for accuracy and completeness, and I agree with the above. - Quillian Quince, MD

## 2019-08-25 ENCOUNTER — Other Ambulatory Visit: Payer: Self-pay

## 2019-08-25 ENCOUNTER — Other Ambulatory Visit (INDEPENDENT_AMBULATORY_CARE_PROVIDER_SITE_OTHER): Payer: 59

## 2019-08-25 DIAGNOSIS — Z23 Encounter for immunization: Secondary | ICD-10-CM | POA: Diagnosis not present

## 2019-08-26 ENCOUNTER — Ambulatory Visit (INDEPENDENT_AMBULATORY_CARE_PROVIDER_SITE_OTHER): Payer: 59 | Admitting: Family Medicine

## 2019-08-26 ENCOUNTER — Encounter (INDEPENDENT_AMBULATORY_CARE_PROVIDER_SITE_OTHER): Payer: Self-pay | Admitting: Family Medicine

## 2019-08-26 VITALS — BP 105/73 | HR 82 | Temp 98.1°F | Ht 67.0 in | Wt 255.0 lb

## 2019-08-26 DIAGNOSIS — E559 Vitamin D deficiency, unspecified: Secondary | ICD-10-CM | POA: Diagnosis not present

## 2019-08-26 DIAGNOSIS — E7849 Other hyperlipidemia: Secondary | ICD-10-CM

## 2019-08-26 DIAGNOSIS — R7303 Prediabetes: Secondary | ICD-10-CM | POA: Diagnosis not present

## 2019-08-26 DIAGNOSIS — Z9189 Other specified personal risk factors, not elsewhere classified: Secondary | ICD-10-CM | POA: Diagnosis not present

## 2019-08-26 DIAGNOSIS — Z6841 Body Mass Index (BMI) 40.0 and over, adult: Secondary | ICD-10-CM | POA: Diagnosis not present

## 2019-08-27 ENCOUNTER — Ambulatory Visit
Admission: RE | Admit: 2019-08-27 | Discharge: 2019-08-27 | Disposition: A | Discharge status: Home / Self Care | Payer: 59 | Source: Ambulatory Visit | Attending: Obstetrics and Gynecology | Admitting: Obstetrics and Gynecology

## 2019-08-27 ENCOUNTER — Other Ambulatory Visit: Payer: Self-pay

## 2019-08-27 DIAGNOSIS — N6320 Unspecified lump in the left breast, unspecified quadrant: Secondary | ICD-10-CM

## 2019-08-27 DIAGNOSIS — R599 Enlarged lymph nodes, unspecified: Secondary | ICD-10-CM

## 2019-08-27 DIAGNOSIS — R590 Localized enlarged lymph nodes: Secondary | ICD-10-CM | POA: Diagnosis not present

## 2019-08-27 LAB — COMPREHENSIVE METABOLIC PANEL
ALT: 20 IU/L (ref 0–32)
AST: 15 IU/L (ref 0–40)
Albumin/Globulin Ratio: 1.4 (ref 1.2–2.2)
Albumin: 4 g/dL (ref 3.8–4.8)
Alkaline Phosphatase: 73 IU/L (ref 39–117)
BUN/Creatinine Ratio: 23 (ref 9–23)
BUN: 14 mg/dL (ref 6–24)
Bilirubin Total: 0.3 mg/dL (ref 0.0–1.2)
CO2: 24 mmol/L (ref 20–29)
Calcium: 9.4 mg/dL (ref 8.7–10.2)
Chloride: 105 mmol/L (ref 96–106)
Creatinine, Ser: 0.61 mg/dL (ref 0.57–1.00)
GFR calc Af Amer: 128 mL/min/{1.73_m2} (ref >59)
GFR calc non Af Amer: 111 mL/min/{1.73_m2} (ref >59)
Globulin, Total: 2.8 g/dL (ref 1.5–4.5)
Glucose: 82 mg/dL (ref 65–99)
Potassium: 4 mmol/L (ref 3.5–5.2)
Sodium: 142 mmol/L (ref 134–144)
Total Protein: 6.8 g/dL (ref 6.0–8.5)

## 2019-08-27 LAB — LIPID PANEL WITH LDL/HDL RATIO
Cholesterol, Total: 139 mg/dL (ref 100–199)
HDL: 32 mg/dL — ABNORMAL LOW (ref >39)
LDL Chol Calc (NIH): 92 mg/dL (ref 0–99)
LDL/HDL Ratio: 2.9 ratio (ref 0.0–3.2)
Triglycerides: 73 mg/dL (ref 0–149)
VLDL Cholesterol Cal: 15 mg/dL (ref 5–40)

## 2019-08-27 LAB — HEMOGLOBIN A1C
Est. average glucose Bld gHb Est-mCnc: 114 mg/dL
Hgb A1c MFr Bld: 5.6 % (ref 4.8–5.6)

## 2019-08-27 LAB — VITAMIN D 25 HYDROXY (VIT D DEFICIENCY, FRACTURES): Vit D, 25-Hydroxy: 33.8 ng/mL (ref 30.0–100.0)

## 2019-08-27 LAB — INSULIN, RANDOM: INSULIN: 30 u[IU]/mL — ABNORMAL HIGH (ref 2.6–24.9)

## 2019-08-30 NOTE — Progress Notes (Signed)
Chief Complaint:   OBESITY Christina Rodriguez is here to discuss her progress with her obesity treatment plan along with follow-up of her obesity related diagnoses. Christina Rodriguez is on keeping a food journal and adhering to recommended goals of 1500 calories and 90 grams of protein and states she is following her eating plan approximately 90% of the time. Christina Rodriguez states she is walking for 30 minutes 3 times per week and going to the [facility] for 30 minutes 2 times per week.  Today's visit was #: 44 Starting weight: 260 lbs Starting date: 12/26/2016 Today's weight: 255 lbs Today's date: 08/26/2019 Total lbs lost to date: 5 lbs Total lbs lost since last in-office visit: 1 lb  Interim History: Christina Rodriguez returns to the clinic today for the first time since 02/18/2019.  She has been sticking with journaling.  She has started walking and joined the [employer].  Average calorie intake is around 1400 and protein is getting close to 90 grams (having to supplement with a shake).  Subjective:   1. Other hyperlipidemia Christina Rodriguez has hyperlipidemia and has been trying to improve her cholesterol levels with intensive lifestyle modification including a low saturated fat diet, exercise and weight loss. She denies any chest pain, claudication or myalgias.  She is not on a statin.  Lab Results  Component Value Date   ALT 20 08/26/2019   AST 15 08/26/2019   ALKPHOS 73 08/26/2019   BILITOT 0.3 08/26/2019   Lab Results  Component Value Date   CHOL 139 08/26/2019   HDL 32 (L) 08/26/2019   LDLCALC 92 08/26/2019   TRIG 73 08/26/2019   CHOLHDL 4.8 (H) 06/11/2019   2. Prediabetes Christina Rodriguez has a diagnosis of prediabetes based on her elevated HgA1c and was informed this puts her at greater risk of developing diabetes. She continues to work on diet and exercise to decrease her risk of diabetes. She denies nausea or hypoglycemia.  She is taking metformin 1000 mg in the morning.   Lab Results  Component Value Date   HGBA1C 5.6 08/26/2019     Lab Results  Component Value Date   INSULIN 30.0 (H) 08/26/2019   INSULIN 27.1 (H) 06/11/2019   INSULIN 24.5 11/17/2018   INSULIN 34.1 (H) 07/14/2018   INSULIN 16.7 02/18/2018   3. Vitamin D deficiency Christina Rodriguez's Vitamin D level was 33.8 on 08/26/2019. She is currently taking prescription vitamin D 50,000 IU each week. She denies nausea, vomiting or muscle weakness.  She indorses fatigue.  4. At risk for heart disease Christina Rodriguez is at a higher than average risk for cardiovascular disease due to obesity.   Assessment/Plan:   1. Other hyperlipidemia Cardiovascular risk and specific lipid/LDL goals reviewed.  We discussed several lifestyle modifications today and Christina Rodriguez will continue to work on diet, exercise and weight loss efforts. Orders and follow up as documented in patient record.   Counseling Intensive lifestyle modifications are the first line treatment for this issue. . Dietary changes: Increase soluble fiber. Decrease simple carbohydrates. . Exercise changes: Moderate to vigorous-intensity aerobic activity 150 minutes per week if tolerated. . Lipid-lowering medications: see documented in medical record. - Lipid Panel With LDL/HDL Ratio  2. Prediabetes Christina Rodriguez will continue to work on weight loss, exercise, and decreasing simple carbohydrates to help decrease the risk of diabetes.  - Comprehensive metabolic panel - Hemoglobin A1c - Insulin, random  3. Vitamin D deficiency Low Vitamin D level contributes to fatigue and are associated with obesity, breast, and colon cancer. She agrees to  continue to take prescription Vitamin D @50 ,000 IU every week and will follow-up for routine testing of Vitamin D, at least 2-3 times per year to avoid over-replacement. - VITAMIN D 25 Hydroxy (Vit-D Deficiency, Fractures)  4. At risk for heart disease Christina Rodriguez was given approximately 15 minutes of coronary artery disease prevention counseling today. She is 44 y.o. female and has risk factors for  heart disease including obesity. We discussed intensive lifestyle modifications today with an emphasis on specific weight loss instructions and strategies.   Repetitive spaced learning was employed today to elicit superior memory formation and behavioral change.  5. Class 3 severe obesity with serious comorbidity and body mass index (BMI) of 40.0 to 44.9 in adult, unspecified obesity type Christina Rodriguez) Christina Rodriguez is currently in the action stage of change. As such, her goal is to continue with weight loss efforts. She has agreed to keeping a food journal and adhering to recommended goals of 1500 calories and 90+ protein.   Exercise goals: As is.  Behavioral modification strategies: increasing lean protein intake, meal planning and cooking strategies, planning for success and keeping a strict food journal.  Christina Rodriguez has agreed to follow-up with our clinic in 4 weeks. She was informed of the importance of frequent follow-up visits to maximize her success with intensive lifestyle modifications for her multiple health conditions.   Christina Rodriguez was informed we would discuss her lab results at her next visit unless there is a critical issue that needs to be addressed sooner. Christina Rodriguez agreed to keep her next visit at the agreed upon time to discuss these results.  Objective:   Blood pressure 105/73, pulse 82, temperature 98.1 F (36.7 C), temperature source Oral, height 5\' 7"  (1.702 m), weight 255 lb (115.7 kg), last menstrual period 08/26/2019, SpO2 97 %. Body mass index is 39.94 kg/m.  General: Cooperative, alert, well developed, in no acute distress. HEENT: Conjunctivae and lids unremarkable. Cardiovascular: Regular rhythm.  Lungs: Normal work of breathing. Neurologic: No focal deficits.   Lab Results  Component Value Date   CREATININE 0.61 08/26/2019   BUN 14 08/26/2019   NA 142 08/26/2019   K 4.0 08/26/2019   CL 105 08/26/2019   CO2 24 08/26/2019   Lab Results  Component Value Date   ALT 20  08/26/2019   AST 15 08/26/2019   ALKPHOS 73 08/26/2019   BILITOT 0.3 08/26/2019   Lab Results  Component Value Date   HGBA1C 5.6 08/26/2019   HGBA1C 5.7 (H) 06/11/2019   HGBA1C 5.5 11/17/2018   HGBA1C 5.4 07/14/2018   HGBA1C 5.2 02/18/2018   Lab Results  Component Value Date   INSULIN 30.0 (H) 08/26/2019   INSULIN 27.1 (H) 06/11/2019   INSULIN 24.5 11/17/2018   INSULIN 34.1 (H) 07/14/2018   INSULIN 16.7 02/18/2018   Lab Results  Component Value Date   TSH 2.360 06/11/2019   Lab Results  Component Value Date   CHOL 139 08/26/2019   HDL 32 (L) 08/26/2019   LDLCALC 92 08/26/2019   TRIG 73 08/26/2019   CHOLHDL 4.8 (H) 06/11/2019   Lab Results  Component Value Date   WBC 7.4 11/17/2018   HGB 13.1 11/17/2018   HCT 40.6 11/17/2018   MCV 88 11/17/2018   PLT 340 11/17/2018   Lab Results  Component Value Date   FERRITIN 35 06/11/2019   Attestation Statements:   Reviewed by clinician on day of visit: allergies, medications, problem list, medical history, surgical history, family history, social history, and previous encounter notes.  I, Insurance claims handler, CMA, am acting as transcriptionist for Reuben Likes, MD.  I have reviewed the above documentation for accuracy and completeness, and I agree with the above. - Debbra Riding, MD

## 2019-09-08 ENCOUNTER — Other Ambulatory Visit: Payer: Self-pay | Admitting: Nurse Practitioner

## 2019-09-08 MED ORDER — FLUCONAZOLE 150 MG PO TABS: ORAL_TABLET | ORAL | 0 refills | Status: DC

## 2019-09-08 MED ORDER — AMOXICILLIN 500 MG PO CAPS: 500.0000 mg | ORAL_CAPSULE | Freq: Three times a day (TID) | ORAL | 0 refills | Status: DC

## 2019-09-16 ENCOUNTER — Ambulatory Visit (INDEPENDENT_AMBULATORY_CARE_PROVIDER_SITE_OTHER): Payer: 59 | Admitting: Family Medicine

## 2019-09-17 ENCOUNTER — Other Ambulatory Visit: Payer: Self-pay | Admitting: Nurse Practitioner

## 2019-09-17 ENCOUNTER — Other Ambulatory Visit (INDEPENDENT_AMBULATORY_CARE_PROVIDER_SITE_OTHER): Payer: Self-pay | Admitting: Family Medicine

## 2019-09-17 ENCOUNTER — Telehealth: Payer: Self-pay | Admitting: *Deleted

## 2019-09-17 DIAGNOSIS — R7303 Prediabetes: Secondary | ICD-10-CM

## 2019-09-17 NOTE — Telephone Encounter (Signed)
Also pt stats she is on it for pcos

## 2019-09-17 NOTE — Telephone Encounter (Signed)
Pt states Dr.Beasley office is closed today and states you originally prescribed it and gave her refills in July. Please advise.

## 2019-09-17 NOTE — Telephone Encounter (Signed)
[name], this will have to be reordered by Dr. Dalbert Garnet since she is the authorizing provider. She is using this for prediabetes which is off label. Sorry! [name]

## 2019-09-17 NOTE — Telephone Encounter (Signed)
Needs refill on victozia - 90 day supply [place]

## 2019-09-19 ENCOUNTER — Encounter (INDEPENDENT_AMBULATORY_CARE_PROVIDER_SITE_OTHER): Payer: Self-pay | Admitting: Family Medicine

## 2019-09-20 ENCOUNTER — Other Ambulatory Visit: Payer: Self-pay | Admitting: Nurse Practitioner

## 2019-09-20 DIAGNOSIS — R7303 Prediabetes: Secondary | ICD-10-CM

## 2019-09-20 MED ORDER — LIRAGLUTIDE 18 MG/3ML ~~LOC~~ SOPN: 1.8000 mg | PEN_INJECTOR | Freq: Every day | SUBCUTANEOUS | 0 refills | Status: DC

## 2019-09-20 MED FILL — VICTOZA 18 MG/3 ML INJECT P: 18 | 90 days supply | Qty: 27 | Fill #0

## 2019-09-20 NOTE — Telephone Encounter (Signed)
Let Jennfer know that med was sent to [facility]. Thanks.

## 2019-09-20 NOTE — Telephone Encounter (Signed)
done

## 2019-09-28 ENCOUNTER — Other Ambulatory Visit: Payer: Self-pay | Admitting: Nurse Practitioner

## 2019-09-28 MED ORDER — ONDANSETRON 8 MG PO TBDP: 8.0000 mg | ORAL_TABLET | Freq: Three times a day (TID) | ORAL | 0 refills | Status: AC | PRN

## 2019-09-29 ENCOUNTER — Ambulatory Visit (INDEPENDENT_AMBULATORY_CARE_PROVIDER_SITE_OTHER): Payer: 59 | Admitting: Family Medicine

## 2019-09-29 ENCOUNTER — Other Ambulatory Visit: Payer: Self-pay

## 2019-09-29 ENCOUNTER — Encounter (INDEPENDENT_AMBULATORY_CARE_PROVIDER_SITE_OTHER): Payer: Self-pay | Admitting: Family Medicine

## 2019-09-29 VITALS — BP 106/61 | HR 94 | Temp 98.1°F | Ht 67.0 in | Wt 253.0 lb

## 2019-09-29 DIAGNOSIS — Z9189 Other specified personal risk factors, not elsewhere classified: Secondary | ICD-10-CM | POA: Diagnosis not present

## 2019-09-29 DIAGNOSIS — Z6839 Body mass index (BMI) 39.0-39.9, adult: Secondary | ICD-10-CM | POA: Diagnosis not present

## 2019-09-29 DIAGNOSIS — R7303 Prediabetes: Secondary | ICD-10-CM | POA: Diagnosis not present

## 2019-09-29 DIAGNOSIS — E559 Vitamin D deficiency, unspecified: Secondary | ICD-10-CM

## 2019-09-29 MED ORDER — VITAMIN D (ERGOCALCIFEROL) 1.25 MG (50000 UNIT) PO CAPS: ORAL_CAPSULE | ORAL | 0 refills | Status: DC

## 2019-09-29 MED FILL — VIT D2 1.25 MG (50,000 UNIT: 1.25 MG | 28 days supply | Qty: 4 | Fill #0

## 2019-09-29 NOTE — Progress Notes (Signed)
Chief Complaint:   OBESITY Christina Rodriguez is here to discuss her progress with her obesity treatment plan along with follow-up of her obesity related diagnoses. Christina Rodriguez is on keeping a food journal and adhering to recommended goals of 1500 calories and 90+ grams of protein daily and states she is following her eating plan approximately 85% of the time. Christina Rodriguez states she is walking for 30 minutes 3 times per week.  Today's visit was #: 45 Starting weight: 260 lbs Starting date: 12/26/2016 Today's weight: 253 lbs Today's date: 09/29/2019 Total lbs lost to date: 7 Total lbs lost since last in-office visit: 2  Interim History: Christina Rodriguez notes for the last month they have been traveling frequently for dance recitals, softball tournament, and celebrated her 20th anniversary. She was able to stay on the plan with mindful eating and portion control.  Subjective:   1. Pre-diabetes Christina Rodriguez's A1c on 08/26/2019 was 5.6 and insulin level was 30.0. She is on metformin XR 500 mg 2 tablets daily and liraglutide 1.8 mg once daily, and is tolerating them well. I discussed labs with the patient today.  2. Vitamin D deficiency Christina Rodriguez's Vit D level on 08/26/2019 was 33.8. She is on prescription strength Vit D. I discussed labs with the patient today.  3. At risk for heart disease Christina Rodriguez is at a higher than average risk for cardiovascular disease due to obesity, SVT, and pre-diabetes.   Assessment/Plan:   1. Pre-diabetes Christina Rodriguez will continue to work on weight loss, exercise, and decreasing simple carbohydrates to help decrease the risk of diabetes. Christina Rodriguez will continue her current medications regimen, and we will recheck labs every 3 months.  2. Vitamin D deficiency Low Vitamin D level contributes to fatigue and are associated with obesity, breast, and colon cancer. We will refill prescription Vitamin D for 1 month. Christina Rodriguez will follow-up for routine testing of Vitamin D, at least 2-3 times per year to avoid  over-replacement. We will recheck labs every 3 months.  - Vitamin D, Ergocalciferol, (DRISDOL) 1.25 MG (50000 UNIT) CAPS capsule; TAKE 1 CAPSULE BY MOUTH EVERY 7 DAYS.  Dispense: 4 capsule; Refill: 0  3. At risk for heart disease Christina Rodriguez was given approximately 15 minutes of coronary artery disease prevention counseling today. She is 44 y.o. female and has risk factors for heart disease including obesity, SVT, and pre-diabetes. We discussed intensive lifestyle modifications today with an emphasis on specific weight loss instructions and strategies.   Repetitive spaced learning was employed today to elicit superior memory formation and behavioral change.  4. Class 2 severe obesity with serious comorbidity and body mass index (BMI) of 39.0 to 39.9 in adult, unspecified obesity type Christina Rodriguez) Christina Rodriguez is currently in the action stage of change. As such, her goal is to continue with weight loss efforts. She has agreed to keeping a food journal and adhering to recommended goals of 1500 calories and 90+ grams of protein daily.   Exercise goals: As is.  Behavioral modification strategies: increasing lean protein intake, meal planning and cooking strategies and celebration eating strategies.  Christina Rodriguez has agreed to follow-up with our clinic in 4 weeks. She was informed of the importance of frequent follow-up visits to maximize her success with intensive lifestyle modifications for her multiple health conditions.   Objective:   Blood pressure 106/61, pulse 94, temperature 98.1 F (36.7 C), temperature source Oral, height 5\' 7"  (1.702 m), weight 253 lb (114.8 kg), SpO2 95 %. Body mass index is 39.63 kg/m.  General: Cooperative, alert,  well developed, in no acute distress. HEENT: Conjunctivae and lids unremarkable. Cardiovascular: Regular rhythm.  Lungs: Normal work of breathing. Neurologic: No focal deficits.   Lab Results  Component Value Date   CREATININE 0.61 08/26/2019   BUN 14 08/26/2019   NA  142 08/26/2019   K 4.0 08/26/2019   CL 105 08/26/2019   CO2 24 08/26/2019   Lab Results  Component Value Date   ALT 20 08/26/2019   AST 15 08/26/2019   ALKPHOS 73 08/26/2019   BILITOT 0.3 08/26/2019   Lab Results  Component Value Date   HGBA1C 5.6 08/26/2019   HGBA1C 5.7 (H) 06/11/2019   HGBA1C 5.5 11/17/2018   HGBA1C 5.4 07/14/2018   HGBA1C 5.2 02/18/2018   Lab Results  Component Value Date   INSULIN 30.0 (H) 08/26/2019   INSULIN 27.1 (H) 06/11/2019   INSULIN 24.5 11/17/2018   INSULIN 34.1 (H) 07/14/2018   INSULIN 16.7 02/18/2018   Lab Results  Component Value Date   TSH 2.360 06/11/2019   Lab Results  Component Value Date   CHOL 139 08/26/2019   HDL 32 (L) 08/26/2019   LDLCALC 92 08/26/2019   TRIG 73 08/26/2019   CHOLHDL 4.8 (H) 06/11/2019   Lab Results  Component Value Date   WBC 7.4 11/17/2018   HGB 13.1 11/17/2018   HCT 40.6 11/17/2018   MCV 88 11/17/2018   PLT 340 11/17/2018   Lab Results  Component Value Date   FERRITIN 35 06/11/2019   Attestation Statements:   Reviewed by clinician on day of visit: allergies, medications, problem list, medical history, surgical history, family history, social history, and previous encounter notes.   I, Trixie Dredge, am acting as transcriptionist for Dennard Nip, MD.  I have reviewed the above documentation for accuracy and completeness, and I agree with the above. -  Dennard Nip, MD

## 2019-10-21 ENCOUNTER — Other Ambulatory Visit: Payer: Self-pay

## 2019-10-21 ENCOUNTER — Encounter (INDEPENDENT_AMBULATORY_CARE_PROVIDER_SITE_OTHER): Payer: Self-pay | Admitting: Adult Health

## 2019-10-21 ENCOUNTER — Ambulatory Visit (INDEPENDENT_AMBULATORY_CARE_PROVIDER_SITE_OTHER): Payer: 59 | Admitting: Adult Health

## 2019-10-21 ENCOUNTER — Other Ambulatory Visit: Payer: Self-pay | Admitting: Nurse Practitioner

## 2019-10-21 VITALS — BP 121/74 | HR 69 | Temp 97.7°F | Ht 67.0 in | Wt 258.0 lb

## 2019-10-21 DIAGNOSIS — R7303 Prediabetes: Secondary | ICD-10-CM | POA: Diagnosis not present

## 2019-10-21 DIAGNOSIS — E66813 Obesity, class 3: Secondary | ICD-10-CM

## 2019-10-21 DIAGNOSIS — Z6841 Body Mass Index (BMI) 40.0 and over, adult: Secondary | ICD-10-CM | POA: Diagnosis not present

## 2019-10-21 DIAGNOSIS — E559 Vitamin D deficiency, unspecified: Secondary | ICD-10-CM | POA: Diagnosis not present

## 2019-10-21 MED ORDER — FREESTYLE LIBRE READER DEVI: 0 refills | Status: AC

## 2019-10-21 MED ORDER — VITAMIN D (ERGOCALCIFEROL) 1.25 MG (50000 UNIT) PO CAPS: ORAL_CAPSULE | ORAL | 0 refills | Status: DC

## 2019-10-21 MED ORDER — FREESTYLE LIBRE 14 DAY SENSOR MISC: 11 refills | Status: AC

## 2019-10-21 MED FILL — VIT D2 1.25 MG (50,000 UNIT: 1.25 MG | 28 days supply | Qty: 4 | Fill #0

## 2019-10-22 MED FILL — FREESTYLE LIBRE 2 READER SY: 14 days supply | Qty: 1 | Fill #0

## 2019-10-22 MED FILL — FREESTYLE LIBRE 2 SENSOR SY: 14 days supply | Qty: 1 | Fill #0

## 2019-10-25 NOTE — Progress Notes (Signed)
Chief Complaint:   OBESITY Christina Rodriguez is here to discuss her progress with her obesity treatment plan along with follow-up of her obesity related diagnoses. Christina Rodriguez is on keeping a food journal and adhering to recommended goals of 1500 calories and 90+ grams of protein daily and states she is following her eating plan approximately 75% of the time. Christina Rodriguez states she is walking for 30 minutes 2-3 times per week.  Today's visit was #: 46 Starting weight: 260 lbs Starting date: 12/26/2016 Today's weight: 258 lbs Today's date: 10/21/2019 Total lbs lost to date: 2 Total lbs lost since last in-office visit: 0  Interim History: Christina Rodriguez traveled to [place] [employer] for 8 days and focused on "making better choices during the trip". She also recently started her menses. She notes when she is home she is on track 100% of the time.   Subjective:   1. Pre-diabetes Christina Rodriguez's last A1c was 5.6 on 08/26/2019. She denies polyphagia.  2. Vitamin D deficiency Christina Rodriguez's Vitamin D level was 33.8 on 08/26/2019. She is currently taking prescription vitamin D 50,000 IU each week. She denies nausea, vomiting or muscle weakness.  Assessment/Plan:   1. Pre-diabetes Christina Rodriguez will continue her medication regimen and will continue to work on weight loss, exercise, and decreasing simple carbohydrates to help decrease the risk of diabetes. We will recheck labs at her next office visit.  2. Vitamin D deficiency Low Vitamin D level contributes to fatigue and are associated with obesity, breast, and colon cancer. We will refill prescription Vitamin D for 1 month. Christina Rodriguez will follow-up for routine testing of Vitamin D, at least 2-3 times per year to avoid over-replacement. We will recheck labs at her next office visit.  - Vitamin D, Ergocalciferol, (DRISDOL) 1.25 MG (50000 UNIT) CAPS capsule; TAKE 1 CAPSULE BY MOUTH EVERY 7 DAYS.  Dispense: 4 capsule; Refill: 0  3. Class 3 severe obesity with serious comorbidity and body mass  index (BMI) of 40.0 to 44.9 in adult, unspecified obesity type [facility]) Christina Rodriguez is currently in the action stage of change. As such, her goal is to continue with weight loss efforts. She has agreed to keeping a food journal and adhering to recommended goals of 1500 calories and 90+ grams of protein daily.   Handout provided today: High Protein/Low Calorie Food list.  Exercise goals: As is.  Behavioral modification strategies: no skipping meals, meal planning and cooking strategies, celebration eating strategies and keeping a strict food journal.  Christina Rodriguez has agreed to follow-up with our clinic in 4 weeks. She was informed of the importance of frequent follow-up visits to maximize her success with intensive lifestyle modifications for her multiple health conditions.   Objective:   Blood pressure 121/74, pulse 69, temperature 97.7 F (36.5 C), temperature source Oral, height 5\' 7"  (1.702 m), weight 258 lb (117 kg), SpO2 98 %. Body mass index is 40.41 kg/m.  General: Cooperative, alert, well developed, in no acute distress. HEENT: Conjunctivae and lids unremarkable. Cardiovascular: Regular rhythm.  Lungs: Normal work of breathing. Neurologic: No focal deficits.   Lab Results  Component Value Date   CREATININE 0.61 08/26/2019   BUN 14 08/26/2019   NA 142 08/26/2019   K 4.0 08/26/2019   CL 105 08/26/2019   CO2 24 08/26/2019   Lab Results  Component Value Date   ALT 20 08/26/2019   AST 15 08/26/2019   ALKPHOS 73 08/26/2019   BILITOT 0.3 08/26/2019   Lab Results  Component Value Date   HGBA1C  5.6 08/26/2019   HGBA1C 5.7 (H) 06/11/2019   HGBA1C 5.5 11/17/2018   HGBA1C 5.4 07/14/2018   HGBA1C 5.2 02/18/2018   Lab Results  Component Value Date   INSULIN 30.0 (H) 08/26/2019   INSULIN 27.1 (H) 06/11/2019   INSULIN 24.5 11/17/2018   INSULIN 34.1 (H) 07/14/2018   INSULIN 16.7 02/18/2018   Lab Results  Component Value Date   TSH 2.360 06/11/2019   Lab Results  Component Value  Date   CHOL 139 08/26/2019   HDL 32 (L) 08/26/2019   LDLCALC 92 08/26/2019   TRIG 73 08/26/2019   CHOLHDL 4.8 (H) 06/11/2019   Lab Results  Component Value Date   WBC 7.4 11/17/2018   HGB 13.1 11/17/2018   HCT 40.6 11/17/2018   MCV 88 11/17/2018   PLT 340 11/17/2018   Lab Results  Component Value Date   FERRITIN 35 06/11/2019   Attestation Statements:   Reviewed by clinician on day of visit: allergies, medications, problem list, medical history, surgical history, family history, social history, and previous encounter notes.  Time spent on visit including pre-visit chart review and post-visit care and charting was 27 minutes.    Wilhemena Durie, am acting as Location manager for [employer], NP-C.  I have reviewed the above documentation for accuracy and completeness, and I agree with the above. -  Esaw Grandchild, NP

## 2019-10-26 DIAGNOSIS — Z01419 Encounter for gynecological examination (general) (routine) without abnormal findings: Secondary | ICD-10-CM | POA: Diagnosis not present

## 2019-10-26 DIAGNOSIS — Z1231 Encounter for screening mammogram for malignant neoplasm of breast: Secondary | ICD-10-CM | POA: Diagnosis not present

## 2019-11-16 ENCOUNTER — Other Ambulatory Visit: Payer: Self-pay

## 2019-11-16 ENCOUNTER — Encounter (INDEPENDENT_AMBULATORY_CARE_PROVIDER_SITE_OTHER): Payer: Self-pay | Admitting: Adult Health

## 2019-11-16 ENCOUNTER — Ambulatory Visit (INDEPENDENT_AMBULATORY_CARE_PROVIDER_SITE_OTHER): Payer: 59 | Admitting: Adult Health

## 2019-11-16 VITALS — BP 107/71 | HR 76 | Temp 98.5°F | Ht 67.0 in | Wt 257.0 lb

## 2019-11-16 DIAGNOSIS — R7989 Other specified abnormal findings of blood chemistry: Secondary | ICD-10-CM | POA: Diagnosis not present

## 2019-11-16 DIAGNOSIS — R7303 Prediabetes: Secondary | ICD-10-CM | POA: Diagnosis not present

## 2019-11-16 DIAGNOSIS — E66813 Obesity, class 3: Secondary | ICD-10-CM

## 2019-11-16 DIAGNOSIS — Z9189 Other specified personal risk factors, not elsewhere classified: Secondary | ICD-10-CM | POA: Diagnosis not present

## 2019-11-16 DIAGNOSIS — Z6841 Body Mass Index (BMI) 40.0 and over, adult: Secondary | ICD-10-CM

## 2019-11-16 DIAGNOSIS — E559 Vitamin D deficiency, unspecified: Secondary | ICD-10-CM | POA: Diagnosis not present

## 2019-11-16 MED ORDER — METFORMIN HCL ER 500 MG PO TB24: 1000.0000 mg | ORAL_TABLET | Freq: Every day | ORAL | 1 refills | Status: DC

## 2019-11-16 MED ORDER — LIRAGLUTIDE 18 MG/3ML ~~LOC~~ SOPN: 1.8000 mg | PEN_INJECTOR | Freq: Every day | SUBCUTANEOUS | 0 refills | Status: DC

## 2019-11-16 MED FILL — metFORMIN HCL ER 500 MG TB2: 500 | 90 days supply | Qty: 180 | Fill #0

## 2019-11-17 LAB — COMPREHENSIVE METABOLIC PANEL
ALT: 21 IU/L (ref 0–32)
AST: 18 IU/L (ref 0–40)
Albumin/Globulin Ratio: 1.3 (ref 1.2–2.2)
Albumin: 4.4 g/dL (ref 3.8–4.8)
Alkaline Phosphatase: 80 IU/L (ref 48–121)
BUN/Creatinine Ratio: 26 — ABNORMAL HIGH (ref 9–23)
BUN: 17 mg/dL (ref 6–24)
Bilirubin Total: 0.4 mg/dL (ref 0.0–1.2)
CO2: 23 mmol/L (ref 20–29)
Calcium: 9.6 mg/dL (ref 8.7–10.2)
Chloride: 102 mmol/L (ref 96–106)
Creatinine, Ser: 0.66 mg/dL (ref 0.57–1.00)
GFR calc Af Amer: 125 mL/min/{1.73_m2} (ref >59)
GFR calc non Af Amer: 109 mL/min/{1.73_m2} (ref >59)
Globulin, Total: 3.5 g/dL (ref 1.5–4.5)
Glucose: 77 mg/dL (ref 65–99)
Potassium: 4.1 mmol/L (ref 3.5–5.2)
Sodium: 140 mmol/L (ref 134–144)
Total Protein: 7.9 g/dL (ref 6.0–8.5)

## 2019-11-17 LAB — VITAMIN D 25 HYDROXY (VIT D DEFICIENCY, FRACTURES): Vit D, 25-Hydroxy: 29.8 ng/mL — ABNORMAL LOW (ref 30.0–100.0)

## 2019-11-17 LAB — VITAMIN B12: Vitamin B-12: 584 pg/mL (ref 232–1245)

## 2019-11-17 NOTE — Progress Notes (Signed)
Chief Complaint:   OBESITY Christina Rodriguez is here to discuss her progress with her obesity treatment plan along with follow-up of her obesity related diagnoses. Christina Rodriguez is keeping a food journal and adhering to recommended goals of 1500 calories and 90+ grams of protein and states she is following her eating plan approximately 90% of the time. Christina Rodriguez states she is walking 30 minutes 3-4 times per week.  Today's visit was #: 47 Starting weight: 260 lbs Starting date: 12/26/2016 Today's weight: 257 lbs Today's date: 11/16/2019 Total lbs lost to date: 3 Total lbs lost since last in-office visit: 1  Interim History: Christina Rodriguez continues to enjoy the flexibility of the journaling plan. Breakfast is normally cheese toast/egg toast; lunch is lean protein and vegetables; dinner again is protein and vegetables. She will have a protein shake in the evening if she needs to boost her daily protein intake.  Subjective:   Prediabetes. Christina Rodriguez has a diagnosis of prediabetes based on her elevated HgA1c and was informed this puts her at greater risk of developing diabetes. She continues to work on diet and exercise to decrease her risk of diabetes. She denies nausea or hypoglycemia. Christina Rodriguez reports appetite is well controlled on metformin and Victoza.  Lab Results  Component Value Date   HGBA1C 5.6 08/26/2019   Lab Results  Component Value Date   INSULIN 30.0 (H) 08/26/2019   INSULIN 27.1 (H) 06/11/2019   INSULIN 24.5 11/17/2018   INSULIN 34.1 (H) 07/14/2018   INSULIN 16.7 02/18/2018   Elevated LFTs. Christina Rodriguez has a new dx of elevated ALT. Her BMI is over 40. She denies abdominal pain or jaundice and has never been told of any liver problems in the past. She denies excessive alcohol intake. Christina Rodriguez is not on statin therapy. She denies right upper quadrant pain.  Lab Results  Component Value Date   ALT 21 11/16/2019   AST 18 11/16/2019   ALKPHOS 80 11/16/2019   BILITOT 0.4 11/16/2019    Vitamin D deficiency. Christina Rodriguez is on prescription strength Vitamin D supplementation and denies nausea, vomiting, or muscle weakness.    Ref. Range 08/26/2019 11:41  Vitamin D, 25-Hydroxy Latest Ref Range: 30.0 - 100.0 ng/mL 33.8   At risk for diarrhea. Christina Rodriguez is at higher risk of diarrhea due to being on metformin for prediabetes.  Assessment/Plan:   Prediabetes. [name] will continue to work on weight loss, exercise, and decreasing simple carbohydrates to help decrease the risk of diabetes. Refills were given for metFORMIN (GLUCOPHAGE-XR) 500 MG 24 hr tablet 1000 mg BID #180 with 0 refills and liraglutide (VICTOZA) 18 MG/3ML SOPN 1.8 mg daily #9 with 0 refills. Comprehensive metabolic panel, Vitamin B12 ordered today.  Elevated LFTs. We discussed the likely diagnosis of non-alcoholic fatty liver disease today and how this condition is obesity related. Christina Rodriguez was educated the importance of weight loss. Christina Rodriguez agreed to continue with her weight loss efforts with healthier diet and exercise as an essential part of her treatment plan. Will check labs today.  Vitamin D deficiency. Low Vitamin D level contributes to fatigue and are associated with obesity, breast, and colon cancer. She agrees to continue to take prescription Vitamin D as directed (no medication refill today) and VITAMIN D 25 Hydroxy (Vit-D Deficiency, Fractures) level was ordered today.  At risk for diarrhea. Christina Rodriguez was given approximately 15 minutes of diarrhea prevention counseling today. She is 44 y.o. female and has risk factors for diarrhea including medications and changes in diet. We discussed  intensive lifestyle modifications today with an emphasis on specific weight loss instructions including dietary strategies.   Repetitive spaced learning was employed today to elicit superior memory formation and behavioral change.  Class 3 severe obesity with serious comorbidity and body mass index (BMI) of 40.0 to 44.9 in adult,  unspecified obesity type (HCC).  Christina Rodriguez is currently in the action stage of change. As such, her goal is to continue with weight loss efforts. She has agreed to keeping a food journal and adhering to recommended goals of 1500 calories and 90+ grams of protein daily.   Handout was provided on Protein Shake Comparisons.  Exercise goals: As if- walking 3-4 times per week.  Behavioral modification strategies: increasing lean protein intake, meal planning and cooking strategies, celebration eating strategies, planning for success and keeping a strict food journal.  Christina Rodriguez has agreed to follow-up with our clinic in 4 weeks. She was informed of the importance of frequent follow-up visits to maximize her success with intensive lifestyle modifications for her multiple health conditions.   Christina Rodriguez was informed we would discuss her lab results at her next visit unless there is a critical issue that needs to be addressed sooner. Christina Rodriguez agreed to keep her next visit at the agreed upon time to discuss these results.  Objective:   Blood pressure 107/71, pulse 76, temperature 98.5 F (36.9 C), temperature source Oral, height 5\' 7"  (1.702 m), weight 257 lb (116.6 kg), SpO2 99 %. Body mass index is 40.25 kg/m.  General: Cooperative, alert, well developed, in no acute distress. HEENT: Conjunctivae and lids unremarkable. Cardiovascular: Regular rhythm.  Lungs: Normal work of breathing. Neurologic: No focal deficits.   Lab Results  Component Value Date   CREATININE 0.66 11/16/2019   BUN 17 11/16/2019   NA 140 11/16/2019   K 4.1 11/16/2019   CL 102 11/16/2019   CO2 23 11/16/2019   Lab Results  Component Value Date   ALT 21 11/16/2019   AST 18 11/16/2019   ALKPHOS 80 11/16/2019   BILITOT 0.4 11/16/2019   Lab Results  Component Value Date   HGBA1C 5.6 08/26/2019   HGBA1C 5.7 (H) 06/11/2019   HGBA1C 5.5 11/17/2018   HGBA1C 5.4 07/14/2018   HGBA1C 5.2 02/18/2018   Lab Results   Component Value Date   INSULIN 30.0 (H) 08/26/2019   INSULIN 27.1 (H) 06/11/2019   INSULIN 24.5 11/17/2018   INSULIN 34.1 (H) 07/14/2018   INSULIN 16.7 02/18/2018   Lab Results  Component Value Date   TSH 2.360 06/11/2019   Lab Results  Component Value Date   CHOL 139 08/26/2019   HDL 32 (L) 08/26/2019   LDLCALC 92 08/26/2019   TRIG 73 08/26/2019   CHOLHDL 4.8 (H) 06/11/2019   Lab Results  Component Value Date   WBC 7.4 11/17/2018   HGB 13.1 11/17/2018   HCT 40.6 11/17/2018   MCV 88 11/17/2018   PLT 340 11/17/2018   Lab Results  Component Value Date   FERRITIN 35 06/11/2019   Attestation Statements:   Reviewed by clinician on day of visit: allergies, medications, problem list, medical history, surgical history, family history, social history, and previous encounter notes.  I, 08/09/2019, am acting as [employer] manager, NP-C   I have reviewed the above documentation for accuracy and completeness, and I agree with the above. -  [employer], NP

## 2019-12-02 ENCOUNTER — Encounter: Payer: Self-pay | Admitting: Nurse Practitioner

## 2019-12-02 ENCOUNTER — Other Ambulatory Visit: Payer: Self-pay

## 2019-12-02 ENCOUNTER — Ambulatory Visit (INDEPENDENT_AMBULATORY_CARE_PROVIDER_SITE_OTHER): Payer: 59 | Admitting: Nurse Practitioner

## 2019-12-02 VITALS — BP 130/84 | Temp 97.6°F

## 2019-12-02 DIAGNOSIS — R000 Tachycardia, unspecified: Secondary | ICD-10-CM

## 2019-12-02 DIAGNOSIS — I10 Essential (primary) hypertension: Secondary | ICD-10-CM | POA: Diagnosis not present

## 2019-12-02 DIAGNOSIS — R600 Localized edema: Secondary | ICD-10-CM

## 2019-12-02 DIAGNOSIS — R609 Edema, unspecified: Secondary | ICD-10-CM

## 2019-12-02 DIAGNOSIS — E1159 Type 2 diabetes mellitus with other circulatory complications: Secondary | ICD-10-CM | POA: Insufficient documentation

## 2019-12-02 MED ORDER — TORSEMIDE 10 MG PO TABS: 10.0000 mg | ORAL_TABLET | Freq: Every day | ORAL | 0 refills | Status: DC

## 2019-12-02 MED ORDER — DILTIAZEM HCL ER COATED BEADS 240 MG PO CP24: 240.0000 mg | ORAL_CAPSULE | Freq: Every day | ORAL | 0 refills | Status: DC

## 2019-12-02 MED FILL — TORSEMIDE 10 MG TABLET: 10 | 90 days supply | Qty: 90 | Fill #0

## 2019-12-02 MED FILL — DILTIAZEM 24HR ER 240 MG CA: 240 | 90 days supply | Qty: 90 | Fill #0

## 2019-12-02 NOTE — Progress Notes (Signed)
   Subjective:    Patient ID: Christina Rodriguez, female    DOB: 03/09/1976, 44 y.o.   MRN: [id number]  Hypertension This is a chronic problem.  Needs refills on medications. Some have been filled by other providers.  Has a history of VSD and SVT. Needs recheck with cardiologist. No CP/ischemic type pain or SOB. Chronic edema lower extremities not being controlled with HCTZ. Would like to switch medications. Years ago, was on Lasix long term.  Continues follow up with health and wellness program for weight loss. Labs recently done there.  Gets regular physicals with GYN.   Review of Systems     Objective:   Physical Exam NAD. Alert, oriented. Lungs clear. Heart RRR. LE: trace pitting edema.  Today's Vitals   12/02/19 1559  BP: (!) 130/84  Temp: 97.6 F (36.4 C)           Assessment & Plan:   Problem List Items Addressed This Visit      Cardiovascular and Mediastinum   Essential hypertension - Primary   Relevant Medications   torsemide (DEMADEX) 10 MG tablet   diltiazem (CARDIZEM CD) 240 MG 24 hr capsule   Other Relevant Orders   Ambulatory referral to Cardiology     Other   Peripheral edema   Relevant Orders   Ambulatory referral to Cardiology   Tachycardia   Relevant Orders   Ambulatory referral to Cardiology     Meds ordered this encounter  Medications  . torsemide (DEMADEX) 10 MG tablet    Sig: Take 1 tablet (10 mg total) by mouth daily. For BP and fluid    Dispense:  90 tablet    Refill:  0    Order Specific Question:   Supervising Provider    Answer:   Lilyan Punt A [[id number]]  . diltiazem (CARDIZEM CD) 240 MG 24 hr capsule    Sig: Take 1 capsule (240 mg total) by mouth daily.    Dispense:  90 capsule    Refill:  0    Order Specific Question:   Supervising Provider    Answer:   Lilyan Punt A [id number]   Refer to cardiology for routine follow up. Stop HCTZ. Trial of Demadex. Cautioned about watching potassium. Continue follow up with health and  wellness program. Return in about 6 months (around 06/03/2020) for Recheck here if needed .

## 2019-12-03 ENCOUNTER — Encounter: Payer: Self-pay | Admitting: Nurse Practitioner

## 2019-12-15 DIAGNOSIS — L918 Other hypertrophic disorders of the skin: Secondary | ICD-10-CM | POA: Diagnosis not present

## 2019-12-22 MED FILL — VICTOZA 18 MG/3 ML INJECT P: 18 | 90 days supply | Qty: 27 | Fill #0

## 2019-12-27 ENCOUNTER — Ambulatory Visit (INDEPENDENT_AMBULATORY_CARE_PROVIDER_SITE_OTHER): Payer: 59 | Admitting: Adult Health

## 2020-01-11 ENCOUNTER — Telehealth: Payer: Self-pay | Admitting: *Deleted

## 2020-01-11 MED ORDER — FLUCONAZOLE 150 MG PO TABS: ORAL_TABLET | ORAL | 0 refills | Status: DC

## 2020-01-11 NOTE — Telephone Encounter (Signed)
Pt requesting diflucan for yeast infection. Med sent to pharm per protocol. Pt notified.

## 2020-01-24 ENCOUNTER — Encounter (INDEPENDENT_AMBULATORY_CARE_PROVIDER_SITE_OTHER): Payer: Self-pay | Admitting: Adult Health

## 2020-01-24 ENCOUNTER — Ambulatory Visit (INDEPENDENT_AMBULATORY_CARE_PROVIDER_SITE_OTHER): Payer: 59 | Admitting: Adult Health

## 2020-01-24 ENCOUNTER — Other Ambulatory Visit: Payer: Self-pay

## 2020-01-24 VITALS — BP 100/68 | HR 76 | Temp 98.0°F | Ht 67.0 in | Wt 259.0 lb

## 2020-01-24 DIAGNOSIS — E559 Vitamin D deficiency, unspecified: Secondary | ICD-10-CM | POA: Diagnosis not present

## 2020-01-24 DIAGNOSIS — R7303 Prediabetes: Secondary | ICD-10-CM | POA: Diagnosis not present

## 2020-01-24 DIAGNOSIS — Z9189 Other specified personal risk factors, not elsewhere classified: Secondary | ICD-10-CM

## 2020-01-24 DIAGNOSIS — Z6841 Body Mass Index (BMI) 40.0 and over, adult: Secondary | ICD-10-CM | POA: Diagnosis not present

## 2020-01-24 DIAGNOSIS — I10 Essential (primary) hypertension: Secondary | ICD-10-CM

## 2020-01-24 MED ORDER — VITAMIN D (ERGOCALCIFEROL) 1.25 MG (50000 UNIT) PO CAPS: ORAL_CAPSULE | ORAL | 0 refills | Status: DC

## 2020-01-24 MED FILL — VIT D2 1.25 MG (50,000 UNIT: 1.25 MG | 28 days supply | Qty: 4 | Fill #0

## 2020-01-25 LAB — COMPREHENSIVE METABOLIC PANEL WITH GFR
ALT: 15 IU/L (ref 0–32)
AST: 14 IU/L (ref 0–40)
Albumin/Globulin Ratio: 1.4 (ref 1.2–2.2)
Albumin: 4 g/dL (ref 3.8–4.8)
Alkaline Phosphatase: 69 IU/L (ref 44–121)
BUN/Creatinine Ratio: 22 (ref 9–23)
BUN: 13 mg/dL (ref 6–24)
Bilirubin Total: 0.3 mg/dL (ref 0.0–1.2)
CO2: 26 mmol/L (ref 20–29)
Calcium: 9.3 mg/dL (ref 8.7–10.2)
Chloride: 102 mmol/L (ref 96–106)
Creatinine, Ser: 0.58 mg/dL (ref 0.57–1.00)
GFR calc Af Amer: 131 mL/min/1.73
GFR calc non Af Amer: 113 mL/min/1.73
Globulin, Total: 2.9 g/dL (ref 1.5–4.5)
Glucose: 86 mg/dL (ref 65–99)
Potassium: 3.9 mmol/L (ref 3.5–5.2)
Sodium: 141 mmol/L (ref 134–144)
Total Protein: 6.9 g/dL (ref 6.0–8.5)

## 2020-01-25 LAB — HEMOGLOBIN A1C
Est. average glucose Bld gHb Est-mCnc: 123 mg/dL
Hgb A1c MFr Bld: 5.9 % — ABNORMAL HIGH (ref 4.8–5.6)

## 2020-01-25 LAB — INSULIN, RANDOM: INSULIN: 21.2 u[IU]/mL (ref 2.6–24.9)

## 2020-01-26 NOTE — Progress Notes (Signed)
Chief Complaint:   OBESITY Christina Rodriguez is here to discuss her progress with her obesity treatment plan along with follow-up of her obesity related diagnoses. Christina Rodriguez is keeping a food journal and adhering to recommended goals of 1500 calories and 90 grams of protein and states she is following her eating plan approximately 90% of the time. [name] states she is walking 30 minutes 3-4 times per week.  Today's visit was #: 48 Starting weight: 260 lbs Starting date: 12/26/2016 Today's weight: 259 lbs Today's date: 01/24/2020 Total lbs lost to date: 1 Total lbs lost since last in-office visit: 0  Interim History: Christina Rodriguez recently traveled to [facility] with family for a long weekend. Her children are now back in school - 4th and 6th grades, with travel softball and cheer activities. She has been tracking her intake 100% of the time and estimates to hit her protein and calorie goals approximately 90% of the time.  Subjective:   Prediabetes. Jerald has a diagnosis of prediabetes based on her elevated HgA1c and was informed this puts her at greater risk of developing diabetes. She continues to work on diet and exercise to decrease her risk of diabetes. She denies nausea or hypoglycemia. 08/26/2019 A1c 5.6, insulin 30.0, and blood glucose level normal. [name] is on metformin XR 500 mg 2 tabs at breakfast and liraglutide 1.8 mg daily. GFR is greater than 60. She denies hoarseness, difficulty swallowing, or personal/family history of MTC. Blood pressure and heart rate are stable today.  Lab Results  Component Value Date   HGBA1C 5.6 08/26/2019   Lab Results  Component Value Date   INSULIN 30.0 (H) 08/26/2019   INSULIN 27.1 (H) 06/11/2019   INSULIN 24.5 11/17/2018   INSULIN 34.1 (H) 07/14/2018   Vitamin D deficiency. Christina Rodriguez is on Ergocalciferol. No nausea, vomiting, or muscle weakness.    Ref. Range 11/16/2019 16:55  Vitamin D, 25-Hydroxy Latest Ref Range: 30.0 - 100.0 ng/mL  29.8 (L)   Essential hypertension. Blood pressure and heart rate are stable at today's office visit. Christina Rodriguez was seen by her PCP on 12/02/2019 at which time HCTZ was stopped and she was started on torsemide. She was continued on Cardizem and referred to Cardiology.  BP Readings from Last 3 Encounters:  01/24/20 100/68  12/02/19 (!) 130/84  11/16/19 107/71   Lab Results  Component Value Date   CREATININE 0.66 11/16/2019   CREATININE 0.61 08/26/2019   At risk for dehydration. Christina Rodriguez is at increased risk for dehydration due to diuretic being changed from HCTZ to torsemide.  Assessment/Plan:   Prediabetes. Christina Rodriguez will continue to work on weight loss, exercise, and decreasing simple carbohydrates to help decrease the risk of diabetes. Labs will be checked today.  Vitamin D deficiency. Low Vitamin D level contributes to fatigue and are associated with obesity, breast, and colon cancer. She was given a refill on her Vitamin D, Ergocalciferol, (DRISDOL) 1.25 MG (50000 UNIT) CAPS capsule every week #4 with 0 refills and will follow-up for routine testing of Vitamin D, at least 2-3 times per year to avoid over-replacement.   Essential hypertension. Christina Rodriguez is working on healthy weight loss and exercise to improve blood pressure control. We will watch for signs of hypotension as she continues her lifestyle modifications. Labs will be checked today.  At risk for dehydration. Christina Rodriguez was given approximately 15 minutes dehydration prevention counseling today. Christina Rodriguez is at risk for dehydration due to weight loss and current medication(s). She was encouraged to  hydrate and monitor fluid status to avoid dehydration as well as weight loss plateaus.   Class 3 severe obesity with serious comorbidity and body mass index (BMI) of 40.0 to 44.9 in adult, unspecified obesity type (HCC).  Christina Rodriguez is currently in the action stage of change. As such, her goal is to continue with weight loss efforts. She has agreed to  keeping a food journal and adhering to recommended goals of 1500 calories and 90 grams of protein.   Handout was provided on Eating Out Guide.  Exercise goals: Christina Rodriguez will continue her current exercise regimen.   Behavioral modification strategies: increasing lean protein intake, no skipping meals, meal planning and cooking strategies, planning for success and keeping a strict food journal.  Christina Rodriguez has agreed to follow-up with our clinic in 4 weeks. She was informed of the importance of frequent follow-up visits to maximize her success with intensive lifestyle modifications for her multiple health conditions.   Christina Rodriguez was informed we would discuss her lab results at her next visit unless there is a critical issue that needs to be addressed sooner. Christina Rodriguez agreed to keep her next visit at the agreed upon time to discuss these results.  Objective:   Blood pressure 100/68, pulse 76, temperature 98 F (36.7 C), height 5\' 7"  (1.702 m), weight 259 lb (117.5 kg), SpO2 99 %. Body mass index is 40.57 kg/m.  General: Cooperative, alert, well developed, in no acute distress. HEENT: Conjunctivae and lids unremarkable. Cardiovascular: Regular rhythm.  Lungs: Normal work of breathing. Neurologic: No focal deficits.   Lab Results  Component Value Date   HGBA1C 5.6 08/26/2019   HGBA1C 5.7 (H) 06/11/2019   HGBA1C 5.5 11/17/2018   HGBA1C 5.4 07/14/2018   Lab Results  Component Value Date   INSULIN 30.0 (H) 08/26/2019   INSULIN 27.1 (H) 06/11/2019   INSULIN 24.5 11/17/2018   INSULIN 34.1 (H) 07/14/2018   Lab Results  Component Value Date   TSH 2.360 06/11/2019   Lab Results  Component Value Date   CHOL 139 08/26/2019   HDL 32 (L) 08/26/2019   LDLCALC 92 08/26/2019   TRIG 73 08/26/2019   CHOLHDL 4.8 (H) 06/11/2019   Lab Results  Component Value Date   WBC 7.4 11/17/2018   HGB 13.1 11/17/2018   HCT 40.6 11/17/2018   MCV 88 11/17/2018   PLT 340 11/17/2018   Lab Results  Component  Value Date   FERRITIN 35 06/11/2019   Attestation Statements:   Reviewed by clinician on day of visit: allergies, medications, problem list, medical history, surgical history, family history, social history, and previous encounter notes.  I, 08/09/2019, am acting as Marianna [employer] for [employer] manager, NP-C   I have reviewed the above documentation for accuracy and completeness, and I agree with the above. -  [employer], NP

## 2020-01-30 ENCOUNTER — Other Ambulatory Visit: Payer: Self-pay | Admitting: Nurse Practitioner

## 2020-01-30 MED ORDER — FUROSEMIDE 40 MG PO TABS: 40.0000 mg | ORAL_TABLET | Freq: Every day | ORAL | 0 refills | Status: DC

## 2020-01-30 MED ORDER — POTASSIUM CHLORIDE ER 10 MEQ PO TBCR: 10.0000 meq | EXTENDED_RELEASE_TABLET | Freq: Every day | ORAL | 0 refills | Status: DC

## 2020-01-30 NOTE — Progress Notes (Signed)
Phone note from patient:

## 2020-01-31 MED FILL — FUROSEMIDE 40 MG TAB: 40 | 90 days supply | Qty: 90 | Fill #0

## 2020-01-31 MED FILL — POTASSIUM CL ER 10 MEQ TAB: 10 | 90 days supply | Qty: 90 | Fill #0

## 2020-02-03 NOTE — Telephone Encounter (Signed)
error 

## 2020-02-24 ENCOUNTER — Other Ambulatory Visit: Payer: Self-pay

## 2020-02-24 ENCOUNTER — Ambulatory Visit (INDEPENDENT_AMBULATORY_CARE_PROVIDER_SITE_OTHER): Payer: 59 | Admitting: Adult Health

## 2020-02-24 ENCOUNTER — Encounter (INDEPENDENT_AMBULATORY_CARE_PROVIDER_SITE_OTHER): Payer: Self-pay | Admitting: Adult Health

## 2020-02-24 VITALS — BP 117/77 | HR 81 | Temp 98.4°F | Ht 67.0 in | Wt 264.0 lb

## 2020-02-24 DIAGNOSIS — Z6841 Body Mass Index (BMI) 40.0 and over, adult: Secondary | ICD-10-CM | POA: Diagnosis not present

## 2020-02-24 DIAGNOSIS — I152 Hypertension secondary to endocrine disorders: Secondary | ICD-10-CM | POA: Diagnosis not present

## 2020-02-24 DIAGNOSIS — E1159 Type 2 diabetes mellitus with other circulatory complications: Secondary | ICD-10-CM | POA: Diagnosis not present

## 2020-02-24 DIAGNOSIS — E1169 Type 2 diabetes mellitus with other specified complication: Secondary | ICD-10-CM

## 2020-02-24 DIAGNOSIS — I471 Supraventricular tachycardia: Secondary | ICD-10-CM

## 2020-02-24 DIAGNOSIS — I10 Essential (primary) hypertension: Secondary | ICD-10-CM

## 2020-02-24 DIAGNOSIS — R7303 Prediabetes: Secondary | ICD-10-CM

## 2020-02-28 DIAGNOSIS — E119 Type 2 diabetes mellitus without complications: Secondary | ICD-10-CM | POA: Insufficient documentation

## 2020-02-28 DIAGNOSIS — I471 Supraventricular tachycardia: Secondary | ICD-10-CM | POA: Insufficient documentation

## 2020-02-28 NOTE — Progress Notes (Addendum)
Chief Complaint:   OBESITY Christina Rodriguez is here to discuss her progress with her obesity treatment plan along with follow-up of her obesity related diagnoses. Christina Rodriguez is keeping a food journal and adhering to recommended goals of 1500 calories and 90 grams of protein and states she is following her eating plan approximately 95% of the time. Christina Rodriguez states she is walking 30-45 minutes 3-4 times per week.  Today's visit was #: 49 Starting weight: 260 lbs Starting date: 12/26/2016 Today's weight: 264 lbs Today's date: 02/24/2020 Total lbs lost to date: 0 Total lbs lost since last in-office visit: 0  Interim History: The bioimpedance scale reveals 4 out of 5 lb weight gain is water weight. Christina Rodriguez reports increased dyspnea with exertion. She has been on several types and different doses of diuretics since childhood. She has an appointment with Cardiology in December 2021. She has been decreasing her sodium intake.  Subjective:   Hypertension. Blood pressure and heart rate are stable on today's office visit. Christina Rodriguez denies chest pain/chest tightness. She is on diltiazem 240 mg daily and furosemide 40 mg daily with potassium replacement. She reports increased dyspnea with exertion, i.e., walking at the pumpkin patch. Of note, she was on furosemide 80 mg for approximately 15 years. She was converted to HCTZ 25 mg for 3 years, then converted to torsemide 20 mg for 1 year, and is now back on furosemide 40 mg daily.  BP Readings from Last 3 Encounters:  02/24/20 117/77  01/24/20 100/68  12/02/19 (!) 130/84   Lab Results  Component Value Date   CREATININE 0.58 01/24/2020   CREATININE 0.66 11/16/2019   CREATININE 0.61 08/26/2019   Pre-Diabetes Ambulatory fasting blood glucose 100's to 110. Christina Rodriguez is on metformin and liraglutide.  Lab Results  Component Value Date   HGBA1C 5.9 (H) 01/24/2020   HGBA1C 5.6 08/26/2019   HGBA1C 5.7 (H) 06/11/2019   Lab Results  Component Value Date    LDLCALC 92 08/26/2019   CREATININE 0.58 01/24/2020   Lab Results  Component Value Date   INSULIN 21.2 01/24/2020   INSULIN 30.0 (H) 08/26/2019   INSULIN 27.1 (H) 06/11/2019   INSULIN 24.5 11/17/2018   INSULIN 34.1 (H) 07/14/2018   SVT (supraventricular tachycardia) (HCC). 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 She reports dyspnea with exertion. She denies chest pain/chest tightness with exertion.  Assessment/Plan:   Hypertension. Christina Rodriguez is working on healthy weight loss and exercise to improve blood pressure control. We will watch for signs of hypotension as she continues her lifestyle modifications. Christina Rodriguez will follow-up with Cardiology ASAP.  Pre-Diabetes. Good blood sugar control is important to decrease the likelihood of diabetic complications such as nephropathy, neuropathy, limb loss, blindness, coronary artery disease, and death. Intensive lifestyle modification including diet, exercise and weight loss are the first line of treatment for diabetes. Christina Rodriguez will continue her current antidiabetic regimen and will continue to monitor blood glucose levels at home.  SVT (supraventricular tachycardia) (HCC). We recommend Christina Rodriguez move up her appointment with Cardiology; if she is unable to be seen sooner with Cardiology, she is to follow-up with her PCP.  Class 3 severe obesity with serious comorbidity and body mass index (BMI) of 40.0 to 44.9 in adult, unspecified obesity type (HCC).  Christina Rodriguez is currently in the action stage of change. As such, her goal is to continue with weight loss efforts. She has agreed to keeping a food journal and adhering  to recommended goals of 1500 calories and 90 grams of protein daily.   Exercise goals: Christina Rodriguez will continue walking 30-45 minutes 3-4 times per week.  Behavioral modification strategies: increasing lean protein intake, decreasing sodium intake, meal planning and cooking strategies, planning for success  and keeping a strict food journal.  Christina Rodriguez has agreed to follow-up with our clinic in 4 weeks. She was informed of the importance of frequent follow-up visits to maximize her success with intensive lifestyle modifications for her multiple health conditions.   Objective:   Blood pressure 117/77, pulse 81, temperature 98.4 F (36.9 C), height 5\' 7"  (1.702 m), weight 264 lb (119.7 kg), SpO2 98 %. Body mass index is 41.35 kg/m.  General: Cooperative, alert, well developed, in no acute distress. HEENT: Conjunctivae and lids unremarkable. Cardiovascular: Regular rhythm.  Lungs: Normal work of breathing. Neurologic: No focal deficits.   Lab Results  Component Value Date   CREATININE 0.58 01/24/2020   BUN 13 01/24/2020   NA 141 01/24/2020   K 3.9 01/24/2020   CL 102 01/24/2020   CO2 26 01/24/2020   Lab Results  Component Value Date   ALT 15 01/24/2020   AST 14 01/24/2020   ALKPHOS 69 01/24/2020   BILITOT 0.3 01/24/2020   Lab Results  Component Value Date   HGBA1C 5.9 (H) 01/24/2020   HGBA1C 5.6 08/26/2019   HGBA1C 5.7 (H) 06/11/2019   HGBA1C 5.5 11/17/2018   HGBA1C 5.4 07/14/2018   Lab Results  Component Value Date   INSULIN 21.2 01/24/2020   INSULIN 30.0 (H) 08/26/2019   INSULIN 27.1 (H) 06/11/2019   INSULIN 24.5 11/17/2018   INSULIN 34.1 (H) 07/14/2018   Lab Results  Component Value Date   TSH 2.360 06/11/2019   Lab Results  Component Value Date   CHOL 139 08/26/2019   HDL 32 (L) 08/26/2019   LDLCALC 92 08/26/2019   TRIG 73 08/26/2019   CHOLHDL 4.8 (H) 06/11/2019   Lab Results  Component Value Date   WBC 7.4 11/17/2018   HGB 13.1 11/17/2018   HCT 40.6 11/17/2018   MCV 88 11/17/2018   PLT 340 11/17/2018   Lab Results  Component Value Date   FERRITIN 35 06/11/2019   Attestation Statements:   Reviewed by clinician on day of visit: allergies, medications, problem list, medical history, surgical history, family history, social history, and previous  encounter notes.  Time spent on visit including pre-visit chart review and post-visit charting and care was 29 minutes.   I, 08/09/2019, am acting as [employer]na [employer] manager, NP-C   I have reviewed the above documentation for accuracy and completeness, and I agree with the above. -  Ren Aspinall d. Nthony Lefferts, NP-C

## 2020-03-22 ENCOUNTER — Ambulatory Visit (INDEPENDENT_AMBULATORY_CARE_PROVIDER_SITE_OTHER): Payer: 59 | Admitting: Adult Health

## 2020-04-09 ENCOUNTER — Other Ambulatory Visit: Payer: Self-pay | Admitting: Nurse Practitioner

## 2020-04-09 MED ORDER — AMOXICILLIN-POT CLAVULANATE 400-57 MG/5ML PO SUSR: ORAL | 0 refills | Status: DC

## 2020-04-09 NOTE — Progress Notes (Signed)
Phone call from patient. Woke up this am with a very swollen uvula. Touching her tongue making voice muffled. That area is erythematous but otherwise no discoloration. No exudate. No fever. Can get down liquids and saliva. No oral lesions. No obvious lymphadenopathy. No hives or itching.  Has several allergies. Was not exposed to anything as far as she knows. Describes throat as irritated, slightly sore and itchy. Patient was instructed to take antihistamine and wait to see if improved. Has epi pen if needed. After about 3 hours, called back. Some improvement but requests antibiotic. Meds ordered this encounter  Medications  . amoxicillin-clavulanate (AUGMENTIN) 400-57 MG/5ML suspension    Sig: Two tsp po BID x 7 d    Dispense:  140 mL    Refill:  0    Order Specific Question:   Supervising Provider    Answer:   Lilyan Punt A [[id number]]   Recommend office visit within 48-72 hours if no improvement. Seek immediate help if worse.

## 2020-04-10 ENCOUNTER — Ambulatory Visit (INDEPENDENT_AMBULATORY_CARE_PROVIDER_SITE_OTHER): Payer: 59 | Admitting: Family Medicine

## 2020-04-14 ENCOUNTER — Encounter: Payer: Self-pay | Admitting: Nurse Practitioner

## 2020-04-14 ENCOUNTER — Ambulatory Visit (INDEPENDENT_AMBULATORY_CARE_PROVIDER_SITE_OTHER): Payer: 59 | Admitting: Nurse Practitioner

## 2020-04-14 ENCOUNTER — Other Ambulatory Visit: Payer: Self-pay

## 2020-04-14 VITALS — BP 135/85 | HR 94

## 2020-04-14 DIAGNOSIS — Z79899 Other long term (current) drug therapy: Secondary | ICD-10-CM

## 2020-04-14 DIAGNOSIS — R609 Edema, unspecified: Secondary | ICD-10-CM | POA: Diagnosis not present

## 2020-04-14 DIAGNOSIS — R0602 Shortness of breath: Secondary | ICD-10-CM | POA: Diagnosis not present

## 2020-04-14 DIAGNOSIS — R700 Elevated erythrocyte sedimentation rate: Secondary | ICD-10-CM | POA: Diagnosis not present

## 2020-04-14 DIAGNOSIS — R600 Localized edema: Secondary | ICD-10-CM

## 2020-04-14 DIAGNOSIS — D500 Iron deficiency anemia secondary to blood loss (chronic): Secondary | ICD-10-CM | POA: Diagnosis not present

## 2020-04-14 DIAGNOSIS — R5383 Other fatigue: Secondary | ICD-10-CM | POA: Diagnosis not present

## 2020-04-14 NOTE — Progress Notes (Signed)
   Subjective:    Patient ID: Christina Rodriguez, female    DOB: 1976-01-05, 44 y.o.   MRN: [id number]  HPI Presents for follow-up after [facility]  They were concerned about her excessive fluid retention and inability to lose weight.  Has consistently gained more fluid over the past several months.  Concerns regarding her history of VSD and episodes of SVT along with the potential of a cardiac issue causing fluid weight gain.  Was referred to cardiology by our office July 2021. Patient had another appointment scheduled in December with cardiology but the office canceled with her since the physician is leaving.  Initially the earliest appointment they could offer was in July, patient was able to get this moved up to May.  Reports increased dyspnea with exertion.  Currently on furosemide 40 mg daily which is not working as well as before.  No orthopnea, dizziness or syncope.  No cough.  Some fatigue. Occasional brief episodes of SVT which she states she has had for years. No changes.  PMH: also includes iron deficiency anemia. See GYN for her physicals.   Review of Systems     Objective:   Physical Exam NAD. Alert, oriented. Lungs clear. Heart RRR. Gr I/VI faint early systolic murmur heard throughout precordium, loudest at PMI.  06/11/19: elevated sed rate at 71.        Assessment & Plan:   Problem List Items Addressed This Visit      Other   Anemia   Relevant Orders   CBC with Differential (Completed)   B Nat Peptide (Completed)   Ferritin (Completed)   TSH (Completed)   Magnesium (Completed)   Sed Rate (ESR) (Completed)   Other fatigue   Relevant Orders   CBC with Differential (Completed)   B Nat Peptide (Completed)   Ferritin (Completed)   TSH (Completed)   Magnesium (Completed)   Sed Rate (ESR) (Completed)   Peripheral edema   Relevant Orders   CBC with Differential (Completed)   B Nat Peptide (Completed)   Ferritin  (Completed)   TSH (Completed)   Magnesium (Completed)   Sed Rate (ESR) (Completed)    Other Visit Diagnoses    Shortness of breath    -  Primary   Relevant Orders   CBC with Differential (Completed)   B Nat Peptide (Completed)   Ferritin (Completed)   TSH (Completed)   Magnesium (Completed)   Sed Rate (ESR) (Completed)   High risk medication use       Relevant Orders   CBC with Differential (Completed)   B Nat Peptide (Completed)   Ferritin (Completed)   TSH (Completed)   Magnesium (Completed)   Sed Rate (ESR) (Completed)   Elevated sed rate       Relevant Orders   CBC with Differential (Completed)   B Nat Peptide (Completed)   Ferritin (Completed)   TSH (Completed)   Magnesium (Completed)   Sed Rate (ESR) (Completed)     Will check to see if we can get an earlier appointment with cardiology.  Will consult about changing her fluid pill in the meantime.  Patient to seek help immediately in the meantime if she becomes worse.  She agrees with this plan.

## 2020-04-15 LAB — CBC WITH DIFFERENTIAL/PLATELET
Basophils Absolute: 0 10*3/uL (ref 0.0–0.2)
Basos: 0 %
EOS (ABSOLUTE): 0.3 10*3/uL (ref 0.0–0.4)
Eos: 3 %
Hematocrit: 34.8 % (ref 34.0–46.6)
Hemoglobin: 11.1 g/dL (ref 11.1–15.9)
Immature Grans (Abs): 0 10*3/uL (ref 0.0–0.1)
Immature Granulocytes: 0 %
Lymphocytes Absolute: 2.3 10*3/uL (ref 0.7–3.1)
Lymphs: 19 %
MCH: 26.7 pg (ref 26.6–33.0)
MCHC: 31.9 g/dL (ref 31.5–35.7)
MCV: 84 fL (ref 79–97)
Monocytes Absolute: 1 10*3/uL — ABNORMAL HIGH (ref 0.1–0.9)
Monocytes: 8 %
Neutrophils Absolute: 8.5 10*3/uL — ABNORMAL HIGH (ref 1.4–7.0)
Neutrophils: 70 %
Platelets: 413 10*3/uL (ref 150–450)
RBC: 4.15 x10E6/uL (ref 3.77–5.28)
RDW: 13.6 % (ref 11.7–15.4)
WBC: 12.2 10*3/uL — ABNORMAL HIGH (ref 3.4–10.8)

## 2020-04-15 LAB — TSH: TSH: 1.8 u[IU]/mL (ref 0.450–4.500)

## 2020-04-15 LAB — FERRITIN: Ferritin: 22 ng/mL (ref 15–150)

## 2020-04-15 LAB — MAGNESIUM: Magnesium: 2.2 mg/dL (ref 1.6–2.3)

## 2020-04-15 LAB — SEDIMENTATION RATE: Sed Rate: 34 mm/hr — ABNORMAL HIGH (ref 0–32)

## 2020-04-15 LAB — BRAIN NATRIURETIC PEPTIDE: BNP: <2.5 pg/mL (ref 0.0–100.0)

## 2020-04-17 ENCOUNTER — Other Ambulatory Visit: Payer: Self-pay | Admitting: Nurse Practitioner

## 2020-04-17 MED ORDER — POTASSIUM CHLORIDE ER 10 MEQ PO TBCR: EXTENDED_RELEASE_TABLET | ORAL | 0 refills | Status: DC

## 2020-04-17 MED ORDER — FUROSEMIDE 40 MG PO TABS: ORAL_TABLET | ORAL | 0 refills | Status: DC

## 2020-04-20 ENCOUNTER — Ambulatory Visit: Payer: 59 | Admitting: Cardiology

## 2020-04-26 ENCOUNTER — Ambulatory Visit (INDEPENDENT_AMBULATORY_CARE_PROVIDER_SITE_OTHER): Payer: 59 | Admitting: Adult Health

## 2020-04-26 DIAGNOSIS — H5203 Hypermetropia, bilateral: Secondary | ICD-10-CM | POA: Diagnosis not present

## 2020-05-02 NOTE — Progress Notes (Signed)
Cardiology Office Note   Date:  05/12/2020   ID:  [id number] Christina, Rodriguez [birth year], MRN [id number]  PCP:  Babs Sciara, MD/Christina Isabell Jarvis, DNP. FNP  Cardiologist: Formerly Christina Rodriguez  CC: Re-Establish with Cardiology -Assigned to Christina Rodriguez    History of Present Illness: Christina Rodriguez is a very pleasant 44 y.o. female who presents for follow-up at the request of her PCP, Christina Don, DNP, FNP.  She has been followed in the past by Christina Rodriguez with history of SVT, VSD-status post repair.  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    Other history includes asthma, GERD, polycystic ovarian disease, and anemia (due to heavy menses).  She was seen by PCP on 04/14/2020 with worsening shortness of breath, peripheral edema, and fatigue.  The patient had not been seen by cardiology since 2016.  Due to the symptoms she was referred back to cardiology for further assessment.  In the interim CBC, BNP, ferritin magnesium and TSH were ordered.  She is also being followed by [facility] for nutrition and weight loss.  BNP was less than 2.5.  She was found to be mildly anemic with hemoglobin of 11.1 and hematocrit of 34.8.  Ferritin was 22, TSH was normal.  She did have some elevated white blood cells at 12.2 with elevated neutrophils and monocytes.  Results of chemistries have not been loaded, prior chemistries on 01/24/2020 revealed normal sodium potassium creatinine and GFR.Marland Kitchen  EKG was not performed in the office.    She was continued on her current medication regimen to include diltiazem, furosemide 40 mg daily, potassium replacement, and ongoing maintenance of PCOS. Of note the patient was also started on Augmentin due to swollen uvula and itchiness in the back of her throat.She has completed this regimen.  Christina Rodriguez states that she often feels her heart racing throughout the day.  She also has heart racing when she exerts herself at home.  She  denies any chest pain but it is associated with shortness of breath.  She denies any dizziness, excessive fatigue, PND or orthopnea.   Past Medical History:  Diagnosis Date  . Adenomyosis   . Anemia    HIstory of   . Asthma    albuterol inhaler prn-recent uri-using more freq  . Dysrhythmia    history of svt-controlled on cardizem-diagnosed in 1999-  . Gall bladder disease   . GERD (gastroesophageal reflux disease)    uses protonix  . Headache    migraines   . History of polycystic ovarian disease   . Infertility associated with anovulation   . Metabolic syndrome   . Multiple food allergies    shrimp  . Osteoarthritis   . Palpitations   . PCOS (polycystic ovarian syndrome)   . PONV (postoperative nausea and vomiting)   . Prediabetes   . Recurrent upper respiratory infection (URI)    resolving  . SVT (supraventricular tachycardia) (HCC)    History of   . Vitamin D deficiency   . VSD (ventricular septal defect)    history of -no problems    Past Surgical History:  Procedure Laterality Date  . CESAREAN SECTION  2010  . CESAREAN SECTION  11/16/2010   Procedure: CESAREAN SECTION;  Surgeon: Christina Rodriguez;  Location: [facility];  Service: Gynecology;  Laterality: N/A;  Repeat   . CHOLECYSTECTOMY  2003  . DILATION AND CURETTAGE OF UTERUS    . HYSTEROSCOPY WITH D & C N/A  12/06/2014   Procedure: DILATATION AND CURETTAGE /HYSTEROSCOPY;  Surgeon: Christina ReedsKendra Ross, MD;  Location: [facility];  Service: Gynecology;  Laterality: N/A;  . uterine polyps       Current Outpatient Medications  Medication Sig Dispense Refill  . albuterol (VENTOLIN HFA) 108 (90 Base) MCG/ACT inhaler Inhale 2 puffs into the lungs every 4 (four) hours as needed for wheezing or shortness of breath. 18 g 5  . Continuous Blood Gluc Receiver (FREESTYLE LIBRE READER) DEVI Use reader as directed with sensors 1 each 0  . Continuous Blood Gluc Sensor (FREESTYLE LIBRE 14 DAY SENSOR) MISC Apply as directed every 14 days 2 each 11   . cyanocobalamin 500 MCG tablet Take 500 mcg by mouth daily.    Marland Kitchen. diltiazem (CARDIZEM CD) 240 MG 24 hr capsule Take 1 capsule (240 mg total) by mouth daily. 90 capsule 0  . Fluticasone-Salmeterol (ADVAIR DISKUS) 250-50 MCG/DOSE AEPB Inhale 1 puff into the lungs 2 (two) times daily. 60 each 5  . furosemide (LASIX) 40 MG tablet Take 2 tabs (80 mg) po qd 60 tablet 0  . ibuprofen (ADVIL,MOTRIN) 200 MG tablet Take 400 mg by mouth every 6 (six) hours as needed for headache, mild pain or moderate pain.    . Insulin Pen Needle (PEN NEEDLES) 32G X 5 MM MISC Inject 1.8 mg into the skin daily. Use with Victoza to inject into skin 100 each 0  . liraglutide (VICTOZA) 18 MG/3ML SOPN Inject 0.3 mLs (1.8 mg total) into the skin daily. 9 pen 0  . metFORMIN (GLUCOPHAGE-XR) 500 MG 24 hr tablet Take 2 tablets (1,000 mg total) by mouth daily with breakfast. 180 tablet 1  . Multiple Vitamins-Minerals (MULTIVITAMIN ADULT PO) Take 1 tablet by mouth daily.    . ondansetron (ZOFRAN-ODT) 8 MG disintegrating tablet Take 1 tablet (8 mg total) by mouth every 8 (eight) hours as needed for nausea or vomiting. 20 tablet 0  . potassium chloride (KLOR-CON) 10 MEQ tablet Take 2 tabs po qd 60 tablet 0  . rizatriptan (MAXALT-MLT) 10 MG disintegrating tablet Take 1 tablet (10 mg total) by mouth as needed for migraine. May repeat in 2 hours if needed; max 2 per 24 hours 27 tablet 1  . Vitamin D, Ergocalciferol, (DRISDOL) 1.25 MG (50000 UNIT) CAPS capsule TAKE 1 CAPSULE BY MOUTH EVERY 7 DAYS. 4 capsule 0  . EPINEPHrine 0.3 mg/0.3 mL IJ SOAJ injection Inject 0.3 mg into the muscle once. Reported on 05/03/2015    . fluconazole (DIFLUCAN) 150 MG tablet Take one tablet po 3 days apart (Patient not taking: Reported on 05/12/2020) 2 tablet 0  . pantoprazole (PROTONIX) 40 MG tablet Take 1 tablet (40 mg total) by mouth daily. 90 tablet 1   No current facility-administered medications for this visit.    Allergies:   Beta adrenergic blockers,  Shrimp [shellfish allergy], and Soybeans    Social History:  The patient  reports that she has never smoked. She has never used smokeless tobacco. She reports that she does not drink alcohol and does not use drugs.   Family History:  The patient's family history includes Diabetes in her father; Hyperlipidemia in her father; Hypertension in her father; Obesity in her father; Sleep apnea in her father.    ROS: All other systems are reviewed and negative. Unless otherwise mentioned in H&P    PHYSICAL EXAM: VS:  BP 124/82 (BP Location: Left Arm, Patient Position: Sitting)   Pulse 98   Resp 20   Ht 5'  8" (1.727 m)   Wt 265 lb 9.6 oz (120.5 kg)   SpO2 99%   BMI 40.38 kg/m  , BMI Body mass index is 40.38 kg/m. GEN: Well nourished, well developed, in no acute distress, obese HEENT: normal Neck: no JVD, carotid bruits, or masses Cardiac: RRR; tachycardic, 1/6 systolic murmur murmurs, rubs, or gallops,no edema  Respiratory:  Clear to auscultation bilaterally, normal work of breathing GI: soft, nontender, nondistended, + BS MS: no deformity or atrophy Skin: warm and dry, no rash Neuro:  Strength and sensation are intact Psych: euthymic mood, full affect  EKG: Normal sinus rhythm ventricular rate of 98 bpm.  Recent Labs: 01/24/2020: ALT 15; BUN 13; Creatinine, Ser 0.58; Potassium 3.9; Sodium 141 04/14/2020: BNP <2.5; Hemoglobin 11.1; Magnesium 2.2; Platelets 413; TSH 1.800    Lipid Panel    Component Value Date/Time   CHOL 139 08/26/2019 1141   TRIG 73 08/26/2019 1141   HDL 32 (L) 08/26/2019 1141   CHOLHDL 4.8 (H) 06/11/2019 0822   CHOLHDL 4.6 01/20/2014 1317   VLDL 22 01/20/2014 1317   LDLCALC 92 08/26/2019 1141      Wt Readings from Last 3 Encounters:  05/12/20 265 lb 9.6 oz (120.5 kg)  02/24/20 264 lb (119.7 kg)  01/24/20 259 lb (117.5 kg)      Other studies Reviewed: None  ASSESSMENT AND PLAN:  1.  Tachycardia: The patient states she feels tachycardic with  minimal exertion and sometimes during work.  I discussed having a cardiac monitor placed to evaluate for frequency, duration and morphology of rapid heart rhythm.  She has an apple watch where an EKG can be completed and printed.  She would prefer to use the apple watch instead.  I have asked her to please report any rapid heart rate that she is feeling so they can be revisited on follow-up.  If she is unable to record then we will place a 2-week cardiac monitor for definitive evaluation of heart rate.  I reviewed her labs and her TSH is normal, no electrolyte abnormalities are seen.  2.  Dyspnea: Usually occurs with exertion and sometimes with rapid heart rate.  I will get echo ahead and get an echocardiogram for reevaluation of LV function.  With history of VSD would like to make sure that repair is intact.    I also discussed evaluating her with a sleep study.  Her body habitus would contribute to this.  She does state that she snores according to her husband.  Sometimes tachycardia and dyspnea during the day can be related to this.  She denies any episodes of falling asleep during the day at rest.  However, she states that she is always going, and does not have time during the day to rest.  3.  History of asthma: She is on albuterol inhalers.  She states she does not use them that often.  Can consider changing to Xopenex as this has less effect on increasing heart rate.  4.  Fluid retention: She is currently on Lasix 80 mg a day, and states that this is helped her considerably concerning fluid retention.  We did discuss a low-sodium diet.  Also continue with health and wellness nutritional consultation guidelines.  We will have better idea concerning her heart function as it may be contributing to fluid retention versus other etiologies.  5.  Polycystic ovarian disease: Question whether this is also causing fluid retention especially during menses.  She is advised on a low-sodium diet and to  continue  with nutritional recommendations per [facility].  She is not on any hormone replacement therapy at this time.  Remains on Metformin and insulin. :  Current medicines are reviewed at length with the patient today.  I have spent 40 mins dedicated to the care of this patient on the date of this encounter to include pre-visit review of records, assessment, management and diagnostic testing,with shared decision making.  Labs/ tests ordered today include: Echocardiogram Bettey Mare. Liborio Nixon, ANP, AACC   05/12/2020 5:06 PM    [facility] [address] Office [phone] Fax [phone]  Notice: This dictation was prepared with Dragon dictation along with smaller phrase technology. Any transcriptional errors that result from this process are unintentional and may not be corrected upon review.

## 2020-05-12 ENCOUNTER — Encounter: Payer: Self-pay | Admitting: Adult Health

## 2020-05-12 ENCOUNTER — Ambulatory Visit: Payer: 59 | Admitting: Adult Health

## 2020-05-12 ENCOUNTER — Other Ambulatory Visit: Payer: Self-pay

## 2020-05-12 VITALS — BP 124/82 | HR 98 | Resp 20 | Ht 68.0 in | Wt 265.6 lb

## 2020-05-12 DIAGNOSIS — E282 Polycystic ovarian syndrome: Secondary | ICD-10-CM

## 2020-05-12 DIAGNOSIS — D500 Iron deficiency anemia secondary to blood loss (chronic): Secondary | ICD-10-CM | POA: Diagnosis not present

## 2020-05-12 DIAGNOSIS — Q210 Ventricular septal defect: Secondary | ICD-10-CM

## 2020-05-12 DIAGNOSIS — R000 Tachycardia, unspecified: Secondary | ICD-10-CM | POA: Diagnosis not present

## 2020-05-12 DIAGNOSIS — R609 Edema, unspecified: Secondary | ICD-10-CM

## 2020-05-12 DIAGNOSIS — I471 Supraventricular tachycardia: Secondary | ICD-10-CM | POA: Diagnosis not present

## 2020-05-12 NOTE — Patient Instructions (Signed)
Medication Instructions:  Your physician recommends that you continue on your current medications as directed. Please refer to the Current Medication list given to you today.  *If you need a refill on your cardiac medications before your next appointment, please call your pharmacy*   Testing/Procedures: Your physician has requested that you have an echocardiogram. Echocardiography is a painless test that uses sound waves to create images of your heart. It provides your doctor with information about the size and shape of your heart and how well your heart's chambers and valves are working. This procedure takes approximately one hour. There are no restrictions for this procedure. [address] [place], [place] [address]     Follow-Up: At [facility], you and your health needs are our priority.  As part of our continuing mission to provide you with exceptional heart care, we have created designated Provider Care Teams.  These Care Teams include your primary Cardiologist (physician) and Advanced Practice Providers (APPs -  Physician Assistants and Nurse Practitioners) who all work together to provide you with the care you need, when you need it.  We recommend signing up for the patient portal called "MyChart".  Sign up information is provided on this After Visit Summary.  MyChart is used to connect with patients for Virtual Visits (Telemedicine).  Patients are able to view lab/test results, encounter notes, upcoming appointments, etc.  Non-urgent messages can be sent to your provider as well.   To learn more about what you can do with MyChart, go to [email or web address].    Your next appointment:   1 month(s)  The format for your next appointment:   In Person  Provider:   Joni Reining, DNP, ANP

## 2020-05-16 ENCOUNTER — Encounter (INDEPENDENT_AMBULATORY_CARE_PROVIDER_SITE_OTHER): Payer: Self-pay | Admitting: Adult Health

## 2020-05-16 ENCOUNTER — Ambulatory Visit (INDEPENDENT_AMBULATORY_CARE_PROVIDER_SITE_OTHER): Payer: 59 | Admitting: Adult Health

## 2020-05-16 ENCOUNTER — Other Ambulatory Visit: Payer: Self-pay

## 2020-05-16 VITALS — BP 112/70 | HR 94 | Temp 98.1°F | Ht 68.0 in | Wt 259.0 lb

## 2020-05-16 DIAGNOSIS — R7303 Prediabetes: Secondary | ICD-10-CM

## 2020-05-16 DIAGNOSIS — Z9189 Other specified personal risk factors, not elsewhere classified: Secondary | ICD-10-CM | POA: Diagnosis not present

## 2020-05-16 DIAGNOSIS — Z6841 Body Mass Index (BMI) 40.0 and over, adult: Secondary | ICD-10-CM | POA: Diagnosis not present

## 2020-05-16 DIAGNOSIS — I471 Supraventricular tachycardia: Secondary | ICD-10-CM | POA: Diagnosis not present

## 2020-05-16 DIAGNOSIS — E559 Vitamin D deficiency, unspecified: Secondary | ICD-10-CM

## 2020-05-16 DIAGNOSIS — I10 Essential (primary) hypertension: Secondary | ICD-10-CM

## 2020-05-16 MED ORDER — VITAMIN D (ERGOCALCIFEROL) 1.25 MG (50000 UNIT) PO CAPS: ORAL_CAPSULE | ORAL | 0 refills | Status: DC

## 2020-05-16 MED FILL — VIT D2 1.25 MG (50,000 UNIT: 1.25 MG | 28 days supply | Qty: 4 | Fill #0

## 2020-05-17 NOTE — Addendum Note (Signed)
Addended by: Bernita Buffy on: 05/17/2020 04:41 PM   Modules accepted: Orders

## 2020-05-17 NOTE — Progress Notes (Signed)
Chief Complaint:   OBESITY Thara is here to discuss her progress with her obesity treatment plan along with follow-up of her obesity related diagnoses. Ranesha is on keeping a food journal and adhering to recommended goals of 1500 calories and 90 g protein and states she is following her eating plan approximately 90% of the time. Raylen states she is walking 30 minutes 3 times per week.  Today's visit was #: 50 Starting weight: 260 lbs Starting date: 12/26/2016 Today's weight: 259 lbs Today's date: 05/16/2020 Total lbs lost to date: 1 lb Total lbs lost since last in-office visit: 5 lb  Interim History: Walker's 1st goal is to "continue to lose weight". She is not concerned with a specific #. She is scheduled for an ECHO on 05/25/2020 and cardiology doubled Lasix from 40 mg to 80 mg daily. She also has a mammogram scheduled for the end of the month.  Subjective:   1. Vitamin D deficiency Jozy's Vitamin D level was 29.8 on 11/16/2019. She is currently taking prescription vitamin D 50,000 IU each week. She denies nausea, vomiting or muscle weakness.  Ref. Range 11/16/2019 16:55  Vitamin D, 25-Hydroxy Latest Ref Range: 30.0 - 100.0 ng/mL 29.8 (L)   2. Essential hypertension Verdie's blood pressure and heart rate are excellent in the office today. She is on Cardizem 240 mg, Lasix 80 mg (recently increased from 40 mg daily)  3. SVT (supraventricular tachycardia) (HCC) History of VSD - status post repair. Cardiology ordered an ECHO that is scheduled for May 25, 2020.  4. Pre-diabetes 01/24/2020 A1c 5.9 with normal blood glucose and elevated insulin level of 21.2. Ambulatory fasting blood sugar 90-110's. She is on Metformin 1,000 mg with breakfast and Victoza 1.8 mg daily. She is tolerating meds well.  Lab Results  Component Value Date   HGBA1C 5.9 (H) 01/24/2020   Lab Results  Component Value Date   INSULIN 21.2 01/24/2020   INSULIN 30.0 (H) 08/26/2019   INSULIN 27.1 (H)  06/11/2019   INSULIN 24.5 11/17/2018   INSULIN 34.1 (H) 07/14/2018    5. At risk for dehydration Anyah is at risk for dehydration due to an increase in Lasix dosage.  Assessment/Plan:   1. Vitamin D deficiency Low Vitamin D level contributes to fatigue and are associated with obesity, breast, and colon cancer. She agrees to continue to take prescription Vitamin D @50 ,000 IU every week and will follow-up for routine testing of Vitamin D, at least 2-3 times per year to avoid over-replacement. Refill Vit D for 1 month supply, as per below.  - Vitamin D, Ergocalciferol, (DRISDOL) 1.25 MG (50000 UNIT) CAPS capsule; TAKE 1 CAPSULE BY MOUTH EVERY 7 DAYS.  Dispense: 4 capsule; Refill: 0  2. Essential hypertension Continue current anti-hypertensive therapy. Brandalynn is working on healthy weight loss and exercise to improve blood pressure control. We will watch for signs of hypotension as she continues her lifestyle modifications.  3. SVT (supraventricular tachycardia) (HCC) Complete ECHO and follow up with cardiology as directed. Avoid stimulants.  4. Pre-diabetes Chrisie will continue to work on weight loss, exercise, and decreasing simple carbohydrates to help decrease the risk of diabetes. Check labs at next OV.  5. At risk for dehydration Tineshia was given approximately 15 minutes dehydration prevention counseling today. Leilani is at risk for dehydration due to weight loss and current medication(s). She was encouraged to hydrate and monitor fluid status to avoid dehydration as well as weight loss plateaus.   6. Class 3 severe  obesity with serious comorbidity and body mass index (BMI) of 40.0 to 44.9 in adult, unspecified obesity type [facility]) Zakya is currently in the action stage of change. As such, her goal is to continue with weight loss efforts. She has agreed to keeping a food journal and adhering to recommended goals of 1500 calories and 90 g protein.   Exercise goals: As is  Behavioral  modification strategies: increasing lean protein intake, meal planning and cooking strategies, planning for success and keeping a strict food journal.  Olivine has agreed to follow-up with our clinic in 6 weeks fasting. She was informed of the importance of frequent follow-up visits to maximize her success with intensive lifestyle modifications for her multiple health conditions.   Objective:   Blood pressure 112/70, pulse 94, temperature 98.1 F (36.7 C), height 5\' 8"  (1.727 m), weight 259 lb (117.5 kg), SpO2 97 %. Body mass index is 39.38 kg/m.  General: Cooperative, alert, well developed, in no acute distress. HEENT: Conjunctivae and lids unremarkable. Cardiovascular: Regular rhythm.  Lungs: Normal work of breathing. Neurologic: No focal deficits.   Lab Results  Component Value Date   CREATININE 0.58 01/24/2020   BUN 13 01/24/2020   NA 141 01/24/2020   K 3.9 01/24/2020   CL 102 01/24/2020   CO2 26 01/24/2020   Lab Results  Component Value Date   ALT 15 01/24/2020   AST 14 01/24/2020   ALKPHOS 69 01/24/2020   BILITOT 0.3 01/24/2020   Lab Results  Component Value Date   HGBA1C 5.9 (H) 01/24/2020   HGBA1C 5.6 08/26/2019   HGBA1C 5.7 (H) 06/11/2019   HGBA1C 5.5 11/17/2018   HGBA1C 5.4 07/14/2018   Lab Results  Component Value Date   INSULIN 21.2 01/24/2020   INSULIN 30.0 (H) 08/26/2019   INSULIN 27.1 (H) 06/11/2019   INSULIN 24.5 11/17/2018   INSULIN 34.1 (H) 07/14/2018   Lab Results  Component Value Date   TSH 1.800 04/14/2020   Lab Results  Component Value Date   CHOL 139 08/26/2019   HDL 32 (L) 08/26/2019   LDLCALC 92 08/26/2019   TRIG 73 08/26/2019   CHOLHDL 4.8 (H) 06/11/2019   Lab Results  Component Value Date   WBC 12.2 (H) 04/14/2020   HGB 11.1 04/14/2020   HCT 34.8 04/14/2020   MCV 84 04/14/2020   PLT 413 04/14/2020   Lab Results  Component Value Date   FERRITIN 22 04/14/2020    Attestation Statements:   Reviewed by clinician on day  of visit: allergies, medications, problem list, medical history, surgical history, family history, social history, and previous encounter notes.  14/02/2020, am acting as Edmund Hilda for Energy manager, NP.  I have reviewed the above documentation for accuracy and completeness, and I agree with the above. -  Moritz Lever d. Fayne Mcguffee, NP-C

## 2020-05-24 ENCOUNTER — Other Ambulatory Visit: Payer: Self-pay | Admitting: Nurse Practitioner

## 2020-05-24 MED ORDER — FUROSEMIDE 80 MG PO TABS: 80.0000 mg | ORAL_TABLET | Freq: Every day | ORAL | 0 refills | Status: DC

## 2020-05-24 MED ORDER — POTASSIUM CHLORIDE ER 10 MEQ PO TBCR: EXTENDED_RELEASE_TABLET | ORAL | 0 refills | Status: DC

## 2020-05-25 ENCOUNTER — Ambulatory Visit (HOSPITAL_COMMUNITY)
Admission: RE | Admit: 2020-05-25 | Discharge: 2020-05-25 | Disposition: A | Discharge status: Home / Self Care | Payer: 59 | Source: Ambulatory Visit | Attending: Adult Health | Admitting: Adult Health

## 2020-05-25 ENCOUNTER — Other Ambulatory Visit: Payer: Self-pay

## 2020-05-25 DIAGNOSIS — I471 Supraventricular tachycardia: Secondary | ICD-10-CM | POA: Diagnosis not present

## 2020-05-25 LAB — ECHOCARDIOGRAM COMPLETE
Area-P 1/2: 3.66 cm2
S' Lateral: 2.9 cm

## 2020-05-25 NOTE — Progress Notes (Signed)
*  PRELIMINARY RESULTS* Echocardiogram 2D Echocardiogram has been performed.  Stacey Drain 05/25/2020, 2:35 PM

## 2020-05-26 ENCOUNTER — Other Ambulatory Visit: Payer: Self-pay

## 2020-05-26 DIAGNOSIS — I471 Supraventricular tachycardia, unspecified: Secondary | ICD-10-CM

## 2020-05-26 DIAGNOSIS — R000 Tachycardia, unspecified: Secondary | ICD-10-CM

## 2020-05-26 DIAGNOSIS — R002 Palpitations: Secondary | ICD-10-CM

## 2020-06-16 ENCOUNTER — Ambulatory Visit: Payer: 59 | Admitting: Adult Health

## 2020-06-21 NOTE — Progress Notes (Signed)
Cardiology Office Note   Date:  06/23/2020   ID:  [id number] [name], Christina Rodriguez [birth year], MRN [id number]  PCP:  Babs Sciara, MD/[name], FNP   Cardiologist: Formerly Dr. Purvis Sheffield, will need to be established will be assigned to Dr. Cristal Deer   No chief complaint on file.    History of Present Illness: Christina Rodriguez is a very pleasant 45 y.o. female who presents for follow-up at the request of her PCP, Christina Don, DNP, FNP.  She has been followed in the past by Dr. Purvis Sheffield with history of SVT, VSD-status post repair.  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    Other history includes asthma, GERD, polycystic ovarian disease, and anemia (due to heavy menses).  She was seen by PCP on 04/14/2020 with worsening shortness of breath, peripheral edema, and fatigue.  The patient had not been seen by cardiology since 2016.  Due to the symptoms she was referred back to cardiology for further assessment.  In the interim CBC, BNP, ferritin magnesium and TSH were ordered.  She is also being followed by [facility] for nutrition and weight loss.  BNP was less than 2.5.  She was found to be mildly anemic with hemoglobin of 11.1 and hematocrit of 34.8.  Ferritin was 22, TSH was normal.  She did have some elevated white blood cells at 12.2 with elevated neutrophils and monocytes.  Results of chemistries have not been loaded, prior chemistries on 01/24/2020 revealed normal sodium potassium creatinine and GFR..    On last office visit, Christina Rodriguez stated that she often feels her heart racing throughout the day.  She also has heart racing when she exerts herself at home.  She denies any chest pain but it is associated with shortness of breath.  She denies any dizziness, excessive fatigue, PND or orthopnea.  She was to have a cardiac monitor placed but I do not have any records of her wearing the monitor.She also has an Apple watch that can record her heart  rhythms. I ordered an echo on 05/25/2020 to evaluate for changes in heart function, with hx of PFO repair, which did not show any residue shunting from PFO, with normal LVEF of 55%-60%. . She is currently on diltiazem for HR control.   She did not wear the monitor as she did not get instructions by phone from the device manufacturer.  It was sent to her landline and she is no longer using it.  She did bring with her the monitor.  She would like to be able to wear it.  She did show me her apple watch and it appears that she is having frequent PVCs.  She says sometimes when they occur often she does get short of breath.  Past Medical History:  Diagnosis Date  . Adenomyosis   . Anemia    HIstory of   . Asthma    albuterol inhaler prn-recent uri-using more freq  . Dysrhythmia    history of svt-controlled on cardizem-diagnosed in 1999-  . Gall bladder disease   . GERD (gastroesophageal reflux disease)    uses protonix  . Headache    migraines   . History of polycystic ovarian disease   . Infertility associated with anovulation   . Metabolic syndrome   . Multiple food allergies    shrimp  . Osteoarthritis   . Palpitations   . PCOS (polycystic ovarian syndrome)   . PONV (postoperative nausea and vomiting)   .  Prediabetes   . Recurrent upper respiratory infection (URI)    resolving  . SVT (supraventricular tachycardia) (HCC)    History of   . Vitamin D deficiency   . VSD (ventricular septal defect)    history of -no problems    Past Surgical History:  Procedure Laterality Date  . CESAREAN SECTION  2010  . CESAREAN SECTION  11/16/2010   Procedure: CESAREAN SECTION;  Surgeon: Almon Hercules;  Location: [facility];  Service: Gynecology;  Laterality: N/A;  Repeat   . CHOLECYSTECTOMY  2003  . DILATION AND CURETTAGE OF UTERUS    . HYSTEROSCOPY WITH D & C N/A 12/06/2014   Procedure: DILATATION AND CURETTAGE /HYSTEROSCOPY;  Surgeon: Waynard Reeds, MD;  Location: [facility];  Service: Gynecology;   Laterality: N/A;  . uterine polyps       Current Outpatient Medications  Medication Sig Dispense Refill  . albuterol (VENTOLIN HFA) 108 (90 Base) MCG/ACT inhaler Inhale 2 puffs into the lungs every 4 (four) hours as needed for wheezing or shortness of breath. 18 g 5  . Continuous Blood Gluc Receiver (FREESTYLE LIBRE READER) DEVI Use reader as directed with sensors 1 each 0  . Continuous Blood Gluc Sensor (FREESTYLE LIBRE 14 DAY SENSOR) MISC Apply as directed every 14 days 2 each 11  . cyanocobalamin 500 MCG tablet Take 500 mcg by mouth daily.    Marland Kitchen diltiazem (CARDIZEM CD) 240 MG 24 hr capsule Take 1 capsule (240 mg total) by mouth daily. 90 capsule 0  . EPINEPHrine 0.3 mg/0.3 mL IJ SOAJ injection Inject 0.3 mg into the muscle once. Reported on 05/03/2015    . Fluticasone-Salmeterol (ADVAIR DISKUS) 250-50 MCG/DOSE AEPB Inhale 1 puff into the lungs 2 (two) times daily. 60 each 5  . ibuprofen (ADVIL,MOTRIN) 200 MG tablet Take 400 mg by mouth every 6 (six) hours as needed for headache, mild pain or moderate pain.    . Insulin Pen Needle (PEN NEEDLES) 32G X 5 MM MISC Inject 1.8 mg into the skin daily. Use with Victoza to inject into skin 100 each 0  . liraglutide (VICTOZA) 18 MG/3ML SOPN Inject 0.3 mLs (1.8 mg total) into the skin daily. 9 pen 0  . metFORMIN (GLUCOPHAGE-XR) 500 MG 24 hr tablet Take 2 tablets (1,000 mg total) by mouth daily with breakfast. 180 tablet 1  . Multiple Vitamins-Minerals (MULTIVITAMIN ADULT PO) Take 1 tablet by mouth daily.    . ondansetron (ZOFRAN-ODT) 8 MG disintegrating tablet Take 1 tablet (8 mg total) by mouth every 8 (eight) hours as needed for nausea or vomiting. 20 tablet 0  . pantoprazole (PROTONIX) 40 MG tablet Take 1 tablet (40 mg total) by mouth daily. 90 tablet 1  . rizatriptan (MAXALT-MLT) 10 MG disintegrating tablet Take 1 tablet (10 mg total) by mouth as needed for migraine. May repeat in 2 hours if needed; max 2 per 24 hours 27 tablet 1  . Vitamin D,  Ergocalciferol, (DRISDOL) 1.25 MG (50000 UNIT) CAPS capsule TAKE 1 CAPSULE BY MOUTH EVERY 7 DAYS. 4 capsule 0  . furosemide (LASIX) 80 MG tablet Take 1 tablet (80 mg total) by mouth daily. 90 tablet 3  . potassium chloride (KLOR-CON) 10 MEQ tablet Take 2 tablets (20 mEq total) by mouth daily. Take 2 tabs po qd 180 tablet 3   No current facility-administered medications for this visit.    Allergies:   Beta adrenergic blockers, Shrimp [shellfish allergy], and Soybeans    Social History:  The patient  reports  that she has never smoked. She has never used smokeless tobacco. She reports that she does not drink alcohol and does not use drugs.   Family History:  The patient's family history includes Diabetes in her father; Hyperlipidemia in her father; Hypertension in her father; Obesity in her father; Sleep apnea in her father.    ROS: All other systems are reviewed and negative. Unless otherwise mentioned in H&P    PHYSICAL EXAM: VS:  BP 132/84   Pulse 100   Ht 5\' 8"  (1.727 m)   Wt 267 lb 3.2 oz (121.2 kg)   SpO2 99%   BMI 40.63 kg/m  , BMI Body mass index is 40.63 kg/m. GEN: Well nourished, well developed, in no acute distress HEENT: normal Neck: no JVD, carotid bruits, or masses Cardiac: RRR, occasional extrasystole; no murmurs, rubs, or gallops,no edema  Respiratory:  Clear to auscultation bilaterally, normal work of breathing GI: soft, nontender, nondistended, + BS MS: no deformity or atrophy Skin: warm and dry, no rash Neuro:  Strength and sensation are intact Psych: euthymic mood, full affect   EKG: Not completed this office visit  Recent Labs: 01/24/2020: ALT 15; BUN 13; Creatinine, Ser 0.58; Potassium 3.9; Sodium 141 04/14/2020: BNP <2.5; Hemoglobin 11.1; Magnesium 2.2; Platelets 413; TSH 1.800    Lipid Panel    Component Value Date/Time   CHOL 139 08/26/2019 1141   TRIG 73 08/26/2019 1141   HDL 32 (L) 08/26/2019 1141   CHOLHDL 4.8 (H) 06/11/2019 0822   CHOLHDL  4.6 01/20/2014 1317   VLDL 22 01/20/2014 1317   LDLCALC 92 08/26/2019 1141      Wt Readings from Last 3 Encounters:  06/23/20 267 lb 3.2 oz (121.2 kg)  05/16/20 259 lb (117.5 kg)  05/12/20 265 lb 9.6 oz (120.5 kg)      Other studies Reviewed:  Echocardiogram 05/25/2020 1. Left ventricular ejection fraction, by estimation, is 55 to 60%. The  left ventricle has normal function. The left ventricle has no regional  wall motion abnormalities. Left ventricular diastolic parameters were  normal.  2. Reported history of VSD status post repair - no residual shunting is  noted.  3. Right ventricular systolic function is normal. The right ventricular  size is normal. Tricuspid regurgitation signal is inadequate for assessing  PA pressure.  4. The mitral valve is grossly normal. Trivial mitral valve  regurgitation.  5. The aortic valve is tricuspid. Aortic valve regurgitation is not  visualized.  6. The inferior vena cava is normal in size with greater than 50%  respiratory variability, suggesting right atrial pressure of 3 mmHg.    ASSESSMENT AND PLAN:  1.  Frequent PVCs: She is allergic to beta-blockers causing significant shortness of breath.  Will await cardiac monitor for full report once it is been worn for 30 days.  She will place the monitor on today and notify the device clinic that she is placing it to make sure that they are transmitting.  I explained to her that the PVCs are fairly benign and should not be causing a lot of symptoms to her.  I have explained that if she has several PVCs in a row or rapid heart rhythm causing chest pressure or shortness of breath she should report that to 05/27/2020 immediately.    I do not want to change or add any medications at this time.  She is already on diltiazem 240 mg daily, can consider increasing the dose but blood pressure is 132/84 currently and I  do not want to cause hypotension.  This can be discussed further once we get her monitor  results back.  2.  Chronic edema: She is on Lasix 80 mg daily.  I have reviewed her echocardiogram.  She has normal EF.  She states that she does not eat a lot of salty food.  She may need an adrenal work-up to evaluate for other causes of fluid retention with normal heart function.  Thyroid studies were normal.  3.  Asthma: She does have an inhaler which she uses on occasion.  Uncertain if rapid heart rhythm is triggering her symptoms of shortness of breath.   Current medicines are reviewed at length with the patient today.  I have spent 25 min's edicated to the care of this patient on the date of this encounter to include pre-visit review of records, assessment, management and diagnostic testing,with shared decision making.  Labs/ tests ordered today include: None: She is placing the cardiac monitor on before leaving the office.  She will be referred to Dr. Cristal Deerhristopher to be established in our practice as her prior cardiologist, Dr. Purvis SheffieldKoneswaran is no longer in the practice, and she will need to have a new cardiologist.  Follow-up will be made with Dr. Cristal Deerhristopher.   Bettey MareKathryn M. Liborio NixonLawrence DNP, ANP, AACC   06/23/2020 5:20 PM    [facility] [address] Office [phone] Fax [phone]  Notice: This dictation was prepared with Dragon dictation along with smaller phrase technology. Any transcriptional errors that result from this process are unintentional and may not be corrected upon review.

## 2020-06-23 ENCOUNTER — Encounter: Payer: Self-pay | Admitting: Adult Health

## 2020-06-23 ENCOUNTER — Other Ambulatory Visit: Payer: Self-pay

## 2020-06-23 ENCOUNTER — Ambulatory Visit: Payer: 59 | Admitting: Adult Health

## 2020-06-23 ENCOUNTER — Other Ambulatory Visit: Payer: Self-pay | Admitting: Adult Health

## 2020-06-23 VITALS — BP 132/84 | HR 100 | Ht 68.0 in | Wt 267.2 lb

## 2020-06-23 DIAGNOSIS — I471 Supraventricular tachycardia, unspecified: Secondary | ICD-10-CM

## 2020-06-23 DIAGNOSIS — I493 Ventricular premature depolarization: Secondary | ICD-10-CM | POA: Diagnosis not present

## 2020-06-23 MED ORDER — FUROSEMIDE 80 MG PO TABS: 80.0000 mg | ORAL_TABLET | Freq: Every day | ORAL | 3 refills | Status: DC

## 2020-06-23 MED ORDER — POTASSIUM CHLORIDE ER 10 MEQ PO TBCR: 20.0000 meq | EXTENDED_RELEASE_TABLET | Freq: Every day | ORAL | 3 refills | Status: DC

## 2020-06-23 MED FILL — FUROSEMIDE 80 MG TAB: 80 | 90 days supply | Qty: 90 | Fill #0

## 2020-06-23 MED FILL — POTASSIUM CL ER 10 MEQ TAB: 10 | 90 days supply | Qty: 180 | Fill #0

## 2020-06-23 NOTE — Patient Instructions (Signed)
Medication Instructions:  Your physician recommends that you continue on your current medications as directed. Please refer to the Current Medication list given to you today.  *If you need a refill on your cardiac medications before your next appointment, please call your pharmacy*  Lab Work: NONE ordered at this time of appointment   If you have labs (blood work) drawn today and your tests are completely normal, you will receive your results only by: Marland Kitchen MyChart Message (if you have MyChart) OR . A paper copy in the mail If you have any lab test that is abnormal or we need to change your treatment, we will call you to review the results.  Testing/Procedures: NONE ordered at this time of appointment   Follow-Up: At [facility], you and your health needs are our priority.  As part of our continuing mission to provide you with exceptional heart care, we have created designated Provider Care Teams.  These Care Teams include your primary Cardiologist (physician) and Advanced Practice Providers (APPs -  Physician Assistants and Nurse Practitioners) who all work together to provide you with the care you need, when you need it.   Your next appointment:   6-8 week(s)  The format for your next appointment:   In Person  Provider:   Jodelle Red, MD  Other Instructions

## 2020-06-28 ENCOUNTER — Other Ambulatory Visit: Payer: Self-pay | Admitting: Nurse Practitioner

## 2020-06-29 ENCOUNTER — Other Ambulatory Visit (INDEPENDENT_AMBULATORY_CARE_PROVIDER_SITE_OTHER): Payer: Self-pay | Admitting: Adult Health

## 2020-06-29 ENCOUNTER — Other Ambulatory Visit: Payer: Self-pay

## 2020-06-29 ENCOUNTER — Encounter (INDEPENDENT_AMBULATORY_CARE_PROVIDER_SITE_OTHER): Payer: Self-pay | Admitting: Adult Health

## 2020-06-29 ENCOUNTER — Ambulatory Visit (INDEPENDENT_AMBULATORY_CARE_PROVIDER_SITE_OTHER): Payer: 59 | Admitting: Adult Health

## 2020-06-29 VITALS — BP 126/73 | HR 76 | Temp 97.6°F | Ht 68.0 in | Wt 262.0 lb

## 2020-06-29 DIAGNOSIS — I10 Essential (primary) hypertension: Secondary | ICD-10-CM

## 2020-06-29 DIAGNOSIS — E7849 Other hyperlipidemia: Secondary | ICD-10-CM

## 2020-06-29 DIAGNOSIS — E559 Vitamin D deficiency, unspecified: Secondary | ICD-10-CM

## 2020-06-29 DIAGNOSIS — I471 Supraventricular tachycardia: Secondary | ICD-10-CM

## 2020-06-29 DIAGNOSIS — Z9189 Other specified personal risk factors, not elsewhere classified: Secondary | ICD-10-CM | POA: Diagnosis not present

## 2020-06-29 DIAGNOSIS — Z6839 Body mass index (BMI) 39.0-39.9, adult: Secondary | ICD-10-CM

## 2020-06-29 DIAGNOSIS — R7303 Prediabetes: Secondary | ICD-10-CM | POA: Diagnosis not present

## 2020-06-29 MED ORDER — LIRAGLUTIDE 18 MG/3ML ~~LOC~~ SOPN: 1.8000 mg | PEN_INJECTOR | Freq: Every day | SUBCUTANEOUS | 0 refills | Status: DC

## 2020-06-29 MED ORDER — METFORMIN HCL ER 500 MG PO TB24: 1000.0000 mg | ORAL_TABLET | Freq: Every day | ORAL | 0 refills | Status: DC

## 2020-06-29 MED FILL — VICTOZA 18 MG/3 ML INJECT P: 18 | 90 days supply | Qty: 27 | Fill #0

## 2020-06-29 MED FILL — metFORMIN HCL ER 500 MG TB2: 500 | 90 days supply | Qty: 180 | Fill #0

## 2020-06-30 ENCOUNTER — Other Ambulatory Visit: Payer: Self-pay | Admitting: Nurse Practitioner

## 2020-06-30 LAB — COMPREHENSIVE METABOLIC PANEL
ALT: 12 IU/L (ref 0–32)
AST: 13 IU/L (ref 0–40)
Albumin/Globulin Ratio: 1.2 (ref 1.2–2.2)
Albumin: 4.2 g/dL (ref 3.8–4.8)
Alkaline Phosphatase: 85 IU/L (ref 44–121)
BUN/Creatinine Ratio: 20 (ref 9–23)
BUN: 15 mg/dL (ref 6–24)
Bilirubin Total: 0.4 mg/dL (ref 0.0–1.2)
CO2: 21 mmol/L (ref 20–29)
Calcium: 9.2 mg/dL (ref 8.7–10.2)
Chloride: 103 mmol/L (ref 96–106)
Creatinine, Ser: 0.75 mg/dL (ref 0.57–1.00)
GFR calc Af Amer: 112 mL/min/{1.73_m2} (ref >59)
GFR calc non Af Amer: 97 mL/min/{1.73_m2} (ref >59)
Globulin, Total: 3.4 g/dL (ref 1.5–4.5)
Glucose: 81 mg/dL (ref 65–99)
Potassium: 4.4 mmol/L (ref 3.5–5.2)
Sodium: 142 mmol/L (ref 134–144)
Total Protein: 7.6 g/dL (ref 6.0–8.5)

## 2020-06-30 LAB — HEMOGLOBIN A1C
Est. average glucose Bld gHb Est-mCnc: 123 mg/dL
Hgb A1c MFr Bld: 5.9 % — ABNORMAL HIGH (ref 4.8–5.6)

## 2020-06-30 LAB — LIPID PANEL
Chol/HDL Ratio: 5.4 ratio — ABNORMAL HIGH (ref 0.0–4.4)
Cholesterol, Total: 182 mg/dL (ref 100–199)
HDL: 34 mg/dL — ABNORMAL LOW (ref >39)
LDL Chol Calc (NIH): 131 mg/dL — ABNORMAL HIGH (ref 0–99)
Triglycerides: 92 mg/dL (ref 0–149)
VLDL Cholesterol Cal: 17 mg/dL (ref 5–40)

## 2020-06-30 LAB — INSULIN, RANDOM: INSULIN: 17.1 u[IU]/mL (ref 2.6–24.9)

## 2020-06-30 LAB — VITAMIN D 25 HYDROXY (VIT D DEFICIENCY, FRACTURES): Vit D, 25-Hydroxy: 22.5 ng/mL — ABNORMAL LOW (ref 30.0–100.0)

## 2020-06-30 MED ORDER — POTASSIUM CHLORIDE ER 10 MEQ PO TBCR: 20.0000 meq | EXTENDED_RELEASE_TABLET | Freq: Every day | ORAL | 3 refills | Status: DC

## 2020-06-30 MED ORDER — FUROSEMIDE 80 MG PO TABS: 80.0000 mg | ORAL_TABLET | Freq: Every day | ORAL | 3 refills | Status: DC

## 2020-07-01 ENCOUNTER — Ambulatory Visit (INDEPENDENT_AMBULATORY_CARE_PROVIDER_SITE_OTHER): Payer: 59

## 2020-07-01 DIAGNOSIS — R000 Tachycardia, unspecified: Secondary | ICD-10-CM

## 2020-07-01 DIAGNOSIS — I471 Supraventricular tachycardia: Secondary | ICD-10-CM | POA: Diagnosis not present

## 2020-07-01 DIAGNOSIS — R002 Palpitations: Secondary | ICD-10-CM

## 2020-07-03 NOTE — Progress Notes (Signed)
Chief Complaint:   OBESITY Christina Rodriguez is here to discuss her progress with her obesity treatment plan along with follow-up of her obesity related diagnoses. Smt. is on keeping a food journal and adhering to recommended goals of 1500 calories and 90 g protein and states she is following her eating plan approximately 90% of the time. Christina Rodriguez states she is walking 30 minutes 3-4 times per week.  Today's visit was #: 51 Starting weight: 260 lbs Starting date: 12/26/2016 Today's weight: 262 lbs Today's date: 06/29/2020 Total lbs lost to date: 0 Total lbs lost since last in-office visit: 0  Interim History: January 2022 her  ECHO demonstrated an EF 55-60%. Christina Rodriguez has been ordered a 30 day Holter study to address continued dysrhythmias  and occasional PAC/PVC's. She is on Victoza 1.8 mg daily for pre-diabetes.  Subjective:   1. Vitamin D deficiency Christina Rodriguez's Vitamin D level was low at 29.8 on 11/16/2019. She is currently taking prescription vitamin D 50,000 IU each week. She denies nausea, vomiting or muscle weakness.   Ref. Range 11/16/2019 16:55  Vitamin D, 25-Hydroxy Latest Ref Range: 30.0 - 100.0 ng/mL 29.8 (L)   2. Hypertension, unspecified type Christina Rodriguez's BP and heart rate are both excellent at OV today.   BP Readings from Last 3 Encounters:  06/29/20 126/73  06/23/20 132/84  05/16/20 112/70   3. Pre-diabetes Christina Rodriguez is on Metformin 500 mg 2 tabs with breakfast. She is on Victoza 1.8 mg daily. Tolerating both well.  4. Other hyperlipidemia Christina Rodriguez has a history of elevated LDL and low HDL. She is not on statin therapy.  5. SVT (supraventricular tachycardia) (HCC) ECHO on 05/25/2020 resulted EF 55-60%. A 30 Holter study has been ordered to address dysrhythmia and occasional PAC's/PVC's. She will feel it occasionally.  6. At risk for diabetes mellitus Christina Rodriguez is at higher than average risk for developing diabetes due to obesity and pre-diabetes.  Assessment/Plan:   1. Vitamin D  deficiency Low Vitamin D level contributes to fatigue and are associated with obesity, breast, and colon cancer. She agrees to continue to take prescription Vitamin D @50 ,000 IU every week and will follow-up for routine testing of Vitamin D, at least 2-3 times per year to avoid over-replacement. Check labs today.  - VITAMIN D 25 Hydroxy (Vit-D Deficiency, Fractures)  2. Hypertension, unspecified type Christina Rodriguez is working on healthy weight loss and exercise to improve blood pressure control. We will watch for signs of hypotension as she continues her lifestyle modifications. Check labs today.  - Comprehensive metabolic panel  3. Pre-diabetes Christina Rodriguez will continue to work on weight loss, exercise, and decreasing simple carbohydrates to help decrease the risk of diabetes. Check labs today.  - liraglutide (VICTOZA) 18 MG/3ML SOPN; Inject 1.8 mg into the skin daily.  Dispense: 27 mL; Refill: 0 - metFORMIN (GLUCOPHAGE-XR) 500 MG 24 hr tablet; Take 2 tablets (1,000 mg total) by mouth daily with breakfast.  Dispense: 180 tablet; Refill: 0 - Hemoglobin A1c - Insulin, random  4. Other hyperlipidemia Cardiovascular risk and specific lipid/LDL goals reviewed.  We discussed several lifestyle modifications today and Christina Rodriguez will continue to work on diet, exercise and weight loss efforts. Orders and follow up as documented in patient record. Check labs today.  Counseling Intensive lifestyle modifications are the first line treatment for this issue. . Dietary changes: Increase soluble fiber. Decrease simple carbohydrates. . Exercise changes: Moderate to vigorous-intensity aerobic activity 150 minutes per week if tolerated. . Lipid-lowering medications: see documented in medical record. -  Lipid panel  5. SVT (supraventricular tachycardia) (HCC) Complete Holter study and follow up with cardiology as directed.  6. At risk for diabetes mellitus Christina Rodriguez was given approximately 15 minutes of diabetes education  and counseling today. We discussed intensive lifestyle modifications today with an emphasis on weight loss as well as increasing exercise and decreasing simple carbohydrates in her diet. We also reviewed medication options with an emphasis on risk versus benefit of those discussed.   Repetitive spaced learning was employed today to elicit superior memory formation and behavioral change.  7. Class 2 severe obesity with serious comorbidity and body mass index (BMI) of 39.0 to 39.9 in adult, unspecified obesity type Christina Rodriguez) Christina Rodriguez is currently in the action stage of change. As such, her goal is to continue with weight loss efforts. She has agreed to keeping a food journal and adhering to recommended goals of 1500 calories and 90 g protein.   Exercise goals: As is  Behavioral modification strategies: increasing lean protein intake, meal planning and cooking strategies, planning for success and keeping a strict food journal.  Christina Rodriguez has agreed to follow-up with our clinic in 4 weeks. She was informed of the importance of frequent follow-up visits to maximize her success with intensive lifestyle modifications for her multiple health conditions.   Christina Rodriguez was informed we would discuss her lab results at her next visit unless there is a critical issue that needs to be addressed sooner. Christina Rodriguez agreed to keep her next visit at the agreed upon time to discuss these results.  Objective:   Blood pressure 126/73, pulse 76, temperature 97.6 F (36.4 C), height 5\' 8"  (1.727 m), weight 262 lb (118.8 kg), SpO2 100 %. Body mass index is 39.84 kg/m.  General: Cooperative, alert, well developed, in no acute distress. HEENT: Conjunctivae and lids unremarkable. Cardiovascular: Regular rhythm.  Lungs: Normal work of breathing. Neurologic: No focal deficits.   Lab Results  Component Value Date   CREATININE 0.75 06/29/2020   BUN 15 06/29/2020   NA 142 06/29/2020   K 4.4 06/29/2020   CL 103 06/29/2020   CO2 21  06/29/2020   Lab Results  Component Value Date   ALT 12 06/29/2020   AST 13 06/29/2020   ALKPHOS 85 06/29/2020   BILITOT 0.4 06/29/2020   Lab Results  Component Value Date   HGBA1C 5.9 (H) 06/29/2020   HGBA1C 5.9 (H) 01/24/2020   HGBA1C 5.6 08/26/2019   HGBA1C 5.7 (H) 06/11/2019   HGBA1C 5.5 11/17/2018   Lab Results  Component Value Date   INSULIN 17.1 06/29/2020   INSULIN 21.2 01/24/2020   INSULIN 30.0 (H) 08/26/2019   INSULIN 27.1 (H) 06/11/2019   INSULIN 24.5 11/17/2018   Lab Results  Component Value Date   TSH 1.800 04/14/2020   Lab Results  Component Value Date   CHOL 182 06/29/2020   HDL 34 (L) 06/29/2020   LDLCALC 131 (H) 06/29/2020   TRIG 92 06/29/2020   CHOLHDL 5.4 (H) 06/29/2020   Lab Results  Component Value Date   WBC 12.2 (H) 04/14/2020   HGB 11.1 04/14/2020   HCT 34.8 04/14/2020   MCV 84 04/14/2020   PLT 413 04/14/2020   Lab Results  Component Value Date   FERRITIN 22 04/14/2020    Attestation Statements:   Reviewed by clinician on day of visit: allergies, medications, problem list, medical history, surgical history, family history, social history, and previous encounter notes.  14/02/2020, am acting as Edmund Hilda for Energy manager, NP.  I have reviewed the above documentation for accuracy and completeness, and I agree with the above. -  Makalia Bare d. Isaiah Torok, NP-C

## 2020-07-04 ENCOUNTER — Other Ambulatory Visit: Payer: Self-pay | Admitting: Nurse Practitioner

## 2020-07-04 MED ORDER — DILTIAZEM HCL ER COATED BEADS 240 MG PO CP24: 240.0000 mg | ORAL_CAPSULE | Freq: Every day | ORAL | 0 refills | Status: DC

## 2020-07-04 MED FILL — DILTIAZEM 24HR ER 240 MG CA: 240 | 90 days supply | Qty: 90 | Fill #0

## 2020-07-25 ENCOUNTER — Other Ambulatory Visit (HOSPITAL_BASED_OUTPATIENT_CLINIC_OR_DEPARTMENT_OTHER): Payer: Self-pay

## 2020-07-27 ENCOUNTER — Other Ambulatory Visit: Payer: Self-pay

## 2020-07-27 ENCOUNTER — Ambulatory Visit (INDEPENDENT_AMBULATORY_CARE_PROVIDER_SITE_OTHER): Payer: 59 | Admitting: Adult Health

## 2020-07-27 ENCOUNTER — Other Ambulatory Visit (INDEPENDENT_AMBULATORY_CARE_PROVIDER_SITE_OTHER): Payer: Self-pay | Admitting: Adult Health

## 2020-07-27 ENCOUNTER — Encounter (INDEPENDENT_AMBULATORY_CARE_PROVIDER_SITE_OTHER): Payer: Self-pay | Admitting: Adult Health

## 2020-07-27 VITALS — BP 114/70 | HR 94 | Temp 98.0°F | Ht 68.0 in | Wt 263.0 lb

## 2020-07-27 DIAGNOSIS — Z9189 Other specified personal risk factors, not elsewhere classified: Secondary | ICD-10-CM

## 2020-07-27 DIAGNOSIS — E559 Vitamin D deficiency, unspecified: Secondary | ICD-10-CM

## 2020-07-27 DIAGNOSIS — Z6841 Body Mass Index (BMI) 40.0 and over, adult: Secondary | ICD-10-CM | POA: Diagnosis not present

## 2020-07-27 DIAGNOSIS — E7849 Other hyperlipidemia: Secondary | ICD-10-CM

## 2020-07-27 DIAGNOSIS — R7303 Prediabetes: Secondary | ICD-10-CM | POA: Diagnosis not present

## 2020-07-27 MED ORDER — VITAMIN D (ERGOCALCIFEROL) 1.25 MG (50000 UNIT) PO CAPS: ORAL_CAPSULE | ORAL | 0 refills | Status: DC

## 2020-07-27 MED FILL — VIT D2 1.25 MG (50,000 UNIT: 1.25 MG | 24 days supply | Qty: 8 | Fill #0

## 2020-07-31 NOTE — Progress Notes (Addendum)
Chief Complaint:   OBESITY Christina Rodriguez is here to discuss her progress with her obesity treatment plan along with follow-up of her obesity related diagnoses. Christina Rodriguez is on keeping a food journal and adhering to recommended goals of 1500 calories and 90 g protein and states she is following her eating plan approximately 90% of the time. Christina Rodriguez states she is walking 30-45 minutes 3 times per week.  Today's visit was #: 52 Starting weight: 260 lbs Starting date: 12/26/2016 Today's weight: 263 lbs Today's date: 07/27/2020 Total lbs lost to date: 0 Total lbs lost since last in-office visit: 0  Interim History: Christina Rodriguez will complete Holter study this Sunday 07/31/2019. She has experienced intermittent palpitations since study began. She is frustrated with stalled weight loss despite increased protein and exercise. Her last RMR on 02/18/2019 (1683).   Subjective:   1. Vitamin D deficiency Worsening. Discussed labs with patient today. Christina Rodriguez's Vitamin D level was 22.5 on 06/29/2020, which is well below goal of 50. She is currently taking prescription vitamin D 50,000 IU each week with subtherapeutic results. She denies nausea, vomiting or muscle weakness.   Ref. Range 06/29/2020 09:55  Vitamin D, 25-Hydroxy Latest Ref Range: 30.0 - 100.0 ng/mL 22.5 (L)   2. Pre-diabetes Discussed labs with patient today. Christina Rodriguez's 06/29/2020 A1c 5.9, BG 81, and insulin level 17.1. She denies polyphagia. She struggles with eating a full meal in the evening. She is on Metformin and Victoza.   Lab Results  Component Value Date   HGBA1C 5.9 (H) 06/29/2020   Lab Results  Component Value Date   INSULIN 17.1 06/29/2020   INSULIN 21.2 01/24/2020   INSULIN 30.0 (H) 08/26/2019   INSULIN 27.1 (H) 06/11/2019   INSULIN 24.5 11/17/2018    3. Other hyperlipidemia Worsening. Discussed labs with patient today. Christina Rodriguez's 06/29/2020 lipid panel resulted elevated LDL, well above goal of 99. Her father and brother (age 43) both  have HLD , treated with statin. ASCVD 2.9%.   Lab Results  Component Value Date   CHOL 182 06/29/2020   HDL 34 (L) 06/29/2020   LDLCALC 131 (H) 06/29/2020   TRIG 92 06/29/2020   CHOLHDL 5.4 (H) 06/29/2020   Lab Results  Component Value Date   ALT 12 06/29/2020   AST 13 06/29/2020   ALKPHOS 85 06/29/2020   BILITOT 0.4 06/29/2020   The 10-year ASCVD risk score Denman George DC Jr., et al., 2013) is: 2.9%   Values used to calculate the score:     Age: 3 years     Sex: Female     Is Non-Hispanic African American: No     Diabetic: Yes     Tobacco smoker: No     Systolic Blood Pressure: 114 mmHg     Is BP treated: Yes     HDL Cholesterol: 34 mg/dL     Total Cholesterol: 182 mg/dL  4. At risk for heart disease Christina Rodriguez is at a higher than average risk for cardiovascular disease due to obesity, pre-diabetes, and hyperlipidemia.  Assessment/Plan:   1. Vitamin D deficiency Low Vitamin D level contributes to fatigue and are associated with obesity, breast, and colon cancer. She agrees to continue to take prescription Vitamin D @50 ,000 IU every 3 days and will follow-up for routine testing of Vitamin D, at least 2-3 times per year to avoid over-replacement.  - Vitamin D, Ergocalciferol, (DRISDOL) 1.25 MG (50000 UNIT) CAPS capsule; TAKE 1 CAPSULE BY MOUTH EVERY 3 DAYS.  Dispense: 8 capsule; Refill: 0  2. Pre-diabetes Christina Rodriguez will continue to work on weight loss, exercise, and decreasing simple carbohydrates to help decrease the risk of diabetes. Continue current prescriptions and regular exercise.  3. Other hyperlipidemia Cardiovascular risk and specific lipid/LDL goals reviewed.  We discussed several lifestyle modifications today and Christina Rodriguez will continue to work on diet, exercise and weight loss efforts. Orders and follow up as documented in patient record. Check labs every 3 months. Decrease saturated fat intake.  Counseling Intensive lifestyle modifications are the first line treatment for  this issue. . Dietary changes: Increase soluble fiber. Decrease simple carbohydrates. . Exercise changes: Moderate to vigorous-intensity aerobic activity 150 minutes per week if tolerated. . Lipid-lowering medications: see documented in medical record.  4. At risk for heart disease Christina Rodriguez was given approximately 15 minutes of coronary artery disease prevention counseling today. She is 45 y.o. female and has risk factors for heart disease including obesity. We discussed intensive lifestyle modifications today with an emphasis on specific weight loss instructions and strategies.   Repetitive spaced learning was employed today to elicit superior memory formation and behavioral change.  5. Class 3 severe obesity with serious comorbidity and body mass index (BMI) of 40.0 to 44.9 in adult, unspecified obesity type [facility]) Christina Rodriguez is currently in the action stage of change. As such, her goal is to continue with weight loss efforts. She has agreed to keeping a food journal and adhering to recommended goals of 1500 calories and 90 g protein.   Check IC at next OV.  Exercise goals: Per Cardiology  Behavioral modification strategies: increasing lean protein intake, increasing water intake, meal planning and cooking strategies, keeping healthy foods in the home, planning for success and keeping a strict food journal.  Christina Rodriguez has agreed to follow-up with our clinic in 4 weeks. She was informed of the importance of frequent follow-up visits to maximize her success with intensive lifestyle modifications for her multiple health conditions.   Objective:   Blood pressure 114/70, pulse 94, temperature 98 F (36.7 C), height 5\' 8"  (1.727 m), weight 263 lb (119.3 kg), SpO2 98 %. Body mass index is 39.99 kg/m.  General: Cooperative, alert, well developed, in no acute distress. HEENT: Conjunctivae and lids unremarkable. Cardiovascular: Regular rhythm.  Lungs: Normal work of breathing. Neurologic: No focal  deficits.   Lab Results  Component Value Date   CREATININE 0.75 06/29/2020   BUN 15 06/29/2020   NA 142 06/29/2020   K 4.4 06/29/2020   CL 103 06/29/2020   CO2 21 06/29/2020   Lab Results  Component Value Date   ALT 12 06/29/2020   AST 13 06/29/2020   ALKPHOS 85 06/29/2020   BILITOT 0.4 06/29/2020   Lab Results  Component Value Date   HGBA1C 5.9 (H) 06/29/2020   HGBA1C 5.9 (H) 01/24/2020   HGBA1C 5.6 08/26/2019   HGBA1C 5.7 (H) 06/11/2019   HGBA1C 5.5 11/17/2018   Lab Results  Component Value Date   INSULIN 17.1 06/29/2020   INSULIN 21.2 01/24/2020   INSULIN 30.0 (H) 08/26/2019   INSULIN 27.1 (H) 06/11/2019   INSULIN 24.5 11/17/2018   Lab Results  Component Value Date   TSH 1.800 04/14/2020   Lab Results  Component Value Date   CHOL 182 06/29/2020   HDL 34 (L) 06/29/2020   LDLCALC 131 (H) 06/29/2020   TRIG 92 06/29/2020   CHOLHDL 5.4 (H) 06/29/2020   Lab Results  Component Value Date   WBC 12.2 (H) 04/14/2020   HGB 11.1 04/14/2020  HCT 34.8 04/14/2020   MCV 84 04/14/2020   PLT 413 04/14/2020   Lab Results  Component Value Date   FERRITIN 22 04/14/2020   Attestation Statements:   Reviewed by clinician on day of visit: allergies, medications, problem list, medical history, surgical history, family history, social history, and previous encounter notes.  Edmund Hilda, am acting as Energy manager for William Hamburger, NP.  I have reviewed the above documentation for accuracy and completeness, and I agree with the above. -  Jamille Fisher d. Kavir Savoca, NP-C

## 2020-08-18 ENCOUNTER — Other Ambulatory Visit (HOSPITAL_COMMUNITY): Payer: Self-pay

## 2020-08-30 ENCOUNTER — Ambulatory Visit: Payer: 59 | Admitting: Cardiology

## 2020-08-30 ENCOUNTER — Other Ambulatory Visit: Payer: Self-pay | Admitting: Obstetrics and Gynecology

## 2020-08-30 DIAGNOSIS — Z1231 Encounter for screening mammogram for malignant neoplasm of breast: Secondary | ICD-10-CM

## 2020-09-04 ENCOUNTER — Ambulatory Visit (INDEPENDENT_AMBULATORY_CARE_PROVIDER_SITE_OTHER): Payer: 59 | Admitting: Adult Health

## 2020-09-04 ENCOUNTER — Other Ambulatory Visit: Payer: Self-pay

## 2020-09-04 ENCOUNTER — Other Ambulatory Visit (HOSPITAL_COMMUNITY): Payer: Self-pay

## 2020-09-04 ENCOUNTER — Encounter (INDEPENDENT_AMBULATORY_CARE_PROVIDER_SITE_OTHER): Payer: Self-pay | Admitting: Adult Health

## 2020-09-04 VITALS — BP 112/78 | HR 79 | Temp 98.2°F | Ht 68.0 in | Wt 266.0 lb

## 2020-09-04 DIAGNOSIS — E559 Vitamin D deficiency, unspecified: Secondary | ICD-10-CM

## 2020-09-04 DIAGNOSIS — Z9189 Other specified personal risk factors, not elsewhere classified: Secondary | ICD-10-CM

## 2020-09-04 DIAGNOSIS — R7303 Prediabetes: Secondary | ICD-10-CM

## 2020-09-04 DIAGNOSIS — Z6841 Body Mass Index (BMI) 40.0 and over, adult: Secondary | ICD-10-CM | POA: Diagnosis not present

## 2020-09-04 MED ORDER — METFORMIN HCL ER 500 MG PO TB24
ORAL_TABLET | Freq: Every day | ORAL | 0 refills | Status: DC
  Filled 2020-09-04: qty 180, 90d supply, fill #0

## 2020-09-04 MED ORDER — LIRAGLUTIDE 18 MG/3ML ~~LOC~~ SOPN
1.8000 mg | PEN_INJECTOR | Freq: Every day | SUBCUTANEOUS | 0 refills | Status: DC
  Filled 2020-09-04: qty 27, 84d supply, fill #0

## 2020-09-05 NOTE — Progress Notes (Signed)
Chief Complaint:   OBESITY Christina Rodriguez is here to discuss her progress with her obesity treatment plan along with follow-up of her obesity related diagnoses. Christina Rodriguez is on keeping a food journal and adhering to recommended goals of 1500 calories and 90 grams protein and states she is following her eating plan approximately 80-85% of the time. Christina Rodriguez states she is walking 30 minutes 3-4 times per week.  Today's visit was #: 53 Starting weight: 260 lbs Starting date: 12/26/2016 Today's weight: 266 lbs Today's date: 09/04/2020 Total lbs lost to date: 0 Total lbs lost since last in-office visit: 0  Interim History: Christina Rodriguez recently traveled to the beach with her family. She focused on protein and walking frequently.  She estimates to track and hit cal/protein goals both >80% of the time. She is on Victoza 1.8mg  QD (pre-diabetes)- tolerating well.  Subjective:   1. Vitamin D deficiency Christina Rodriguez's Vitamin D level was 22.5 on 06/29/2020. She is currently taking prescription vitamin D 50,000 IU each week. She denies nausea, vomiting or muscle weakness.  2. Pre-diabetes 06/29/2020 A1c 5.9 with normal BG 81 and elevated insulin level of 17.1. Christina Rodriguez is on Metformin 500 mg 2 tabs with breakfast and Victoza 1.8 mg QD. She denies mass in neck, dysphagia, dyspepsia, or persistent hoarseness.   Lab Results  Component Value Date   HGBA1C 5.9 (H) 06/29/2020   Lab Results  Component Value Date   INSULIN 17.1 06/29/2020   INSULIN 21.2 01/24/2020   INSULIN 30.0 (H) 08/26/2019   INSULIN 27.1 (H) 06/11/2019   INSULIN 24.5 11/17/2018   3. At risk for diabetes mellitus Christina Rodriguez is at higher than average risk for developing diabetes due to obesity.   Assessment/Plan:   1. Vitamin D deficiency Low Vitamin D level contributes to fatigue and are associated with obesity, breast, and colon cancer. She agrees to continue to take prescription Vitamin D @50 ,000 IU every week and will follow-up for routine testing  of Vitamin D, at least 2-3 times per year to avoid over-replacement. Check labs at next OV.  2. Pre-diabetes Christina Rodriguez will continue to work on weight loss, exercise, and decreasing simple carbohydrates to help decrease the risk of diabetes.  Check labs at next OV. Consider increasing GLP-1 dose at next OV.  - metFORMIN (GLUCOPHAGE-XR) 500 MG 24 hr tablet; TAKE 2 TABLETS BY MOUTH DAILY WITH BREAKFAST  Dispense: 180 tablet; Refill: 0 - liraglutide (VICTOZA) 18 MG/3ML SOPN; INJECT 1.8 MG INTO THE SKIN DAILY.  Dispense: 27 mL; Refill: 0  3. At risk for diabetes mellitus Christina Rodriguez was given approximately 15 minutes of diabetes education and counseling today. We discussed intensive lifestyle modifications today with an emphasis on weight loss as well as increasing exercise and decreasing simple carbohydrates in her diet. We also reviewed medication options with an emphasis on risk versus benefit of those discussed.   Repetitive spaced learning was employed today to elicit superior memory formation and behavioral change.  4. Class 3 severe obesity with serious comorbidity and body mass index (BMI) of 40.0 to 44.9 in adult, unspecified obesity type Christina Rodriguez) Christina Rodriguez is currently in the action stage of change. As such, her goal is to continue with weight loss efforts. She has agreed to keeping a food journal and adhering to recommended goals of 1500 calories and 90 g protein.   Handout: [employer] Check fasting labs and IC at next OV- arrive 30 minutes prior to scheduled OV time to complete IC.  Exercise goals: As is  Behavioral modification strategies: increasing lean protein intake, meal planning and cooking strategies and planning for success.  Christina Rodriguez has agreed to follow-up with our clinic in 4 weeks with fasting labs and IC. She was informed of the importance of frequent follow-up visits to maximize her success with intensive lifestyle modifications for her multiple health conditions.    Objective:   Blood pressure 112/78, pulse 79, temperature 98.2 F (36.8 C), height 5\' 8"  (1.727 m), weight 266 lb (120.7 kg), SpO2 99 %. Body mass index is 40.45 kg/m.  General: Cooperative, alert, well developed, in no acute distress. HEENT: Conjunctivae and lids unremarkable. Cardiovascular: Regular rhythm.  Lungs: Normal work of breathing. Neurologic: No focal deficits.   Lab Results  Component Value Date   CREATININE 0.75 06/29/2020   BUN 15 06/29/2020   NA 142 06/29/2020   K 4.4 06/29/2020   CL 103 06/29/2020   CO2 21 06/29/2020   Lab Results  Component Value Date   ALT 12 06/29/2020   AST 13 06/29/2020   ALKPHOS 85 06/29/2020   BILITOT 0.4 06/29/2020   Lab Results  Component Value Date   HGBA1C 5.9 (H) 06/29/2020   HGBA1C 5.9 (H) 01/24/2020   HGBA1C 5.6 08/26/2019   HGBA1C 5.7 (H) 06/11/2019   HGBA1C 5.5 11/17/2018   Lab Results  Component Value Date   INSULIN 17.1 06/29/2020   INSULIN 21.2 01/24/2020   INSULIN 30.0 (H) 08/26/2019   INSULIN 27.1 (H) 06/11/2019   INSULIN 24.5 11/17/2018   Lab Results  Component Value Date   TSH 1.800 04/14/2020   Lab Results  Component Value Date   CHOL 182 06/29/2020   HDL 34 (L) 06/29/2020   LDLCALC 131 (H) 06/29/2020   TRIG 92 06/29/2020   CHOLHDL 5.4 (H) 06/29/2020   Lab Results  Component Value Date   WBC 12.2 (H) 04/14/2020   HGB 11.1 04/14/2020   HCT 34.8 04/14/2020   MCV 84 04/14/2020   PLT 413 04/14/2020   Lab Results  Component Value Date   FERRITIN 22 04/14/2020     Attestation Statements:   Reviewed by clinician on day of visit: allergies, medications, problem list, medical history, surgical history, family history, social history, and previous encounter notes.  14/02/2020, am acting as Edmund Hilda for Energy manager, NP.  I have reviewed the above documentation for accuracy and completeness, and I agree with the above. -  Massey Ruhland d. Lashunda Greis, NP-C

## 2020-09-14 ENCOUNTER — Ambulatory Visit: Payer: 59 | Admitting: Cardiology

## 2020-09-26 ENCOUNTER — Other Ambulatory Visit (HOSPITAL_COMMUNITY): Payer: Self-pay

## 2020-09-26 MED ORDER — FUROSEMIDE 80 MG PO TABS
80.0000 mg | ORAL_TABLET | Freq: Every day | ORAL | 2 refills | Status: DC
Start: 2020-06-23 — End: 2021-02-28
  Filled 2020-09-26: qty 90, 90d supply, fill #0
  Filled 2020-12-25: qty 90, 90d supply, fill #1

## 2020-09-29 ENCOUNTER — Other Ambulatory Visit: Payer: Self-pay | Admitting: Nurse Practitioner

## 2020-09-29 ENCOUNTER — Other Ambulatory Visit (HOSPITAL_COMMUNITY): Payer: Self-pay

## 2020-09-29 MED ORDER — FLUCONAZOLE 150 MG PO TABS
ORAL_TABLET | ORAL | 0 refills | Status: DC
  Filled 2020-09-29: qty 2, 4d supply, fill #0

## 2020-10-05 ENCOUNTER — Encounter (INDEPENDENT_AMBULATORY_CARE_PROVIDER_SITE_OTHER): Payer: Self-pay

## 2020-10-06 ENCOUNTER — Telehealth (INDEPENDENT_AMBULATORY_CARE_PROVIDER_SITE_OTHER): Payer: 59 | Admitting: Cardiology

## 2020-10-06 DIAGNOSIS — I471 Supraventricular tachycardia: Secondary | ICD-10-CM | POA: Diagnosis not present

## 2020-10-06 DIAGNOSIS — R600 Localized edema: Secondary | ICD-10-CM | POA: Diagnosis not present

## 2020-10-06 DIAGNOSIS — R0602 Shortness of breath: Secondary | ICD-10-CM

## 2020-10-06 DIAGNOSIS — R002 Palpitations: Secondary | ICD-10-CM | POA: Diagnosis not present

## 2020-10-06 NOTE — Progress Notes (Signed)
Virtual Visit via Video Note   This visit type was conducted due to national recommendations for restrictions regarding the COVID-19 Pandemic (e.g. social distancing) in an effort to limit this patient's exposure and mitigate transmission in our community.  Due to her co-morbid illnesses, this patient is at least at moderate risk for complications without adequate follow up.  This format is felt to be most appropriate for this patient at this time.  All issues noted in this document were discussed and addressed.  A limited physical exam was performed with this format.  Please refer to the patient's chart for her consent to telehealth for [facility].  Video Connection Lost Video connection was lost at > 50% of the duration of this visit, at which time the remainder of the visit was completed via audio only.      Date:  10/06/2020   ID:  Christina Rodriguez, DOB Jul 17, 1975, MRN [id number] The patient was identified using 2 identifiers.  Patient Location: Home Provider Location: Office/Clinic   PCP:  Babs Sciara, MD   [facility] Providers Cardiologist:  Jodelle Red, MD     Evaluation Performed:  Follow-Up Visit  Chief Complaint:  Follow   History of Present Illness:    Christina Rodriguez is a 45 y.o. female with PMH paroxysmal SVT, VSD s/p repair, bilateral LE edema.  The patient does not have symptoms concerning for COVID-19 infection (fever, chills, cough, or new shortness of breath).   Today: Feeling better on lasix compared to HCTZ and torsemide. Less swelling and shortness of breath. Palpitations are better since starting, was once a day, now once a week. Usually lasts only a few minutes.  Follows with healthy weight and wellness, doing well.   Denies chest pain, shortness of breath at rest or with normal exertion. No PND, orthopnea, worsening LE edema or unexpected weight gain. No syncope.   Past Medical History:  Diagnosis Date  . Adenomyosis    . Anemia    HIstory of   . Asthma    albuterol inhaler prn-recent uri-using more freq  . Dysrhythmia    history of svt-controlled on cardizem-diagnosed in 1999-  . Gall bladder disease   . GERD (gastroesophageal reflux disease)    uses protonix  . Headache    migraines   . History of polycystic ovarian disease   . Infertility associated with anovulation   . Metabolic syndrome   . Multiple food allergies    shrimp  . Osteoarthritis   . Palpitations   . PCOS (polycystic ovarian syndrome)   . PONV (postoperative nausea and vomiting)   . Prediabetes   . Recurrent upper respiratory infection (URI)    resolving  . SVT (supraventricular tachycardia) (HCC)    History of   . Vitamin D deficiency   . VSD (ventricular septal defect)    history of -no problems   Past Surgical History:  Procedure Laterality Date  . CESAREAN SECTION  2010  . CESAREAN SECTION  11/16/2010   Procedure: CESAREAN SECTION;  Surgeon: Almon Hercules;  Location: [facility];  Service: Gynecology;  Laterality: N/A;  Repeat   . CHOLECYSTECTOMY  2003  . DILATION AND CURETTAGE OF UTERUS    . HYSTEROSCOPY WITH D & C N/A 12/06/2014   Procedure: DILATATION AND CURETTAGE /HYSTEROSCOPY;  Surgeon: Waynard Reeds, MD;  Location: [facility];  Service: Gynecology;  Laterality: N/A;  . uterine polyps       Current Meds  Medication Sig  .  albuterol (VENTOLIN HFA) 108 (90 Base) MCG/ACT inhaler Inhale 2 puffs into the lungs every 4 (four) hours as needed for wheezing or shortness of breath.  . Continuous Blood Gluc Receiver (FREESTYLE LIBRE READER) DEVI Use reader as directed with sensors  . Continuous Blood Gluc Sensor (FREESTYLE LIBRE 14 DAY SENSOR) MISC Apply as directed every 14 days  . cyanocobalamin 500 MCG tablet Take 500 mcg by mouth daily.  Marland Kitchen. diltiazem (CARDIZEM CD) 240 MG 24 hr capsule TAKE 1 CAPSULE BY MOUTH ONCE A DAY  . EPINEPHrine 0.3 mg/0.3 mL IJ SOAJ injection Inject 0.3 mg into the muscle once. Reported on 05/03/2015  .  fluconazole (DIFLUCAN) 150 MG tablet Take 1 tablet by mouth daily as needed for yeast infection, may repeat in 3-4 days if needed (Patient taking differently: Take 1 tablet by mouth daily as needed for yeast infection, may repeat in 3-4 days if needed)  . Fluticasone-Salmeterol (ADVAIR DISKUS) 250-50 MCG/DOSE AEPB Inhale 1 puff into the lungs 2 (two) times daily.  . furosemide (LASIX) 80 MG tablet Take 1 tablet (80 mg total) by mouth daily.  . furosemide (LASIX) 80 MG tablet Take 1 tablet (80 mg total) by mouth daily.  Marland Kitchen. ibuprofen (ADVIL,MOTRIN) 200 MG tablet Take 400 mg by mouth every 6 (six) hours as needed for headache, mild pain or moderate pain.  . Insulin Pen Needle (PEN NEEDLES) 32G X 5 MM MISC Inject 1.8 mg into the skin daily. Use with Victoza to inject into skin  . liraglutide (VICTOZA) 18 MG/3ML SOPN INJECT 1.8 MG INTO THE SKIN DAILY.  . metFORMIN (GLUCOPHAGE-XR) 500 MG 24 hr tablet TAKE 2 TABLETS BY MOUTH DAILY WITH BREAKFAST  . Multiple Vitamins-Minerals (MULTIVITAMIN ADULT PO) Take 1 tablet by mouth daily.  . ondansetron (ZOFRAN-ODT) 8 MG disintegrating tablet Take 1 tablet (8 mg total) by mouth every 8 (eight) hours as needed for nausea or vomiting.  . pantoprazole (PROTONIX) 40 MG tablet Take 1 tablet (40 mg total) by mouth daily.  . potassium chloride (KLOR-CON) 10 MEQ tablet Take 2 tablets (20 mEq total) by mouth daily. Take 2 tabs po qd  . rizatriptan (MAXALT-MLT) 10 MG disintegrating tablet Take 1 tablet (10 mg total) by mouth as needed for migraine. May repeat in 2 hours if needed; max 2 per 24 hours  . Vitamin D, Ergocalciferol, (DRISDOL) 1.25 MG (50000 UNIT) CAPS capsule TAKE 1 CAPSULE BY MOUTH EVERY 3 DAYS.     Allergies:   Beta adrenergic blockers, Shrimp [shellfish allergy], and Soybeans   Social History   Tobacco Use  . Smoking status: Never Smoker  . Smokeless tobacco: Never Used  Substance Use Topics  . Alcohol use: No  . Drug use: No     Family Hx: The  patient's family history includes Diabetes in her father; Hyperlipidemia in her father; Hypertension in her father; Obesity in her father; Sleep apnea in her father.  ROS:   Please see the history of present illness.    All other systems reviewed and are negative.   Prior CV studies:   The following studies were reviewed today:  Monitor 08/03/20  Minimum HR was 60 BPM, maximum HR wa 138 BPM, the patient was in SR the entire monitoring time.  Occassional PACs and PVCs, no other arrhythmias.   Normal 30 day event monitor, occassional PACs and PVCs, no other arrhythmias.  Echo 05/25/20 1. Left ventricular ejection fraction, by estimation, is 55 to 60%. The  left ventricle has normal function. The  left ventricle has no regional  wall motion abnormalities. Left ventricular diastolic parameters were  normal.  2. Reported history of VSD status post repair - no residual shunting is  noted.  3. Right ventricular systolic function is normal. The right ventricular  size is normal. Tricuspid regurgitation signal is inadequate for assessing  PA pressure.  4. The mitral valve is grossly normal. Trivial mitral valve  regurgitation.  5. The aortic valve is tricuspid. Aortic valve regurgitation is not  visualized.  6. The inferior vena cava is normal in size with greater than 50%  respiratory variability, suggesting right atrial pressure of 3 mmHg.   Labs/Other Tests and Data Reviewed:    EKG:  An ECG dated 05/12/2020 was personally reviewed today and demonstrated:  NSR  Recent Labs: 04/14/2020: BNP <2.5; Hemoglobin 11.1; Magnesium 2.2; Platelets 413; TSH 1.800 06/29/2020: ALT 12; BUN 15; Creatinine, Ser 0.75; Potassium 4.4; Sodium 142   Recent Lipid Panel Lab Results  Component Value Date/Time   CHOL 182 06/29/2020 09:55 AM   TRIG 92 06/29/2020 09:55 AM   HDL 34 (L) 06/29/2020 09:55 AM   CHOLHDL 5.4 (H) 06/29/2020 09:55 AM   CHOLHDL 4.6 01/20/2014 01:17 PM   LDLCALC 131 (H)  06/29/2020 09:55 AM    Wt Readings from Last 3 Encounters:  09/04/20 266 lb (120.7 kg)  07/27/20 263 lb (119.3 kg)  06/29/20 262 lb (118.8 kg)     Risk Assessment/Calculations:      Objective:    Vital Signs:  There were no vitals taken for this visit.   VITAL SIGNS:  reviewed GEN:  no acute distress EYES:  sclerae anicteric, EOMI - Extraocular Movements Intact RESPIRATORY:  normal respiratory effort, symmetric expansion CARDIOVASCULAR:  no visible JVD SKIN:  no rash, lesions or ulcers. MUSCULOSKELETAL:  no obvious deformities. NEURO:  alert and oriented x 3, no obvious focal deficit PSYCH:  normal affect  ASSESSMENT & PLAN:    Bilateral LE edema Chronic shortness of breath -better on lasix; HCTZ and torsemide were not as helpful  Palpitations, history of pSVT -monitor, echo reassuring -symptoms improved with use of lasix  CV risk counseling and prevention: working with [facility] -recommend heart healthy/Mediterranean diet, with whole grains, fruits, vegetable, fish, lean meats, nuts, and olive oil. Limit salt. -recommend moderate walking, 3-5 times/week for 30-50 minutes each session. Aim for at least 150 minutes.week. Goal should be pace of 3 miles/hours, or walking 1.5 miles in 30 minutes -recommend avoidance of tobacco products. Avoid excess alcohol. -ASCVD risk score: The 10-year ASCVD risk score Denman George DC Montez Hageman., et al., 2013) is: 2.8%   Values used to calculate the score:     Age: 47 years     Sex: Female     Is Non-Hispanic African American: No     Diabetic: Yes     Tobacco smoker: No     Systolic Blood Pressure: 112 mmHg     Is BP treated: Yes     HDL Cholesterol: 34 mg/dL     Total Cholesterol: 182 mg/dL   [id number] Education: The signs and symptoms of COVID-19 were discussed with the patient and how to seek care for testing (follow up with PCP or arrange E-visit).  The importance of social distancing was discussed today.  Time:   Today, I  have spent 12 minutes between video and phone with the patient with telehealth technology discussing the above problems.    Patient Instructions  Medication Instructions:  Your Physician recommend  you continue on your current medication as directed.    *If you need a refill on your cardiac medications before your next appointment, please call your pharmacy*   Lab Work: None ordered today   Testing/Procedures: None ordered today   Follow-Up: At [facility], you and your health needs are our priority.  As part of our continuing mission to provide you with exceptional heart care, we have created designated Provider Care Teams.  These Care Teams include your primary Cardiologist (physician) and Advanced Practice Providers (APPs -  Physician Assistants and Nurse Practitioners) who all work together to provide you with the care you need, when you need it.  We recommend signing up for the patient portal called "MyChart".  Sign up information is provided on this After Visit Summary.  MyChart is used to connect with patients for Virtual Visits (Telemedicine).  Patients are able to view lab/test results, encounter notes, upcoming appointments, etc.  Non-urgent messages can be sent to your provider as well.   To learn more about what you can do with MyChart, go to [email or web address].    Your next appointment:   1 year(s) @ [address] [place], [place] [address]   The format for your next appointment:   In Person  Provider:   Jodelle Red, MD      Medication Adjustments/Labs and Tests Ordered: Current medicines are reviewed at length with the patient today.  Concerns regarding medicines are outlined above.   Tests Ordered: No orders of the defined types were placed in this encounter.   Medication Changes: No orders of the defined types were placed in this encounter.   Follow Up:  1 year  Signed, Jodelle Red, MD  10/06/2020 2:16 PM    [facility]

## 2020-10-06 NOTE — Patient Instructions (Signed)
Medication Instructions:  Your Physician recommend you continue on your current medication as directed.    *If you need a refill on your cardiac medications before your next appointment, please call your pharmacy*   Lab Work: None ordered today   Testing/Procedures: None ordered today   Follow-Up: At [facility], you and your health needs are our priority.  As part of our continuing mission to provide you with exceptional heart care, we have created designated Provider Care Teams.  These Care Teams include your primary Cardiologist (physician) and Advanced Practice Providers (APPs -  Physician Assistants and Nurse Practitioners) who all work together to provide you with the care you need, when you need it.  We recommend signing up for the patient portal called "MyChart".  Sign up information is provided on this After Visit Summary.  MyChart is used to connect with patients for Virtual Visits (Telemedicine).  Patients are able to view lab/test results, encounter notes, upcoming appointments, etc.  Non-urgent messages can be sent to your provider as well.   To learn more about what you can do with MyChart, go to [email or web address].    Your next appointment:   1 year(s) @ [address] [place], [place] [address]   The format for your next appointment:   In Person  Provider:   Bridgette Christopher, MD     

## 2020-10-09 ENCOUNTER — Encounter: Payer: Self-pay | Admitting: Cardiology

## 2020-10-24 ENCOUNTER — Other Ambulatory Visit: Payer: Self-pay

## 2020-10-24 ENCOUNTER — Ambulatory Visit
Admission: RE | Admit: 2020-10-24 | Discharge: 2020-10-24 | Disposition: A | Discharge status: Home / Self Care | Payer: 59 | Source: Ambulatory Visit | Attending: Obstetrics and Gynecology | Admitting: Obstetrics and Gynecology

## 2020-10-24 DIAGNOSIS — Z1231 Encounter for screening mammogram for malignant neoplasm of breast: Secondary | ICD-10-CM

## 2020-10-24 DIAGNOSIS — Z01419 Encounter for gynecological examination (general) (routine) without abnormal findings: Secondary | ICD-10-CM | POA: Diagnosis not present

## 2020-11-13 ENCOUNTER — Other Ambulatory Visit (HOSPITAL_COMMUNITY): Payer: Self-pay

## 2020-11-13 ENCOUNTER — Other Ambulatory Visit: Payer: Self-pay

## 2020-11-13 ENCOUNTER — Ambulatory Visit (INDEPENDENT_AMBULATORY_CARE_PROVIDER_SITE_OTHER): Payer: 59 | Admitting: Family Medicine

## 2020-11-13 ENCOUNTER — Encounter (INDEPENDENT_AMBULATORY_CARE_PROVIDER_SITE_OTHER): Payer: Self-pay | Admitting: Family Medicine

## 2020-11-13 ENCOUNTER — Ambulatory Visit (INDEPENDENT_AMBULATORY_CARE_PROVIDER_SITE_OTHER): Payer: 59 | Admitting: Adult Health

## 2020-11-13 VITALS — BP 126/85 | HR 72 | Temp 98.4°F | Ht 68.0 in | Wt 264.0 lb

## 2020-11-13 DIAGNOSIS — E559 Vitamin D deficiency, unspecified: Secondary | ICD-10-CM

## 2020-11-13 DIAGNOSIS — R7303 Prediabetes: Secondary | ICD-10-CM | POA: Diagnosis not present

## 2020-11-13 DIAGNOSIS — Z9189 Other specified personal risk factors, not elsewhere classified: Secondary | ICD-10-CM

## 2020-11-13 DIAGNOSIS — R0602 Shortness of breath: Secondary | ICD-10-CM | POA: Diagnosis not present

## 2020-11-13 DIAGNOSIS — Z6841 Body Mass Index (BMI) 40.0 and over, adult: Secondary | ICD-10-CM | POA: Diagnosis not present

## 2020-11-13 DIAGNOSIS — E7849 Other hyperlipidemia: Secondary | ICD-10-CM | POA: Diagnosis not present

## 2020-11-14 ENCOUNTER — Other Ambulatory Visit (HOSPITAL_COMMUNITY): Payer: Self-pay

## 2020-11-14 LAB — COMPREHENSIVE METABOLIC PANEL
ALT: 15 IU/L (ref 0–32)
AST: 16 IU/L (ref 0–40)
Albumin/Globulin Ratio: 1.3 (ref 1.2–2.2)
Albumin: 4.4 g/dL (ref 3.8–4.8)
Alkaline Phosphatase: 89 IU/L (ref 44–121)
BUN/Creatinine Ratio: 16 (ref 9–23)
BUN: 12 mg/dL (ref 6–24)
Bilirubin Total: 0.4 mg/dL (ref 0.0–1.2)
CO2: 22 mmol/L (ref 20–29)
Calcium: 9.4 mg/dL (ref 8.7–10.2)
Chloride: 101 mmol/L (ref 96–106)
Creatinine, Ser: 0.77 mg/dL (ref 0.57–1.00)
Globulin, Total: 3.4 g/dL (ref 1.5–4.5)
Glucose: 87 mg/dL (ref 65–99)
Potassium: 4.1 mmol/L (ref 3.5–5.2)
Sodium: 140 mmol/L (ref 134–144)
Total Protein: 7.8 g/dL (ref 6.0–8.5)
eGFR: 97 mL/min/{1.73_m2} (ref >59)

## 2020-11-14 LAB — LIPID PANEL WITH LDL/HDL RATIO
Cholesterol, Total: 169 mg/dL (ref 100–199)
HDL: 33 mg/dL — ABNORMAL LOW (ref >39)
LDL Chol Calc (NIH): 118 mg/dL — ABNORMAL HIGH (ref 0–99)
LDL/HDL Ratio: 3.6 ratio — ABNORMAL HIGH (ref 0.0–3.2)
Triglycerides: 99 mg/dL (ref 0–149)
VLDL Cholesterol Cal: 18 mg/dL (ref 5–40)

## 2020-11-14 LAB — HEMOGLOBIN A1C
Est. average glucose Bld gHb Est-mCnc: 117 mg/dL
Hgb A1c MFr Bld: 5.7 % — ABNORMAL HIGH (ref 4.8–5.6)

## 2020-11-14 LAB — INSULIN, RANDOM: INSULIN: 23.8 u[IU]/mL (ref 2.6–24.9)

## 2020-11-14 LAB — VITAMIN D 25 HYDROXY (VIT D DEFICIENCY, FRACTURES): Vit D, 25-Hydroxy: 34.9 ng/mL (ref 30.0–100.0)

## 2020-11-15 NOTE — Progress Notes (Signed)
Chief Complaint:   OBESITY Christina Rodriguez is here to discuss her progress with her obesity treatment plan along with follow-up of her obesity related diagnoses. Christina Rodriguez is on keeping a food journal and adhering to recommended goals of 1500 calories and 90 g protein and states she is following her eating plan approximately 90% of the time. Christina Rodriguez states she is walking 30 minutes 2-3 times per week.  Today's visit was #: 54 Starting weight: 260 lbs Starting date: 12/26/2016 Today's weight: 264 lbs Today's date: 11/13/2020 Total lbs lost to date: 0 Total lbs lost since last in-office visit: 2  Interim History: Christina Rodriguez is feeling frustrated stuck at current weight. The more strict she tries to be, the less the scale seems to move. Her daughter's birthday is in 2 days. She has a couple of weekend trips planned before summer ends.  Subjective:   1. Shortness of breath on exertion Pt's last IC in October 2020. Her symptoms are stable. REE of 2477 today.  2. Pre-diabetes Christina Rodriguez's last A1c was 5.9 and insulin level 17.1. She is on GLP-1 and Metformin.  3. Vitamin D deficiency Christina Rodriguez is on prescription Vit D. She reports fatigue.  4. Other hyperlipidemia Christina Rodriguez is not on a statin. Her last LDL was 131, HDL 34, triglycerides 92.   5. At risk for diabetes mellitus Christina Rodriguez is at higher than average risk for developing diabetes due to obesity.   Assessment/Plan:   1. Shortness of breath on exertion Christina Rodriguez does feel that she gets out of breath more easily that she used to when she exercises. Christina Rodriguez's shortness of breath appears to be obesity related and exercise induced. She has agreed to work on weight loss and gradually increase exercise to treat her exercise induced shortness of breath. Will continue to monitor closely. Check IC today.  2. Pre-diabetes Christina Rodriguez will continue to work on weight loss, exercise, and decreasing simple carbohydrates to help decrease the risk of diabetes.  Check labs  today.  - Hemoglobin A1c - Insulin, random - Comprehensive metabolic panel  3. Vitamin D deficiency Low Vitamin D level contributes to fatigue and are associated with obesity, breast, and colon cancer. She agrees to continue to take prescription Vitamin D @50 ,000 IU every week and will follow-up for routine testing of Vitamin D, at least 2-3 times per year to avoid over-replacement. Check labs today.  - VITAMIN D 25 Hydroxy (Vit-D Deficiency, Fractures)  4. Other hyperlipidemia Cardiovascular risk and specific lipid/LDL goals reviewed.  We discussed several lifestyle modifications today and Samuella will continue to work on diet, exercise and weight loss efforts. Orders and follow up as documented in patient record.   Counseling Intensive lifestyle modifications are the first line treatment for this issue. Dietary changes: Increase soluble fiber. Decrease simple carbohydrates. Exercise changes: Moderate to vigorous-intensity aerobic activity 150 minutes per week if tolerated. Lipid-lowering medications: see documented in medical record. Check labs today.  - Lipid Panel With LDL/HDL Ratio  5. At risk for diabetes mellitus Mende was given approximately 15 minutes of diabetes education and counseling today. We discussed intensive lifestyle modifications today with an emphasis on weight loss as well as increasing exercise and decreasing simple carbohydrates in her diet. We also reviewed medication options with an emphasis on risk versus benefit of those discussed.   Repetitive spaced learning was employed today to elicit superior memory formation and behavioral change.  6. Class 3 severe obesity with serious comorbidity and body mass index (BMI) of 40.0 to 44.9  in adult, unspecified obesity type [facility])  Christina Rodriguez is currently in the action stage of change. As such, her goal is to continue with weight loss efforts. She has agreed to keeping a food journal and adhering to recommended goals of  1800-2000 calories and 130+ g protein.   Exercise goals:  As is  Behavioral modification strategies: increasing lean protein intake, decreasing simple carbohydrates, meal planning and cooking strategies, and keeping a strict food journal.  Christina Rodriguez has agreed to follow-up with our clinic in 4 weeks. She was informed of the importance of frequent follow-up visits to maximize her success with intensive lifestyle modifications for her multiple health conditions.   Christina Rodriguez was informed we would discuss her lab results at her next visit unless there is a critical issue that needs to be addressed sooner. Christina Rodriguez agreed to keep her next visit at the agreed upon time to discuss these results.  Objective:   Blood pressure 126/85, pulse 72, temperature 98.4 F (36.9 C), height 5\' 8"  (1.727 m), weight 264 lb (119.7 kg), SpO2 100 %. Body mass index is 40.14 kg/m.  General: Cooperative, alert, well developed, in no acute distress. HEENT: Conjunctivae and lids unremarkable. Cardiovascular: Regular rhythm.  Lungs: Normal work of breathing. Neurologic: No focal deficits.   Lab Results  Component Value Date   CREATININE 0.77 11/13/2020   BUN 12 11/13/2020   NA 140 11/13/2020   K 4.1 11/13/2020   CL 101 11/13/2020   CO2 22 11/13/2020   Lab Results  Component Value Date   ALT 15 11/13/2020   AST 16 11/13/2020   ALKPHOS 89 11/13/2020   BILITOT 0.4 11/13/2020   Lab Results  Component Value Date   HGBA1C 5.7 (H) 11/13/2020   HGBA1C 5.9 (H) 06/29/2020   HGBA1C 5.9 (H) 01/24/2020   HGBA1C 5.6 08/26/2019   HGBA1C 5.7 (H) 06/11/2019   Lab Results  Component Value Date   INSULIN 23.8 11/13/2020   INSULIN 17.1 06/29/2020   INSULIN 21.2 01/24/2020   INSULIN 30.0 (H) 08/26/2019   INSULIN 27.1 (H) 06/11/2019   Lab Results  Component Value Date   TSH 1.800 04/14/2020   Lab Results  Component Value Date   CHOL 169 11/13/2020   HDL 33 (L) 11/13/2020   LDLCALC 118 (H) 11/13/2020   TRIG 99  11/13/2020   CHOLHDL 5.4 (H) 06/29/2020   Lab Results  Component Value Date   VD25OH 34.9 11/13/2020   VD25OH 22.5 (L) 06/29/2020   VD25OH 29.8 (L) 11/16/2019   Lab Results  Component Value Date   WBC 12.2 (H) 04/14/2020   HGB 11.1 04/14/2020   HCT 34.8 04/14/2020   MCV 84 04/14/2020   PLT 413 04/14/2020   Lab Results  Component Value Date   FERRITIN 22 04/14/2020   Attestation Statements:   Reviewed by clinician on day of visit: allergies, medications, problem list, medical history, surgical history, family history, social history, and previous encounter notes.  14/02/2020, CMA, am acting as transcriptionist for Edmund Hilda, MD.   I have reviewed the above documentation for accuracy and completeness, and I agree with the above. - Reuben Likes, MD

## 2020-11-23 ENCOUNTER — Encounter: Payer: Self-pay | Admitting: Gastroenterology

## 2020-12-12 ENCOUNTER — Other Ambulatory Visit: Payer: Self-pay

## 2020-12-12 ENCOUNTER — Encounter (INDEPENDENT_AMBULATORY_CARE_PROVIDER_SITE_OTHER): Payer: Self-pay | Admitting: Adult Health

## 2020-12-12 ENCOUNTER — Ambulatory Visit (INDEPENDENT_AMBULATORY_CARE_PROVIDER_SITE_OTHER): Payer: 59 | Admitting: Adult Health

## 2020-12-12 ENCOUNTER — Other Ambulatory Visit (HOSPITAL_COMMUNITY): Payer: Self-pay

## 2020-12-12 VITALS — BP 110/71 | HR 91 | Temp 98.1°F | Ht 68.0 in | Wt 259.0 lb

## 2020-12-12 DIAGNOSIS — Z6841 Body Mass Index (BMI) 40.0 and over, adult: Secondary | ICD-10-CM | POA: Diagnosis not present

## 2020-12-12 DIAGNOSIS — E559 Vitamin D deficiency, unspecified: Secondary | ICD-10-CM | POA: Diagnosis not present

## 2020-12-12 DIAGNOSIS — E7849 Other hyperlipidemia: Secondary | ICD-10-CM | POA: Diagnosis not present

## 2020-12-12 DIAGNOSIS — Z9189 Other specified personal risk factors, not elsewhere classified: Secondary | ICD-10-CM

## 2020-12-12 DIAGNOSIS — R7303 Prediabetes: Secondary | ICD-10-CM

## 2020-12-12 MED ORDER — VITAMIN D (ERGOCALCIFEROL) 1.25 MG (50000 UNIT) PO CAPS
ORAL_CAPSULE | ORAL | 0 refills | Status: AC
  Filled 2020-12-12: qty 8, 24d supply, fill #0

## 2020-12-13 NOTE — Progress Notes (Addendum)
Chief Complaint:   OBESITY Christina Rodriguez is here to discuss her progress with her obesity treatment plan along with follow-up of her obesity related diagnoses. Christina Rodriguez is on keeping a food journal and adhering to recommended goals of 1800-2000 calories and 130+ grams of protein and states she is following her eating plan approximately 90% of the time. Christina Rodriguez states she is walking for 30 minutes 3 times per week.  Today's visit was #: 55 Starting weight: 260 lbs Starting date: 12/26/2016 Today's weight: 259 lbs Today's date: 12/12/2020 Total lbs lost to date: 1 lb Total lbs lost since last in-office visit: 5 lbs  Interim History: Christina Rodriguez's RMR was 1683 on 02/18/2019.  It was rechecked at her last office visit on 11/13/2020 and was 2477.  Journaling goals amended to reflect increase in RMR.  She consistently tracks intake.  Most days she will consume a little over 100 grams of protein.  Subjective:   1. Pre-diabetes She is on metformin XR 500 mg 2 tablets each morning.  She is on Victoza 1.8 mg daily.  She denies mass in neck, dysphagia, dyspepsia, or persistent hoarseness.   11/13/2020,-A1c 5.7, BG 87, insulin level 23.8.   Improved A1c with worsening insulin level.  Lab Results  Component Value Date   HGBA1C 5.7 (H) 11/13/2020   Lab Results  Component Value Date   INSULIN 23.8 11/13/2020   INSULIN 17.1 06/29/2020   INSULIN 21.2 01/24/2020   INSULIN 30.0 (H) 08/26/2019   INSULIN 27.1 (H) 06/11/2019   2. Vitamin D deficiency Vitamin D level on 11/13/2020, 34.9, well below goal of 50.  She is currently taking prescription vitamin D 50,000 IU each week. She denies nausea, vomiting or muscle weakness.  Lab Results  Component Value Date   VD25OH 34.9 11/13/2020   VD25OH 22.5 (L) 06/29/2020   VD25OH 29.8 (L) 11/16/2019   3. Other hyperlipidemia Her father and brother (age 80) both on statin therapy.  On 11/13/2020, lipid panel showed HDL below goal with improved LDL and total  cholesterol.  She is not on statin therapy.  She denies tobacco/vape use.  Lab Results  Component Value Date   ALT 15 11/13/2020   AST 16 11/13/2020   ALKPHOS 89 11/13/2020   BILITOT 0.4 11/13/2020   Lab Results  Component Value Date   CHOL 169 11/13/2020   HDL 33 (L) 11/13/2020   LDLCALC 118 (H) 11/13/2020   TRIG 99 11/13/2020   CHOLHDL 5.4 (H) 06/29/2020   4. At risk for heart disease Christina Rodriguez is at a higher than average risk for cardiovascular disease due to obesity, prediabetes, and hyperlipidemia.    Assessment/Plan:   1. Pre-diabetes Continue current prescription regimen.  2. Vitamin D deficiency Refill vitamin D 50,000 IU once weekly, as per below.  - Refill Vitamin D, Ergocalciferol, (DRISDOL) 1.25 MG (50000 UNIT) CAPS capsule; TAKE 1 CAPSULE BY MOUTH EVERY 3 DAYS.  Dispense: 8 capsule; Refill: 0  3. Other hyperlipidemia Monitor labs.  4. At risk for heart disease Christina Rodriguez was given approximately 15 minutes of coronary artery disease prevention counseling today. She is 45 y.o. female and has risk factors for heart disease including obesity. We discussed intensive lifestyle modifications today with an emphasis on specific weight loss instructions and strategies.   Repetitive spaced learning was employed today to elicit superior memory formation and behavioral change.   5. Obesity with current BMI 39.4  Christina Rodriguez is currently in the action stage of change. As such, her goal is  to continue with weight loss efforts. She has agreed to keeping a food journal and adhering to recommended goals of 1800-2000 calories and 130 grams of protein.   Exercise goals:  As is.  Behavioral modification strategies: increasing lean protein intake, decreasing simple carbohydrates, meal planning and cooking strategies, keeping healthy foods in the home, and planning for success.  Christina Rodriguez has agreed to follow-up with our clinic in 4 weeks. She was informed of the importance of frequent follow-up  visits to maximize her success with intensive lifestyle modifications for her multiple health conditions.   Objective:   Blood pressure 110/71, pulse 91, temperature 98.1 F (36.7 C), height 5\' 8"  (1.727 m), weight 259 lb (117.5 kg), SpO2 96 %. Body mass index is 39.38 kg/m.  General: Cooperative, alert, well developed, in no acute distress. HEENT: Conjunctivae and lids unremarkable. Cardiovascular: Regular rhythm.  Lungs: Normal work of breathing. Neurologic: No focal deficits.   Lab Results  Component Value Date   CREATININE 0.77 11/13/2020   BUN 12 11/13/2020   NA 140 11/13/2020   K 4.1 11/13/2020   CL 101 11/13/2020   CO2 22 11/13/2020   Lab Results  Component Value Date   ALT 15 11/13/2020   AST 16 11/13/2020   ALKPHOS 89 11/13/2020   BILITOT 0.4 11/13/2020   Lab Results  Component Value Date   HGBA1C 5.7 (H) 11/13/2020   HGBA1C 5.9 (H) 06/29/2020   HGBA1C 5.9 (H) 01/24/2020   HGBA1C 5.6 08/26/2019   HGBA1C 5.7 (H) 06/11/2019   Lab Results  Component Value Date   INSULIN 23.8 11/13/2020   INSULIN 17.1 06/29/2020   INSULIN 21.2 01/24/2020   INSULIN 30.0 (H) 08/26/2019   INSULIN 27.1 (H) 06/11/2019   Lab Results  Component Value Date   TSH 1.800 04/14/2020   Lab Results  Component Value Date   CHOL 169 11/13/2020   HDL 33 (L) 11/13/2020   LDLCALC 118 (H) 11/13/2020   TRIG 99 11/13/2020   CHOLHDL 5.4 (H) 06/29/2020   Lab Results  Component Value Date   VD25OH 34.9 11/13/2020   VD25OH 22.5 (L) 06/29/2020   VD25OH 29.8 (L) 11/16/2019   Lab Results  Component Value Date   WBC 12.2 (H) 04/14/2020   HGB 11.1 04/14/2020   HCT 34.8 04/14/2020   MCV 84 04/14/2020   PLT 413 04/14/2020   Lab Results  Component Value Date   FERRITIN 22 04/14/2020   Attestation Statements:   Reviewed by clinician on day of visit: allergies, medications, problem list, medical history, surgical history, family history, social history, and previous encounter  notes.  I, 14/02/2020, CMA, am acting as Insurance claims handler for Energy manager, NP.  I have reviewed the above documentation for accuracy and completeness, and I agree with the above. -  Addalie Calles d. Prentis Langdon, NP-C

## 2020-12-22 ENCOUNTER — Ambulatory Visit: Payer: 59 | Admitting: Nurse Practitioner

## 2020-12-25 ENCOUNTER — Other Ambulatory Visit (HOSPITAL_COMMUNITY): Payer: Self-pay

## 2020-12-27 ENCOUNTER — Other Ambulatory Visit (HOSPITAL_COMMUNITY): Payer: Self-pay

## 2020-12-28 ENCOUNTER — Encounter: Payer: Self-pay | Admitting: Gastroenterology

## 2021-01-16 ENCOUNTER — Other Ambulatory Visit: Payer: Self-pay

## 2021-01-16 ENCOUNTER — Ambulatory Visit (INDEPENDENT_AMBULATORY_CARE_PROVIDER_SITE_OTHER): Payer: 59 | Admitting: Adult Health

## 2021-01-16 ENCOUNTER — Encounter (INDEPENDENT_AMBULATORY_CARE_PROVIDER_SITE_OTHER): Payer: Self-pay | Admitting: Adult Health

## 2021-01-16 ENCOUNTER — Other Ambulatory Visit (HOSPITAL_COMMUNITY): Payer: Self-pay

## 2021-01-16 VITALS — BP 109/72 | HR 86 | Temp 98.2°F | Ht 68.0 in | Wt 262.0 lb

## 2021-01-16 DIAGNOSIS — Z9189 Other specified personal risk factors, not elsewhere classified: Secondary | ICD-10-CM

## 2021-01-16 DIAGNOSIS — R7303 Prediabetes: Secondary | ICD-10-CM

## 2021-01-16 DIAGNOSIS — Z6841 Body Mass Index (BMI) 40.0 and over, adult: Secondary | ICD-10-CM

## 2021-01-16 MED ORDER — LIRAGLUTIDE 18 MG/3ML ~~LOC~~ SOPN
1.8000 mg | PEN_INJECTOR | Freq: Every day | SUBCUTANEOUS | 0 refills | Status: DC
  Filled 2021-01-16: qty 27, 90d supply, fill #0

## 2021-01-16 MED ORDER — METFORMIN HCL ER 500 MG PO TB24
ORAL_TABLET | Freq: Every day | ORAL | 0 refills | Status: DC
  Filled 2021-01-16: qty 180, 90d supply, fill #0

## 2021-01-17 NOTE — Progress Notes (Signed)
Chief Complaint:   OBESITY Christina Rodriguez is here to discuss her progress with her obesity treatment plan along with follow-up of her obesity related diagnoses. Christina Rodriguez is on keeping a food journal and adhering to recommended goals of 1800-2000 calories and 130 grams of protein and states she is following her eating plan approximately 80% of the time. Christina Rodriguez states she is walking for 30 minutes 3-4 times per week.  Today's visit was #: 56 Starting weight: 260 lbs Starting date: 12/26/2016 Today's weight: 262 lbs Today's date: 01/16/2021 Total lbs lost to date: 0 Total lbs lost since last in-office visit: 0  Interim History:  Christina Rodriguez recently experienced an exacerbation of seasonal allergies (ragweed) treated with a course of prednisone - 4 weeks ago. Then she experienced GI upset N/V/diarrhea for 2 weeks.   No denies any current acute symptoms.  Subjective:   1. Pre-diabetes On 11/13/2020, A1c was 5.7 (improved from 5.9 on 06/29/2020). She is on metformin 500 mg - 2 tablets with breakfast and Victoza 1.8 mg daily. She denies mass in neck, dysphagia, dyspepsia, or persistent hoarseness. Her father has T2D.  He was diagnosed in his 40s/50s.  Lab Results  Component Value Date   HGBA1C 5.7 (H) 11/13/2020   Lab Results  Component Value Date   INSULIN 23.8 11/13/2020   INSULIN 17.1 06/29/2020   INSULIN 21.2 01/24/2020   INSULIN 30.0 (H) 08/26/2019   INSULIN 27.1 (H) 06/11/2019   2. At risk for constipation Christina Rodriguez is at increased risk for constipation due to GLP-1 for prediabetes and obesity.  Assessment/Plan:   1. Pre-diabetes Refill metformin 500 mg - 2 tablets with breakfast. Refill Victoza 1.8 mg once daily.  - Refill metFORMIN (GLUCOPHAGE-XR) 500 MG 24 hr tablet; TAKE 2 TABLETS BY MOUTH DAILY WITH BREAKFAST  Dispense: 180 tablet; Refill: 0 - Refill liraglutide (VICTOZA) 18 MG/3ML SOPN; INJECT 1.8 MG INTO THE SKIN DAILY.  Dispense: 27 mL; Refill: 0  2. At risk for  constipation Christina Rodriguez was given approximately 15 minutes of counseling today regarding prevention of constipation. She was encouraged to increase water and fiber intake.    3. Obesity with current BMI 39.8  Christina Rodriguez is currently in the action stage of change. As such, her goal is to continue with weight loss efforts. She has agreed to keeping a food journal and adhering to recommended goals of 1800-2000 calories and 130 grams of protein.   Exercise goals:  As is.  Behavioral modification strategies: increasing lean protein intake, decreasing simple carbohydrates, meal planning and cooking strategies, keeping healthy foods in the home, and planning for success.  Handout:  High Protein/Low Calorie Food List; Protein Content of Foods.  Christina Rodriguez has agreed to follow-up with our clinic in 4 weeks. She was informed of the importance of frequent follow-up visits to maximize her success with intensive lifestyle modifications for her multiple health conditions.   Objective:   Blood pressure 109/72, pulse 86, temperature 98.2 F (36.8 C), height 5\' 8"  (1.727 m), weight 262 lb (118.8 kg), SpO2 98 %. Body mass index is 39.84 kg/m.  General: Cooperative, alert, well developed, in no acute distress. HEENT: Conjunctivae and lids unremarkable. Cardiovascular: Regular rhythm.  Lungs: Normal work of breathing. Neurologic: No focal deficits.   Lab Results  Component Value Date   CREATININE 0.77 11/13/2020   BUN 12 11/13/2020   NA 140 11/13/2020   K 4.1 11/13/2020   CL 101 11/13/2020   CO2 22 11/13/2020   Lab Results  Component  Value Date   ALT 15 11/13/2020   AST 16 11/13/2020   ALKPHOS 89 11/13/2020   BILITOT 0.4 11/13/2020   Lab Results  Component Value Date   HGBA1C 5.7 (H) 11/13/2020   HGBA1C 5.9 (H) 06/29/2020   HGBA1C 5.9 (H) 01/24/2020   HGBA1C 5.6 08/26/2019   HGBA1C 5.7 (H) 06/11/2019   Lab Results  Component Value Date   INSULIN 23.8 11/13/2020   INSULIN 17.1 06/29/2020    INSULIN 21.2 01/24/2020   INSULIN 30.0 (H) 08/26/2019   INSULIN 27.1 (H) 06/11/2019   Lab Results  Component Value Date   TSH 1.800 04/14/2020   Lab Results  Component Value Date   CHOL 169 11/13/2020   HDL 33 (L) 11/13/2020   LDLCALC 118 (H) 11/13/2020   TRIG 99 11/13/2020   CHOLHDL 5.4 (H) 06/29/2020   Lab Results  Component Value Date   VD25OH 34.9 11/13/2020   VD25OH 22.5 (L) 06/29/2020   VD25OH 29.8 (L) 11/16/2019   Lab Results  Component Value Date   WBC 12.2 (H) 04/14/2020   HGB 11.1 04/14/2020   HCT 34.8 04/14/2020   MCV 84 04/14/2020   PLT 413 04/14/2020   Lab Results  Component Value Date   FERRITIN 22 04/14/2020   Attestation Statements:   Reviewed by clinician on day of visit: allergies, medications, problem list, medical history, surgical history, family history, social history, and previous encounter notes.  I, Insurance claims handler, CMA, am acting as transcriptionist for Helane Rima, DO  I have reviewed the above documentation for accuracy and completeness, and I agree with the above. -  Cynda Soule d. Brennyn Haisley, NP-C

## 2021-01-29 ENCOUNTER — Encounter: Payer: Self-pay | Admitting: Nurse Practitioner

## 2021-01-29 ENCOUNTER — Other Ambulatory Visit: Payer: Self-pay | Admitting: Nurse Practitioner

## 2021-01-29 MED ORDER — TRIAMCINOLONE ACETONIDE 0.1 % MT PSTE: PASTE | OROMUCOSAL | 0 refills | Status: DC

## 2021-02-21 ENCOUNTER — Encounter (INDEPENDENT_AMBULATORY_CARE_PROVIDER_SITE_OTHER): Payer: Self-pay | Admitting: Adult Health

## 2021-02-21 ENCOUNTER — Ambulatory Visit (INDEPENDENT_AMBULATORY_CARE_PROVIDER_SITE_OTHER): Payer: 59 | Admitting: Adult Health

## 2021-02-21 ENCOUNTER — Other Ambulatory Visit: Payer: Self-pay

## 2021-02-21 VITALS — BP 116/76 | HR 89 | Temp 98.6°F | Ht 68.0 in | Wt 262.0 lb

## 2021-02-21 DIAGNOSIS — R7303 Prediabetes: Secondary | ICD-10-CM | POA: Diagnosis not present

## 2021-02-21 DIAGNOSIS — Z6841 Body Mass Index (BMI) 40.0 and over, adult: Secondary | ICD-10-CM | POA: Diagnosis not present

## 2021-02-21 DIAGNOSIS — Z9189 Other specified personal risk factors, not elsewhere classified: Secondary | ICD-10-CM

## 2021-02-21 MED ORDER — METFORMIN HCL ER 500 MG PO TB24: ORAL_TABLET | ORAL | 0 refills | Status: DC

## 2021-02-21 NOTE — Progress Notes (Signed)
Chief Complaint:   OBESITY Christina Rodriguez is here to discuss her progress with her obesity treatment plan along with follow-up of her obesity related diagnoses. Christina Rodriguez is on keeping a food journal and adhering to recommended goals of 1800-2000 calories and 130 grams of protein and states she is following her eating plan approximately 90% of the time. Christina Rodriguez states she is walking for 30-40 minutes 3-4 times per week.  Today's visit was #: 57 Starting weight: 260 lbs Starting date: 12/26/2016 Today's weight: 262 lbs Today's date: 02/21/2021 Total lbs lost to date: 0 Total lbs lost since last in-office visit: 0  Interim History:  Previously on 1500 calories/90 grams protein.   IC RMR on 11/13/2020 was 2477.   Plan adjusted to 1800-2000 calories and 130 grams of protein. Christina Rodriguez is tracking intake ~90% of the time.   When tracking she will consume, on average, 2000 calories and 100 grams of protein per day.   She feels that her hunger level is increased at lunch.  Of note:  On 12/26/2016, weight 260 pounds, BMI 40.7.  Subjective:   1. Pre-diabetes On 11/13/2020, BG 87, A1c 5.7, insulin 23.8. She is on metformin XR 500 mg 2 tablets with breakfast and Victoza 1.8 mg daily.   She denies mass in neck, dysphagia, dyspepsia, or persistent hoarseness. On 11/13/2020, CMP showed GFR of 97.   2. At risk for diarrhea Christina Rodriguez is at higher risk of diarrhea due to increasing metformin XR 500 mg from 2 tablets to 3 tablets.   Assessment/Plan:   1. Pre-diabetes Increase metformin XR to 500 mg 3 tablets at breakfast.  No print- she has plenty of Rx at home.  - Increase metFORMIN (GLUCOPHAGE-XR) 500 MG 24 hr tablet; Take 3 tablets with breakfast daily  Dispense: 180 tablet; Refill: 0  2. At risk for diarrhea Christina Rodriguez was given approximately 15 minutes of diarrhea prevention counseling today. She is 45 y.o. female and has risk factors for diarrhea including medications and changes in diet. We discussed  intensive lifestyle modifications today with an emphasis on specific weight loss instructions including dietary strategies.   Repetitive spaced learning was employed today to elicit superior memory formation and behavioral change.   3. Obesity with current BMI 39.8  Christina Rodriguez is currently in the action stage of change. As such, her goal is to continue with weight loss efforts. She has agreed to keeping a food journal and adhering to recommended goals of 1800-2000 calories and 130 grams of protein.   Check fasting labs at next office visit.  Exercise goals:  As is.  Increase intensity of walking.  Behavioral modification strategies: increasing lean protein intake, decreasing simple carbohydrates, meal planning and cooking strategies, keeping healthy foods in the home, and planning for success.  Christina Rodriguez has agreed to follow-up with our clinic in 4 weeks, fasting. She was informed of the importance of frequent follow-up visits to maximize her success with intensive lifestyle modifications for her multiple health conditions.   Objective:   Blood pressure 116/76, pulse 89, temperature 98.6 F (37 C), height 5\' 8"  (1.727 m), weight 262 lb (118.8 kg), SpO2 98 %. Body mass index is 39.84 kg/m.  General: Cooperative, alert, well developed, in no acute distress. HEENT: Conjunctivae and lids unremarkable. Cardiovascular: Regular rhythm.  Lungs: Normal work of breathing. Neurologic: No focal deficits.   Lab Results  Component Value Date   CREATININE 0.77 11/13/2020   BUN 12 11/13/2020   NA 140 11/13/2020   K 4.1  11/13/2020   CL 101 11/13/2020   CO2 22 11/13/2020   Lab Results  Component Value Date   ALT 15 11/13/2020   AST 16 11/13/2020   ALKPHOS 89 11/13/2020   BILITOT 0.4 11/13/2020   Lab Results  Component Value Date   HGBA1C 5.7 (H) 11/13/2020   HGBA1C 5.9 (H) 06/29/2020   HGBA1C 5.9 (H) 01/24/2020   HGBA1C 5.6 08/26/2019   HGBA1C 5.7 (H) 06/11/2019   Lab Results  Component  Value Date   INSULIN 23.8 11/13/2020   INSULIN 17.1 06/29/2020   INSULIN 21.2 01/24/2020   INSULIN 30.0 (H) 08/26/2019   INSULIN 27.1 (H) 06/11/2019   Lab Results  Component Value Date   TSH 1.800 04/14/2020   Lab Results  Component Value Date   CHOL 169 11/13/2020   HDL 33 (L) 11/13/2020   LDLCALC 118 (H) 11/13/2020   TRIG 99 11/13/2020   CHOLHDL 5.4 (H) 06/29/2020   Lab Results  Component Value Date   VD25OH 34.9 11/13/2020   VD25OH 22.5 (L) 06/29/2020   VD25OH 29.8 (L) 11/16/2019   Lab Results  Component Value Date   WBC 12.2 (H) 04/14/2020   HGB 11.1 04/14/2020   HCT 34.8 04/14/2020   MCV 84 04/14/2020   PLT 413 04/14/2020   Lab Results  Component Value Date   FERRITIN 22 04/14/2020   Attestation Statements:   Reviewed by clinician on day of visit: allergies, medications, problem list, medical history, surgical history, family history, social history, and previous encounter notes.  I, Insurance claims handler, CMA, am acting as Energy manager for William Hamburger, NP.  I have reviewed the above documentation for accuracy and completeness, and I agree with the above. -  [place] Current d. Kiona Blume, NP-C

## 2021-02-23 ENCOUNTER — Other Ambulatory Visit: Payer: Self-pay | Admitting: Obstetrics and Gynecology

## 2021-02-23 DIAGNOSIS — Z9189 Other specified personal risk factors, not elsewhere classified: Secondary | ICD-10-CM

## 2021-02-26 ENCOUNTER — Encounter: Payer: 59 | Admitting: Gastroenterology

## 2021-02-28 ENCOUNTER — Other Ambulatory Visit (HOSPITAL_COMMUNITY): Payer: Self-pay

## 2021-02-28 ENCOUNTER — Ambulatory Visit (AMBULATORY_SURGERY_CENTER): Payer: 59 | Admitting: *Deleted

## 2021-02-28 ENCOUNTER — Encounter: Payer: Self-pay | Admitting: Gastroenterology

## 2021-02-28 ENCOUNTER — Other Ambulatory Visit: Payer: Self-pay

## 2021-02-28 VITALS — Ht 68.0 in | Wt 250.0 lb

## 2021-02-28 DIAGNOSIS — Z1211 Encounter for screening for malignant neoplasm of colon: Secondary | ICD-10-CM

## 2021-02-28 MED ORDER — NA SULFATE-K SULFATE-MG SULF 17.5-3.13-1.6 GM/177ML PO SOLN
1.0000 | Freq: Once | ORAL | 0 refills | Status: AC
  Filled 2021-02-28: qty 354, 1d supply, fill #0

## 2021-02-28 NOTE — Progress Notes (Signed)
Virtual pre visit completed in person.  Instructions forwarded through MyChart.    No egg or soy allergy known to patient  No issues known to pt with past sedation with any surgeries or procedures Patient denies ever being told they had issues or difficulty with intubation  No FH of Malignant Hyperthermia Pt is not on diet pills Pt is not on  home 02  Pt is not on blood thinners  Pt denies issues with constipation  No A fib or A flutter  Pt is fully vaccinated  for Covid   Due to the COVID-19 pandemic we are asking patients to follow certain guidelines in [facility] and the [facility]   Pt aware of COVID protocols and [facility] guidelines

## 2021-03-12 ENCOUNTER — Encounter: Payer: Self-pay | Admitting: Gastroenterology

## 2021-03-12 ENCOUNTER — Ambulatory Visit (AMBULATORY_SURGERY_CENTER): Payer: 59 | Admitting: Gastroenterology

## 2021-03-12 VITALS — BP 118/72 | HR 66 | Temp 97.1°F | Resp 14 | Ht 68.0 in | Wt 250.0 lb

## 2021-03-12 DIAGNOSIS — Z1211 Encounter for screening for malignant neoplasm of colon: Secondary | ICD-10-CM

## 2021-03-12 MED ORDER — SODIUM CHLORIDE 0.9 % IV SOLN: 500.0000 mL | Freq: Once | INTRAVENOUS | Status: DC

## 2021-03-12 NOTE — Progress Notes (Signed)
[facility] History and Physical   Primary Care Physician:  Babs Sciara, MD   Reason for Procedure:  Colorectal cancer screening  Plan:    Screening colonoscopy with possible interventions as needed     HPI: Christina Rodriguez is a very pleasant 45 y.o. female here for screening colonoscopy. Denies any nausea, vomiting, abdominal pain, melena or bright red blood per rectum  The risks and benefits as well as alternatives of endoscopic procedure(s) have been discussed and reviewed. All questions answered. The patient agrees to proceed.    Past Medical History:  Diagnosis Date   Adenomyosis    Anemia    HIstory of    Asthma    albuterol inhaler prn-recent uri-using more freq   Diabetes mellitus without complication (HCC)    Dysrhythmia    history of svt-controlled on cardizem-diagnosed in 1999-   Gall bladder disease    GERD (gastroesophageal reflux disease)    uses protonix   Headache    migraines    History of polycystic ovarian disease    Infertility associated with anovulation    Metabolic syndrome    Multiple food allergies    shrimp   Osteoarthritis    Palpitations    PCOS (polycystic ovarian syndrome)    PONV (postoperative nausea and vomiting)    Prediabetes    Recurrent upper respiratory infection (URI)    resolving   SVT (supraventricular tachycardia) (HCC)    History of    Vitamin D deficiency    VSD (ventricular septal defect)    history of -no problems    Past Surgical History:  Procedure Laterality Date   CESAREAN SECTION  2010   CESAREAN SECTION  11/16/2010   Procedure: CESAREAN SECTION;  Surgeon: Almon Hercules;  Location: [facility];  Service: Gynecology;  Laterality: N/A;  Repeat    CHOLECYSTECTOMY  2003   DILATION AND CURETTAGE OF UTERUS     HYSTEROSCOPY WITH D & C N/A 12/06/2014   Procedure: DILATATION AND CURETTAGE /HYSTEROSCOPY;  Surgeon: Waynard Reeds, MD;  Location: [facility];  Service: Gynecology;  Laterality: N/A;   uterine  polyps      Prior to Admission medications   Medication Sig Start Date End Date Taking? Authorizing Provider  Continuous Blood Gluc Receiver (FREESTYLE LIBRE READER) DEVI Use reader as directed with sensors 10/21/19  Yes Campbell Riches, NP  Continuous Blood Gluc Sensor (FREESTYLE LIBRE 14 DAY SENSOR) MISC Apply as directed every 14 days 10/21/19  Yes Campbell Riches, NP  cyanocobalamin 500 MCG tablet Take 500 mcg by mouth daily.   Yes [provider]  diltiazem (CARDIZEM CD) 240 MG 24 hr capsule TAKE 1 CAPSULE BY MOUTH ONCE A DAY 07/04/20 07/04/21 Yes Campbell Riches, NP  fexofenadine (ALLEGRA) 180 MG tablet Take by mouth.   Yes [provider]  Fluticasone-Salmeterol (ADVAIR DISKUS) 250-50 MCG/DOSE AEPB Inhale 1 puff into the lungs 2 (two) times daily. 10/28/18  Yes Campbell Riches, NP  furosemide (LASIX) 80 MG tablet Take 1 tablet (80 mg total) by mouth daily. 06/30/20  Yes Campbell Riches, NP  ibuprofen (ADVIL,MOTRIN) 200 MG tablet Take 400 mg by mouth every 6 (six) hours as needed for headache, mild pain or moderate pain.   Yes [provider]  Insulin Pen Needle (PEN NEEDLES) 32G X 5 MM MISC Inject 1.8 mg into the skin daily. Use with Victoza to inject into skin 03/10/19  Yes Quillian Quince D, MD  levonorgestrel (MIRENA, [phone]  MG,) 20 MCG/DAY IUD Mirena 20 mcg/24 hours (8 yrs) 52 mg intrauterine device  Take 1 device by intrauterine route.   Yes [provider]  liraglutide (VICTOZA) 18 MG/3ML SOPN INJECT 1.8 MG INTO THE SKIN DAILY. 01/16/21 01/16/22 Yes Danford, Orpha Bur D, NP  metFORMIN (GLUCOPHAGE-XR) 500 MG 24 hr tablet Take 3 tablets with breakfast daily 02/21/21  Yes Danford, Orpha Bur D, NP  Multiple Vitamins-Minerals (MULTIVITAMIN ADULT PO) Take 1 tablet by mouth daily.   Yes [provider]  ondansetron (ZOFRAN-ODT) 8 MG disintegrating tablet Take 1 tablet (8 mg total) by mouth every 8 (eight) hours as needed for nausea or vomiting. 09/28/19  Yes  Campbell Riches, NP  pantoprazole (PROTONIX) 40 MG tablet Take 1 tablet (40 mg total) by mouth daily. 06/05/19  Yes Campbell Riches, NP  potassium chloride (KLOR-CON) 10 MEQ tablet Take 2 tablets (20 mEq total) by mouth daily. Take 2 tabs po qd 06/30/20  Yes Hoskins, [name] C, NP  Vitamin D, Ergocalciferol, (DRISDOL) 1.25 MG (50000 UNIT) CAPS capsule TAKE 1 CAPSULE BY MOUTH EVERY 3 DAYS. 12/12/20 12/12/21 Yes Danford, Jinny Blossom, NP  albuterol (VENTOLIN HFA) 108 (90 Base) MCG/ACT inhaler Inhale 2 puffs into the lungs every 4 (four) hours as needed for wheezing or shortness of breath. 10/28/18   Campbell Riches, NP  EPINEPHrine 0.3 mg/0.3 mL IJ SOAJ injection Inject 0.3 mg into the muscle once. Reported on 05/03/2015    [provider]  rizatriptan (MAXALT-MLT) 10 MG disintegrating tablet Take 1 tablet (10 mg total) by mouth as needed for migraine. May repeat in 2 hours if needed; max 2 per 24 hours 09/06/13   Campbell Riches, NP    Current Outpatient Medications  Medication Sig Dispense Refill   Continuous Blood Gluc Receiver (FREESTYLE LIBRE READER) DEVI Use reader as directed with sensors 1 each 0   Continuous Blood Gluc Sensor (FREESTYLE LIBRE 14 DAY SENSOR) MISC Apply as directed every 14 days 2 each 11   cyanocobalamin 500 MCG tablet Take 500 mcg by mouth daily.     diltiazem (CARDIZEM CD) 240 MG 24 hr capsule TAKE 1 CAPSULE BY MOUTH ONCE A DAY 90 capsule 0   fexofenadine (ALLEGRA) 180 MG tablet Take by mouth.     Fluticasone-Salmeterol (ADVAIR DISKUS) 250-50 MCG/DOSE AEPB Inhale 1 puff into the lungs 2 (two) times daily. 60 each 5   furosemide (LASIX) 80 MG tablet Take 1 tablet (80 mg total) by mouth daily. 30 tablet 3   ibuprofen (ADVIL,MOTRIN) 200 MG tablet Take 400 mg by mouth every 6 (six) hours as needed for headache, mild pain or moderate pain.     Insulin Pen Needle (PEN NEEDLES) 32G X 5 MM MISC Inject 1.8 mg into the skin daily. Use with Victoza to inject into skin 100 each  0   levonorgestrel (MIRENA, 52 MG,) 20 MCG/DAY IUD Mirena 20 mcg/24 hours (8 yrs) 52 mg intrauterine device  Take 1 device by intrauterine route.     liraglutide (VICTOZA) 18 MG/3ML SOPN INJECT 1.8 MG INTO THE SKIN DAILY. 27 mL 0   metFORMIN (GLUCOPHAGE-XR) 500 MG 24 hr tablet Take 3 tablets with breakfast daily 180 tablet 0   Multiple Vitamins-Minerals (MULTIVITAMIN ADULT PO) Take 1 tablet by mouth daily.     ondansetron (ZOFRAN-ODT) 8 MG disintegrating tablet Take 1 tablet (8 mg total) by mouth every 8 (eight) hours as needed for nausea or vomiting. 20 tablet 0   pantoprazole (PROTONIX) 40 MG tablet  Take 1 tablet (40 mg total) by mouth daily. 90 tablet 1   potassium chloride (KLOR-CON) 10 MEQ tablet Take 2 tablets (20 mEq total) by mouth daily. Take 2 tabs po qd 60 tablet 3   Vitamin D, Ergocalciferol, (DRISDOL) 1.25 MG (50000 UNIT) CAPS capsule TAKE 1 CAPSULE BY MOUTH EVERY 3 DAYS. 8 capsule 0   albuterol (VENTOLIN HFA) 108 (90 Base) MCG/ACT inhaler Inhale 2 puffs into the lungs every 4 (four) hours as needed for wheezing or shortness of breath. 18 g 5   EPINEPHrine 0.3 mg/0.3 mL IJ SOAJ injection Inject 0.3 mg into the muscle once. Reported on 05/03/2015     rizatriptan (MAXALT-MLT) 10 MG disintegrating tablet Take 1 tablet (10 mg total) by mouth as needed for migraine. May repeat in 2 hours if needed; max 2 per 24 hours 27 tablet 1   Current Facility-Administered Medications  Medication Dose Route Frequency Provider Last Rate Last Admin   0.9 %  sodium chloride infusion  500 mL Intravenous Once Napoleon Form, MD        Allergies as of 03/12/2021 - Review Complete 03/12/2021  Allergen Reaction Noted   Beta adrenergic blockers Shortness Of Breath 11/08/2010   Shrimp [shellfish allergy] Hives 11/09/2010   Soybeans Hives 11/09/2010    Family History  Problem Relation Age of Onset   Diabetes Father    Hypertension Father    Hyperlipidemia Father    Sleep apnea Father    Obesity  Father    Colon cancer Brother    Stomach cancer Maternal Grandmother    Colon polyps Neg Hx    Esophageal cancer Neg Hx    Rectal cancer Neg Hx     Social History   Socioeconomic History   Marital status: Married    Spouse name: Apolinar Junes   Number of children: 2   Years of education: Not on file   Highest education level: Not on file  Occupational History   Occupation: [name], Psychologist, forensic: [employer]  Tobacco Use   Smoking status: Never   Smokeless tobacco: Never  Substance and Sexual Activity   Alcohol use: No   Drug use: No   Sexual activity: Yes    Birth control/protection: Pill  Other Topics Concern   Not on file  Social History Narrative   Not on file   Social Determinants of Health   Financial Resource Strain: Not on file  Food Insecurity: Not on file  Transportation Needs: Not on file  Physical Activity: Not on file  Stress: Not on file  Social Connections: Not on file  Intimate Partner Violence: Not on file    Review of Systems:  All other review of systems negative except as mentioned in the HPI.  Physical Exam: Vital signs in last 24 hours: BP 124/70 (Patient Position: Sitting)   Pulse 73   Temp (!) 97.1 F (36.2 C)   Ht 5\' 8"  (1.727 m)   Wt 250 lb (113.4 kg)   SpO2 100%   BMI 38.01 kg/m     General:   Alert, NAD Lungs:  Clear .   Heart:  Regular rate and rhythm Abdomen:  Soft, nontender and nondistended. Neuro/Psych:  Alert and cooperative. Normal mood and affect. A and O x 3  Reviewed labs, radiology imaging, old records and pertinent past GI work up  Patient is appropriate for planned procedure(s) and anesthesia in an ambulatory setting   K. , MD [phone]

## 2021-03-12 NOTE — Op Note (Signed)
[facility] Patient Name: Christina Rodriguez Procedure Date: 03/12/2021 10:28 AM MRN: [id number] Endoscopist: Mauri Pole , MD Age: 45 Referring MD:  Date of Birth: 1975-05-09 Gender: Female Account #: [id number] Procedure:                Colonoscopy Indications:              Screening for colorectal malignant neoplasm Medicines:                Monitored Anesthesia Care Procedure:                Pre-Anesthesia Assessment:                           - Prior to the procedure, a History and Physical                            was performed, and patient medications and                            allergies were reviewed. The patient's tolerance of                            previous anesthesia was also reviewed. The risks                            and benefits of the procedure and the sedation                            options and risks were discussed with the patient.                            All questions were answered, and informed consent                            was obtained. Prior Anticoagulants: The patient has                            taken no previous anticoagulant or antiplatelet                            agents. ASA Grade Assessment: II - A patient with                            mild systemic disease. After reviewing the risks                            and benefits, the patient was deemed in                            satisfactory condition to undergo the procedure.                           After obtaining informed consent, the colonoscope  was passed under direct vision. Throughout the                            procedure, the patient's blood pressure, pulse, and                            oxygen saturations were monitored continuously. The                            Olympus [id number]-[id number]) Colonoscope was                            introduced through the anus and advanced to the the                            cecum,  identified by appendiceal orifice and                            ileocecal valve. The colonoscopy was performed                            without difficulty. The patient tolerated the                            procedure well. The quality of the bowel                            preparation was adequate. The ileocecal valve,                            appendiceal orifice, and rectum were photographed. Scope In: 10:40:43 AM Scope Out: 10:55:40 AM Scope Withdrawal Time: 0 hours 9 minutes 19 seconds  Total Procedure Duration: 0 hours 14 minutes 57 seconds  Findings:                 The perianal and digital rectal examinations were                            normal.                           A small amount of stool was found in the entire                            colon, making visualization difficult. Lavage of                            the area was performed, resulting in clearance with                            adequate visualization.                           Non-bleeding external and internal hemorrhoids were  found during retroflexion. The hemorrhoids were                            small.                           The exam was otherwise without abnormality. Complications:            No immediate complications. Estimated Blood Loss:     Estimated blood loss: none. Impression:               - Small amount of stool in the entire examined                            colon. Lavage of the area was performed, resulting                            in clearance with adequate visualization                           - Non-bleeding external and internal hemorrhoids.                           - The examination was otherwise normal.                           - No specimens collected. Recommendation:           - Patient has a contact number available for                            emergencies. The signs and symptoms of potential                            delayed  complications were discussed with the                            patient. Return to normal activities tomorrow.                            Written discharge instructions were provided to the                            patient.                           - Resume previous diet.                           - Continue present medications.                           - Repeat colonoscopy in 10 years for screening                            purposes. Napoleon Form, MD 03/12/2021 11:05:43 AM This report has been signed electronically.

## 2021-03-12 NOTE — Progress Notes (Signed)
Sedate, gd SR, tolerated procedure well, VSS, report to RN 

## 2021-03-12 NOTE — Patient Instructions (Signed)
YOU HAD AN ENDOSCOPIC PROCEDURE TODAY AT THE [facility]:   Refer to the procedure report that was given to you for any specific questions about what was found during the examination.  If the procedure report does not answer your questions, please call your gastroenterologist to clarify.  If you requested that your care partner not be given the details of your procedure findings, then the procedure report has been included in a sealed envelope for you to review at your convenience later. ° °YOU SHOULD EXPECT: Some feelings of bloating in the abdomen. Passage of more gas than usual.  Walking can help get rid of the air that was put into your GI tract during the procedure and reduce the bloating. If you had a lower endoscopy (such as a colonoscopy or flexible sigmoidoscopy) you may notice spotting of blood in your stool or on the toilet paper. If you underwent a bowel prep for your procedure, you may not have a normal bowel movement for a few days. ° °Please Note:  You might notice some irritation and congestion in your nose or some drainage.  This is from the oxygen used during your procedure.  There is no need for concern and it should clear up in a day or so. ° °SYMPTOMS TO REPORT IMMEDIATELY: ° °Following lower endoscopy (colonoscopy): ° Excessive amounts of blood in the stool ° Significant tenderness or worsening of abdominal pains ° Swelling of the abdomen that is new, acute ° Fever of 100°F or higher ° °For urgent or emergent issues, a gastroenterologist can be reached at any hour by calling [phone]. °Do not use MyChart messaging for urgent concerns.  ° ° °DIET:  We do recommend a small meal at first, but then you may proceed to your regular diet.  Drink plenty of fluids but you should avoid alcoholic beverages for 24 hours. ° °ACTIVITY:  You should plan to take it easy for the rest of today and you should NOT DRIVE or use heavy machinery until tomorrow (because of the sedation medicines  used during the test).   ° °FOLLOW UP: °Our staff will call the number listed on your records 48-72 hours following your procedure to check on you and address any questions or concerns that you may have regarding the information given to you following your procedure. If we do not reach you, we will leave a message.  We will attempt to reach you two times.  During this call, we will ask if you have developed any symptoms of COVID 19. If you develop any symptoms (ie: fever, flu-like symptoms, shortness of breath, cough etc.) before then, please call [phone].  If you test positive for Covid 19 in the 2 weeks post procedure, please call and report this information to us.   ° °There were no polyps seen today!  You will need another screening colonoscopy in 10 years, you will receive a letter at that time when you are due for the procedure.   Please call us at [phone] if you have a change in bowel habits, change in family history of colo-rectal cancer, rectal bleeding or other GI concern before that time.  ° °SIGNATURES/CONFIDENTIALITY: °You and/or your care partner have signed paperwork which will be entered into your electronic medical record.  These signatures attest to the fact that that the information above on your After Visit Summary has been reviewed and is understood.  Full responsibility of the confidentiality of this discharge information lies with you and/or   your care-partner.  °

## 2021-03-12 NOTE — Progress Notes (Signed)
Pt's states no medical or surgical changes since previsit or office visit.   Check-in-AM  V/S-DT 

## 2021-03-14 ENCOUNTER — Telehealth: Payer: Self-pay | Admitting: *Deleted

## 2021-03-14 NOTE — Telephone Encounter (Signed)
  Follow up Call-  Call back number 03/12/2021  Post procedure Call Back phone  # [phone]  Permission to leave phone message Yes  Some recent data might be hidden     Patient questions:  Do you have a fever, pain , or abdominal swelling? No. Pain Score  0 *  Have you tolerated food without any problems? Yes.    Have you been able to return to your normal activities? Yes.    Do you have any questions about your discharge instructions: Diet   No. Medications  No. Follow up visit  No.  Do you have questions or concerns about your Care? No.  Actions: * If pain score is 4 or above: No action needed, pain <4.  Have you developed a fever since your procedure? no  2.   Have you had an respiratory symptoms (SOB or cough) since your procedure? no  3.   Have you tested positive for COVID 19 since your procedure no  4.   Have you had any family members/close contacts diagnosed with the COVID 19 since your procedure?  no   If yes to any of these questions please route to Laverna Peace, RN and Karlton Lemon, RN

## 2021-04-02 ENCOUNTER — Other Ambulatory Visit: Payer: Self-pay | Admitting: Adult Health

## 2021-04-02 ENCOUNTER — Other Ambulatory Visit (HOSPITAL_COMMUNITY): Payer: Self-pay

## 2021-04-02 MED ORDER — FUROSEMIDE 80 MG PO TABS
80.0000 mg | ORAL_TABLET | Freq: Every day | ORAL | 2 refills | Status: DC
  Filled 2021-04-02: qty 90, 90d supply, fill #0
  Filled 2021-07-02: qty 90, 90d supply, fill #1
  Filled 2021-10-02: qty 90, 90d supply, fill #2

## 2021-04-05 ENCOUNTER — Ambulatory Visit (INDEPENDENT_AMBULATORY_CARE_PROVIDER_SITE_OTHER): Payer: 59 | Admitting: Adult Health

## 2021-04-13 ENCOUNTER — Ambulatory Visit (INDEPENDENT_AMBULATORY_CARE_PROVIDER_SITE_OTHER): Payer: 59 | Admitting: Nurse Practitioner

## 2021-04-13 ENCOUNTER — Other Ambulatory Visit (HOSPITAL_COMMUNITY): Payer: Self-pay

## 2021-04-13 ENCOUNTER — Other Ambulatory Visit: Payer: Self-pay

## 2021-04-13 ENCOUNTER — Other Ambulatory Visit: Payer: Self-pay | Admitting: Nurse Practitioner

## 2021-04-13 DIAGNOSIS — D500 Iron deficiency anemia secondary to blood loss (chronic): Secondary | ICD-10-CM

## 2021-04-13 DIAGNOSIS — L301 Dyshidrosis [pompholyx]: Secondary | ICD-10-CM | POA: Diagnosis not present

## 2021-04-13 DIAGNOSIS — J4521 Mild intermittent asthma with (acute) exacerbation: Secondary | ICD-10-CM | POA: Diagnosis not present

## 2021-04-13 DIAGNOSIS — R5383 Other fatigue: Secondary | ICD-10-CM

## 2021-04-13 DIAGNOSIS — R7989 Other specified abnormal findings of blood chemistry: Secondary | ICD-10-CM

## 2021-04-13 DIAGNOSIS — E7849 Other hyperlipidemia: Secondary | ICD-10-CM

## 2021-04-13 DIAGNOSIS — I10 Essential (primary) hypertension: Secondary | ICD-10-CM

## 2021-04-13 DIAGNOSIS — E119 Type 2 diabetes mellitus without complications: Secondary | ICD-10-CM | POA: Diagnosis not present

## 2021-04-13 MED ORDER — TIRZEPATIDE 5 MG/0.5ML ~~LOC~~ SOAJ
5.0000 mg | SUBCUTANEOUS | 2 refills | Status: DC
  Filled 2021-04-13: qty 2, 28d supply, fill #0

## 2021-04-13 MED ORDER — POTASSIUM CHLORIDE ER 10 MEQ PO TBCR
20.0000 meq | EXTENDED_RELEASE_TABLET | Freq: Every day | ORAL | 1 refills | Status: DC
  Filled 2021-04-13: qty 180, 90d supply, fill #0

## 2021-04-13 MED ORDER — TIRZEPATIDE 10 MG/0.5ML ~~LOC~~ SOAJ
10.0000 mg | SUBCUTANEOUS | 2 refills | Status: DC
  Filled 2021-04-13: qty 2, 28d supply, fill #0

## 2021-04-13 MED ORDER — BUDESONIDE-FORMOTEROL FUMARATE 80-4.5 MCG/ACT IN AERO
2.0000 | INHALATION_SPRAY | Freq: Two times a day (BID) | RESPIRATORY_TRACT | 2 refills | Status: DC
  Filled 2021-04-13: qty 10.2, 30d supply, fill #0

## 2021-04-13 MED ORDER — TRIAMCINOLONE ACETONIDE 0.1 % EX CREA
1.0000 "application " | TOPICAL_CREAM | Freq: Two times a day (BID) | CUTANEOUS | 0 refills | Status: AC
  Filled 2021-04-13: qty 30, 14d supply, fill #0

## 2021-04-14 ENCOUNTER — Encounter: Payer: Self-pay | Admitting: Nurse Practitioner

## 2021-04-14 NOTE — Progress Notes (Signed)
Subjective:    Patient ID: Christina Rodriguez, female    DOB: [birth year], 45 y.o.   MRN: [id number]  HPI Presents for routine follow-up.  Has been going to [facility] clinic but has decided to stop going there because has not made any progress and nothing provided there that she cannot get here.  Is due for her routine lab work.  Has an eye exam scheduled for April 25, 2021.  Has been on Victoza long-term.  Minimal improvement in her weight.  Insurance would not cover the higher doses for weight loss.  Adherent to medication regimen.  Has not been on Advair in a long time, states it is also expensive through her insurance.  Has been on long-term vitamin D therapy due to repeated vitamin D deficiency.  Gets regular female physicals with her gynecologist. Also complaints of tiny areas of rash on the extremities.  Small areas that are recurrent in different places mildly pruritic with slight burning sensation.  Starts off as a tiny cluster of small bumps with scaling.  Can last for weeks but eventually resolves on its own.  Has been going on long-term.     Review of Systems  HENT:  Negative for sore throat and trouble swallowing.   Respiratory:  Positive for cough, shortness of breath and wheezing. Negative for chest tightness.        Had a recent cold, still experiencing some cough, mild shortness of breath with wheezing at times.  Using her albuterol inhaler.  Likes to avoid oral steroids due to the increase in her blood sugars.  Cardiovascular:  Negative for chest pain.      Objective:   Physical Exam NAD.  Alert, oriented.  Thyroid nontender to palpation, no mass or goiter noted.  Lungs scattered faint expiratory wheezes, no tachypnea.  Normal color.  Otherwise lungs clear.  Heart regular rate rhythm.  Patient defers diabetic foot exam today. 2 tiny pink patches noted 1 on each forearm.  1 is extremely tiny with a couple of pink tiny papules.  The other has several of  these slightly raised pink in color on the right forearm.      Assessment & Plan:   Problem List Items Addressed This Visit       Cardiovascular and Mediastinum   Hypertension - Primary   Relevant Orders   Lipid panel   Comprehensive Metabolic Panel (CMET)     Respiratory   Asthma with acute exacerbation   Relevant Medications   budesonide-formoterol (SYMBICORT) 80-4.5 MCG/ACT inhaler     Endocrine   Diabetes mellitus (HCC)   Relevant Medications   tirzepatide (MOUNJARO) 10 MG/0.5ML Pen   Other Relevant Orders   Hemoglobin A1c   Microalbumin, urine   Lipid panel   Comprehensive Metabolic Panel (CMET)     Musculoskeletal and Integument   Dyshidrotic eczema     Other   Anemia   Relevant Orders   CBC with Differential/Platelet   Elevated LFTs   Relevant Orders   Comprehensive Metabolic Panel (CMET)   Morbid obesity (HCC)   Relevant Medications   tirzepatide (MOUNJARO) 10 MG/0.5ML Pen   Other hyperlipidemia   Relevant Orders   Lipid panel   Other Visit Diagnoses     Fatigue, unspecified type       Relevant Orders   TSH   Comprehensive Metabolic Panel (CMET)      Meds ordered this encounter  Medications   budesonide-formoterol (SYMBICORT) 80-4.5 MCG/ACT inhaler  Sig: Inhale 2 puffs into the lungs 2 times daily.    Dispense:  10.2 g    Refill:  2    Order Specific Question:   Supervising Provider    Answer:   Lilyan Punt A [[id number]]   DISCONTD: tirzepatide Greggory Keen) 5 MG/0.5ML Pen    Sig: Inject 5 mg into the skin once a week.    Dispense:  2 mL    Refill:  2    Patient has a coupon for one free month and $25 discount per month; Diagnosis: E11.9    Order Specific Question:   Supervising Provider    Answer:   Lilyan Punt A [[id number]]   potassium chloride (KLOR-CON) 10 MEQ tablet    Sig: Take 2 tablets by mouth daily.    Dispense:  180 tablet    Refill:  1    Order Specific Question:   Supervising Provider    Answer:   Lilyan Punt A [[id number]]    triamcinolone cream (KENALOG) 0.1 %    Sig: Apply to affected area 2 times a day as needed for rash, use up to 2 weeks in one area    Dispense:  30 g    Refill:  0    Order Specific Question:   Supervising Provider    Answer:   Lilyan Punt A [[id number]]   tirzepatide (MOUNJARO) 10 MG/0.5ML Pen    Sig: Inject 10 mg into the skin once a week.    Dispense:  2 mL    Refill:  2    Order Specific Question:   Supervising Provider    Answer:   Lilyan Punt A [[id number]]   Labs pending. Trial of triamcinolone as directed to rash as needed.  Contact office if persist. Stop Victoza.  Switch to [facility].  According to pharmacist at [facility] the only dose available right now is 10 mg.  We will start with this dose since patient has been on Victoza long-term on 1.8 mg daily. Start generic Symbicort as directed.  Our goal is for her to decrease and eventually stop the use of her albuterol.  Patient to contact office next week if no improvement in her wheezing. Discussed importance of healthy diet and regular activity and weight loss. Return in about 3 months (around 07/12/2021) for Recheck.

## 2021-04-18 ENCOUNTER — Other Ambulatory Visit: Payer: 59

## 2021-04-26 DIAGNOSIS — H5203 Hypermetropia, bilateral: Secondary | ICD-10-CM | POA: Diagnosis not present

## 2021-05-04 ENCOUNTER — Other Ambulatory Visit: Payer: Self-pay | Admitting: Nurse Practitioner

## 2021-05-04 ENCOUNTER — Other Ambulatory Visit (HOSPITAL_COMMUNITY): Payer: Self-pay

## 2021-05-04 MED ORDER — TIRZEPATIDE 10 MG/0.5ML ~~LOC~~ SOAJ
10.0000 mg | SUBCUTANEOUS | 2 refills | Status: DC
  Filled 2021-05-04: qty 2, 28d supply, fill #0
  Filled 2021-06-06: qty 2, 28d supply, fill #1
  Filled 2021-07-02: qty 2, 28d supply, fill #2

## 2021-05-05 ENCOUNTER — Other Ambulatory Visit: Payer: 59

## 2021-05-26 ENCOUNTER — Other Ambulatory Visit: Payer: Self-pay

## 2021-05-26 ENCOUNTER — Ambulatory Visit
Admission: RE | Admit: 2021-05-26 | Discharge: 2021-05-26 | Disposition: A | Discharge status: Home / Self Care | Payer: 59 | Source: Ambulatory Visit | Attending: Obstetrics and Gynecology | Admitting: Obstetrics and Gynecology

## 2021-05-26 DIAGNOSIS — Z1239 Encounter for other screening for malignant neoplasm of breast: Secondary | ICD-10-CM | POA: Diagnosis not present

## 2021-05-26 DIAGNOSIS — Z9189 Other specified personal risk factors, not elsewhere classified: Secondary | ICD-10-CM

## 2021-05-26 MED ORDER — GADOBUTROL 1 MMOL/ML IV SOLN
10.0000 mL | Freq: Once | INTRAVENOUS | Status: AC | PRN
  Administered 2021-05-26: 10 mL via INTRAVENOUS

## 2021-05-31 ENCOUNTER — Ambulatory Visit (INDEPENDENT_AMBULATORY_CARE_PROVIDER_SITE_OTHER): Payer: 59 | Admitting: Adult Health

## 2021-06-06 ENCOUNTER — Other Ambulatory Visit (HOSPITAL_COMMUNITY): Payer: Self-pay

## 2021-06-19 ENCOUNTER — Ambulatory Visit (INDEPENDENT_AMBULATORY_CARE_PROVIDER_SITE_OTHER): Payer: 59 | Admitting: Adult Health

## 2021-07-02 ENCOUNTER — Other Ambulatory Visit (HOSPITAL_COMMUNITY): Payer: Self-pay

## 2021-07-02 ENCOUNTER — Other Ambulatory Visit (INDEPENDENT_AMBULATORY_CARE_PROVIDER_SITE_OTHER): Payer: Self-pay | Admitting: Adult Health

## 2021-07-02 DIAGNOSIS — R7303 Prediabetes: Secondary | ICD-10-CM

## 2021-07-05 ENCOUNTER — Other Ambulatory Visit (INDEPENDENT_AMBULATORY_CARE_PROVIDER_SITE_OTHER): Payer: Self-pay | Admitting: Nurse Practitioner

## 2021-07-05 ENCOUNTER — Other Ambulatory Visit (HOSPITAL_COMMUNITY): Payer: Self-pay

## 2021-07-05 DIAGNOSIS — R7303 Prediabetes: Secondary | ICD-10-CM

## 2021-07-06 ENCOUNTER — Other Ambulatory Visit (HOSPITAL_COMMUNITY): Payer: Self-pay

## 2021-07-06 MED ORDER — METFORMIN HCL ER 500 MG PO TB24
ORAL_TABLET | ORAL | 0 refills | Status: DC
  Filled 2021-07-06: qty 180, 60d supply, fill #0

## 2021-07-27 ENCOUNTER — Other Ambulatory Visit (HOSPITAL_COMMUNITY): Payer: Self-pay

## 2021-07-27 ENCOUNTER — Other Ambulatory Visit: Payer: Self-pay | Admitting: Nurse Practitioner

## 2021-07-27 MED ORDER — TIRZEPATIDE 12.5 MG/0.5ML ~~LOC~~ SOAJ
12.5000 mg | SUBCUTANEOUS | 0 refills | Status: DC
  Filled 2021-07-27: qty 6, 84d supply, fill #0

## 2021-07-27 MED ORDER — TIRZEPATIDE 10 MG/0.5ML ~~LOC~~ SOAJ
10.0000 mg | SUBCUTANEOUS | 2 refills | Status: DC
  Filled 2021-07-27: qty 4, 56d supply, fill #0

## 2021-09-28 ENCOUNTER — Encounter: Payer: Self-pay | Admitting: Nurse Practitioner

## 2021-09-28 ENCOUNTER — Other Ambulatory Visit (HOSPITAL_COMMUNITY): Payer: Self-pay

## 2021-09-28 ENCOUNTER — Ambulatory Visit: Payer: 59 | Admitting: Nurse Practitioner

## 2021-09-28 VITALS — BP 124/84 | HR 77 | Temp 97.7°F | Wt 244.0 lb

## 2021-09-28 DIAGNOSIS — E119 Type 2 diabetes mellitus without complications: Secondary | ICD-10-CM | POA: Diagnosis not present

## 2021-09-28 DIAGNOSIS — E7849 Other hyperlipidemia: Secondary | ICD-10-CM | POA: Diagnosis not present

## 2021-09-28 DIAGNOSIS — Z79899 Other long term (current) drug therapy: Secondary | ICD-10-CM | POA: Diagnosis not present

## 2021-09-28 DIAGNOSIS — R7303 Prediabetes: Secondary | ICD-10-CM

## 2021-09-28 DIAGNOSIS — I10 Essential (primary) hypertension: Secondary | ICD-10-CM | POA: Diagnosis not present

## 2021-09-28 DIAGNOSIS — R5383 Other fatigue: Secondary | ICD-10-CM | POA: Diagnosis not present

## 2021-09-28 MED ORDER — TIRZEPATIDE 12.5 MG/0.5ML ~~LOC~~ SOAJ
12.5000 mg | SUBCUTANEOUS | 0 refills | Status: DC
  Filled 2021-09-28: qty 6, 84d supply, fill #0

## 2021-09-28 MED ORDER — METFORMIN HCL ER 500 MG PO TB24
ORAL_TABLET | ORAL | 0 refills | Status: DC
  Filled 2021-09-28: qty 270, 90d supply, fill #0

## 2021-09-28 NOTE — Progress Notes (Unsigned)
Subjective:    Patient ID: Christina Rodriguez, female    DOB: 14-Mar-1976, 46 y.o.   MRN: [id number]  HPI Presents for recheck of her diabetes. Has been working on weight loss. Eating smaller portion sizes and walking for activity. Has been on Metformin for years. On Glipizide at first but experienced significant hypoglycemic episodes. Was also on Victoza maximum dose for years but has done much better on Mounjaro. Denies any adverse effects.  Eye exam done in December.  See GYN for physicals.     09/28/2021    4:25 PM  Depression screen PHQ 2/9  Decreased Interest 0  Down, Depressed, Hopeless 0  PHQ - 2 Score 0     Review of Systems  Respiratory:  Negative for chest tightness and shortness of breath.   Cardiovascular:  Negative for chest pain.  Gastrointestinal:  Negative for nausea and vomiting.      Objective:   Physical Exam NAD. Alert, oriented. Thyroid non tender to palpation, no mass or goiter noted. Lungs clear. Heart RRR.  Diabetic Foot Exam - Simple   Simple Foot Form Diabetic Foot exam was performed with the following findings: Yes 09/28/2021  4:28 PM  Visual Inspection No deformities, no ulcerations, no other skin breakdown bilaterally: Yes Sensation Testing Intact to touch and monofilament testing bilaterally: Yes Pulse Check See comments: Yes Comments DP pulses present bilaterally. Skin warm. Normal cap refill.     Today's Vitals   09/28/21 1610  BP: 124/84  Pulse: 77  Temp: 97.7 F (36.5 C)  SpO2: 99%  Weight: 244 lb (110.7 kg)   Body mass index is 37.1 kg/m. Initial weight according to patient was 267 lbs.       Assessment & Plan:   Problem List Items Addressed This Visit       Cardiovascular and Mediastinum   Hypertension   Relevant Orders   Comprehensive Metabolic Panel (CMET)   TSH   Lipid Profile   Urine Microalbumin w/creat. ratio   Hemoglobin A1c   CBC with Differential     Endocrine   Diabetes mellitus (HCC) - Primary    Relevant Medications   tirzepatide (MOUNJARO) 12.5 MG/0.5ML Pen   metFORMIN (GLUCOPHAGE-XR) 500 MG 24 hr tablet   Other Relevant Orders   Comprehensive Metabolic Panel (CMET)   TSH   Lipid Profile   Urine Microalbumin w/creat. ratio   Hemoglobin A1c   CBC with Differential     Other   Other fatigue   Relevant Orders   Comprehensive Metabolic Panel (CMET)   TSH   Lipid Profile   Urine Microalbumin w/creat. ratio   Hemoglobin A1c   CBC with Differential   Other hyperlipidemia   Relevant Orders   Comprehensive Metabolic Panel (CMET)   TSH   Lipid Profile   Urine Microalbumin w/creat. ratio   Hemoglobin A1c   CBC with Differential   Other Visit Diagnoses     High risk medication use       Relevant Orders   Comprehensive Metabolic Panel (CMET)   TSH   Lipid Profile   Urine Microalbumin w/creat. ratio   Hemoglobin A1c   CBC with Differential      Routine labs ordered.  Encouraged continued healthy lifestyle and weight loss efforts.  Meds ordered this encounter  Medications   tirzepatide (MOUNJARO) 12.5 MG/0.5ML Pen    Sig: Inject 12.5 mg into the skin once a week.    Dispense:  6 mL    Refill:  0  Order Specific Question:   Supervising Provider    Answer:   Lilyan Punt A [[id number]]   metFORMIN (GLUCOPHAGE-XR) 500 MG 24 hr tablet    Sig: Take 3 tablets by mouth with breakfast daily    Dispense:  270 tablet    Refill:  0    Order Specific Question:   Supervising Provider    Answer:   Babs Sciara [[id number]]   Return in about 3 months (around 12/29/2021).

## 2021-09-29 ENCOUNTER — Encounter: Payer: Self-pay | Admitting: Nurse Practitioner

## 2021-10-02 ENCOUNTER — Other Ambulatory Visit (HOSPITAL_COMMUNITY): Payer: Self-pay

## 2021-10-03 ENCOUNTER — Other Ambulatory Visit (HOSPITAL_COMMUNITY): Payer: Self-pay

## 2021-10-15 ENCOUNTER — Ambulatory Visit (HOSPITAL_BASED_OUTPATIENT_CLINIC_OR_DEPARTMENT_OTHER): Payer: 59 | Admitting: Cardiology

## 2021-10-25 DIAGNOSIS — Z01419 Encounter for gynecological examination (general) (routine) without abnormal findings: Secondary | ICD-10-CM | POA: Diagnosis not present

## 2021-10-25 DIAGNOSIS — Z124 Encounter for screening for malignant neoplasm of cervix: Secondary | ICD-10-CM | POA: Diagnosis not present

## 2021-10-25 DIAGNOSIS — Z1231 Encounter for screening mammogram for malignant neoplasm of breast: Secondary | ICD-10-CM | POA: Diagnosis not present

## 2021-10-25 LAB — HM PAP SMEAR: HM Pap smear: NORMAL

## 2021-11-29 ENCOUNTER — Ambulatory Visit (HOSPITAL_BASED_OUTPATIENT_CLINIC_OR_DEPARTMENT_OTHER): Payer: 59 | Admitting: Cardiology

## 2021-12-12 ENCOUNTER — Encounter (INDEPENDENT_AMBULATORY_CARE_PROVIDER_SITE_OTHER): Payer: Self-pay

## 2021-12-14 ENCOUNTER — Other Ambulatory Visit: Payer: Self-pay | Admitting: Nurse Practitioner

## 2021-12-14 ENCOUNTER — Other Ambulatory Visit (HOSPITAL_COMMUNITY): Payer: Self-pay

## 2021-12-14 DIAGNOSIS — E119 Type 2 diabetes mellitus without complications: Secondary | ICD-10-CM

## 2021-12-14 MED ORDER — FUROSEMIDE 80 MG PO TABS
80.0000 mg | ORAL_TABLET | Freq: Every day | ORAL | 0 refills | Status: DC
  Filled 2021-12-14: qty 90, 90d supply, fill #0

## 2021-12-14 MED ORDER — METFORMIN HCL ER 500 MG PO TB24
ORAL_TABLET | ORAL | 0 refills | Status: DC
  Filled 2021-12-14: qty 270, 90d supply, fill #0

## 2021-12-14 MED ORDER — TIRZEPATIDE 15 MG/0.5ML ~~LOC~~ SOAJ
15.0000 mg | SUBCUTANEOUS | 0 refills | Status: DC
  Filled 2021-12-14: qty 6, 84d supply, fill #0

## 2021-12-31 DIAGNOSIS — R5383 Other fatigue: Secondary | ICD-10-CM | POA: Diagnosis not present

## 2021-12-31 DIAGNOSIS — E119 Type 2 diabetes mellitus without complications: Secondary | ICD-10-CM | POA: Diagnosis not present

## 2021-12-31 DIAGNOSIS — I10 Essential (primary) hypertension: Secondary | ICD-10-CM | POA: Diagnosis not present

## 2021-12-31 DIAGNOSIS — Z79899 Other long term (current) drug therapy: Secondary | ICD-10-CM | POA: Diagnosis not present

## 2021-12-31 DIAGNOSIS — E7849 Other hyperlipidemia: Secondary | ICD-10-CM | POA: Diagnosis not present

## 2022-01-01 LAB — CBC WITH DIFFERENTIAL/PLATELET
Basophils Absolute: 0 10*3/uL (ref 0.0–0.2)
Basos: 0 %
EOS (ABSOLUTE): 0.2 10*3/uL (ref 0.0–0.4)
Eos: 2 %
Hematocrit: 37.8 % (ref 34.0–46.6)
Hemoglobin: 12.4 g/dL (ref 11.1–15.9)
Immature Grans (Abs): 0 10*3/uL (ref 0.0–0.1)
Immature Granulocytes: 0 %
Lymphocytes Absolute: 2 10*3/uL (ref 0.7–3.1)
Lymphs: 27 %
MCH: 28.4 pg (ref 26.6–33.0)
MCHC: 32.8 g/dL (ref 31.5–35.7)
MCV: 87 fL (ref 79–97)
Monocytes Absolute: 0.5 10*3/uL (ref 0.1–0.9)
Monocytes: 7 %
Neutrophils Absolute: 4.5 10*3/uL (ref 1.4–7.0)
Neutrophils: 64 %
Platelets: 358 10*3/uL (ref 150–450)
RBC: 4.37 x10E6/uL (ref 3.77–5.28)
RDW: 14.5 % (ref 11.7–15.4)
WBC: 7.2 10*3/uL (ref 3.4–10.8)

## 2022-01-01 LAB — LIPID PANEL
Chol/HDL Ratio: 4.9 ratio — ABNORMAL HIGH (ref 0.0–4.4)
Cholesterol, Total: 166 mg/dL (ref 100–199)
HDL: 34 mg/dL — ABNORMAL LOW (ref >39)
LDL Chol Calc (NIH): 113 mg/dL — ABNORMAL HIGH (ref 0–99)
Triglycerides: 100 mg/dL (ref 0–149)
VLDL Cholesterol Cal: 19 mg/dL (ref 5–40)

## 2022-01-01 LAB — COMPREHENSIVE METABOLIC PANEL
ALT: 22 IU/L (ref 0–32)
AST: 20 IU/L (ref 0–40)
Albumin/Globulin Ratio: 1.5 (ref 1.2–2.2)
Albumin: 4.4 g/dL (ref 3.9–4.9)
Alkaline Phosphatase: 76 IU/L (ref 44–121)
BUN/Creatinine Ratio: 15 (ref 9–23)
BUN: 11 mg/dL (ref 6–24)
Bilirubin Total: 0.5 mg/dL (ref 0.0–1.2)
CO2: 22 mmol/L (ref 20–29)
Calcium: 9.3 mg/dL (ref 8.7–10.2)
Chloride: 101 mmol/L (ref 96–106)
Creatinine, Ser: 0.72 mg/dL (ref 0.57–1.00)
Globulin, Total: 2.9 g/dL (ref 1.5–4.5)
Glucose: 79 mg/dL (ref 70–99)
Potassium: 3.9 mmol/L (ref 3.5–5.2)
Sodium: 138 mmol/L (ref 134–144)
Total Protein: 7.3 g/dL (ref 6.0–8.5)
eGFR: 105 mL/min/{1.73_m2} (ref >59)

## 2022-01-01 LAB — MICROALBUMIN / CREATININE URINE RATIO
Creatinine, Urine: 408.9 mg/dL
Microalb/Creat Ratio: 10 mg/g creat (ref 0–29)
Microalbumin, Urine: 41.8 ug/mL

## 2022-01-01 LAB — TSH: TSH: 1.8 u[IU]/mL (ref 0.450–4.500)

## 2022-01-01 LAB — HEMOGLOBIN A1C
Est. average glucose Bld gHb Est-mCnc: 111 mg/dL
Hgb A1c MFr Bld: 5.5 % (ref 4.8–5.6)

## 2022-01-16 ENCOUNTER — Ambulatory Visit (HOSPITAL_BASED_OUTPATIENT_CLINIC_OR_DEPARTMENT_OTHER): Payer: 59 | Admitting: Cardiology

## 2022-02-18 ENCOUNTER — Ambulatory Visit (HOSPITAL_BASED_OUTPATIENT_CLINIC_OR_DEPARTMENT_OTHER): Payer: 59 | Admitting: Cardiology

## 2022-03-01 ENCOUNTER — Other Ambulatory Visit: Payer: Self-pay | Admitting: Nurse Practitioner

## 2022-03-01 ENCOUNTER — Other Ambulatory Visit (HOSPITAL_COMMUNITY): Payer: Self-pay

## 2022-03-01 ENCOUNTER — Other Ambulatory Visit: Payer: Self-pay

## 2022-03-01 ENCOUNTER — Telehealth: Payer: Self-pay | Admitting: Family Medicine

## 2022-03-01 DIAGNOSIS — E119 Type 2 diabetes mellitus without complications: Secondary | ICD-10-CM

## 2022-03-01 DIAGNOSIS — I10 Essential (primary) hypertension: Secondary | ICD-10-CM

## 2022-03-01 MED ORDER — FUROSEMIDE 80 MG PO TABS
80.0000 mg | ORAL_TABLET | Freq: Every day | ORAL | 0 refills | Status: DC
  Filled 2022-03-01 – 2022-04-13 (×2): qty 90, 90d supply, fill #0

## 2022-03-01 MED ORDER — METFORMIN HCL ER 500 MG PO TB24
ORAL_TABLET | ORAL | 0 refills | Status: DC
  Filled 2022-03-01: qty 270, 90d supply, fill #0

## 2022-03-01 MED ORDER — TIRZEPATIDE 15 MG/0.5ML ~~LOC~~ SOAJ
15.0000 mg | SUBCUTANEOUS | 0 refills | Status: DC
  Filled 2022-03-01: qty 6, 84d supply, fill #0

## 2022-03-01 NOTE — Telephone Encounter (Signed)
  Prescription Request  03/01/2022  Is this a "Controlled Substance" medicine? No  LOV: Visit date not found   What is the name of the medication or equipment? metFORMIN (GLUCOPHAGE-XR) 500 MG 24 hr tablet [[id number]] furosemide (LASIX) 80 MG tablet [[id number]  Have you contacted your pharmacy to request a refill? Yes   Which pharmacy would you like this sent to?  [place] [address] Phone: [phone] Fax: [phone]  [facility] [phone] - [place], [place] AT [place] [address] [place] [address] Phone: [phone] Fax: [phone]  [place] [address] [place] [address] Phone: [phone] Fax: [phone]   Patient notified that their request is being sent to the clinical staff for review and that they should receive a response within 2 business days.   Please advise at [phone] (mobile)

## 2022-03-04 ENCOUNTER — Other Ambulatory Visit (HOSPITAL_COMMUNITY): Payer: Self-pay

## 2022-03-05 ENCOUNTER — Other Ambulatory Visit (HOSPITAL_COMMUNITY): Payer: Self-pay

## 2022-04-11 ENCOUNTER — Ambulatory Visit (HOSPITAL_BASED_OUTPATIENT_CLINIC_OR_DEPARTMENT_OTHER): Payer: 59 | Admitting: Cardiology

## 2022-04-13 ENCOUNTER — Other Ambulatory Visit (HOSPITAL_COMMUNITY): Payer: Self-pay

## 2022-04-18 ENCOUNTER — Other Ambulatory Visit (HOSPITAL_COMMUNITY): Payer: Self-pay

## 2022-04-25 ENCOUNTER — Ambulatory Visit (HOSPITAL_BASED_OUTPATIENT_CLINIC_OR_DEPARTMENT_OTHER): Payer: 59 | Admitting: Cardiology

## 2022-05-10 ENCOUNTER — Ambulatory Visit: Payer: Commercial Managed Care - PPO | Admitting: Nurse Practitioner

## 2022-05-10 ENCOUNTER — Encounter: Payer: Self-pay | Admitting: Nurse Practitioner

## 2022-05-10 ENCOUNTER — Other Ambulatory Visit (HOSPITAL_COMMUNITY): Payer: Self-pay

## 2022-05-10 VITALS — BP 122/81 | Ht 68.0 in | Wt 209.1 lb

## 2022-05-10 DIAGNOSIS — J4521 Mild intermittent asthma with (acute) exacerbation: Secondary | ICD-10-CM | POA: Diagnosis not present

## 2022-05-10 DIAGNOSIS — R600 Localized edema: Secondary | ICD-10-CM

## 2022-05-10 DIAGNOSIS — R609 Edema, unspecified: Secondary | ICD-10-CM | POA: Diagnosis not present

## 2022-05-10 DIAGNOSIS — E119 Type 2 diabetes mellitus without complications: Secondary | ICD-10-CM | POA: Diagnosis not present

## 2022-05-10 DIAGNOSIS — E7849 Other hyperlipidemia: Secondary | ICD-10-CM | POA: Diagnosis not present

## 2022-05-10 DIAGNOSIS — I10 Essential (primary) hypertension: Secondary | ICD-10-CM

## 2022-05-10 MED ORDER — METFORMIN HCL ER 500 MG PO TB24
1500.0000 mg | ORAL_TABLET | Freq: Every day | ORAL | 0 refills | Status: DC
  Filled 2022-05-10 – 2022-06-05 (×2): qty 270, 90d supply, fill #0

## 2022-05-10 MED ORDER — FUROSEMIDE 80 MG PO TABS
80.0000 mg | ORAL_TABLET | Freq: Every day | ORAL | 0 refills | Status: DC
  Filled 2022-05-10 – 2022-07-08 (×2): qty 90, 90d supply, fill #0

## 2022-05-10 MED ORDER — TRELEGY ELLIPTA 100-62.5-25 MCG/ACT IN AEPB
1.0000 | INHALATION_SPRAY | Freq: Every day | RESPIRATORY_TRACT | 5 refills | Status: DC
  Filled 2022-05-10: qty 60, 30d supply, fill #0

## 2022-05-10 MED ORDER — ALBUTEROL SULFATE HFA 108 (90 BASE) MCG/ACT IN AERS
2.0000 | INHALATION_SPRAY | RESPIRATORY_TRACT | 5 refills | Status: DC | PRN
  Filled 2022-05-10: qty 6.7, 17d supply, fill #0

## 2022-05-10 MED ORDER — DILTIAZEM HCL ER COATED BEADS 240 MG PO CP24
240.0000 mg | ORAL_CAPSULE | Freq: Every day | ORAL | 0 refills | Status: DC
  Filled 2022-05-10: qty 90, 90d supply, fill #0

## 2022-05-10 MED ORDER — PANTOPRAZOLE SODIUM 40 MG PO TBEC
40.0000 mg | DELAYED_RELEASE_TABLET | Freq: Every day | ORAL | 0 refills | Status: DC
  Filled 2022-05-10: qty 90, 90d supply, fill #0

## 2022-05-10 MED ORDER — TIRZEPATIDE 15 MG/0.5ML ~~LOC~~ SOAJ
15.0000 mg | SUBCUTANEOUS | 0 refills | Status: DC
  Filled 2022-05-10: qty 6, 84d supply, fill #0
  Filled 2022-05-13: qty 2, 28d supply, fill #0

## 2022-05-12 ENCOUNTER — Encounter: Payer: Self-pay | Admitting: Nurse Practitioner

## 2022-05-12 NOTE — Progress Notes (Signed)
Note is incorrect. See corrected score 05/10/22

## 2022-05-12 NOTE — Progress Notes (Addendum)
Subjective:    Patient ID: Christina Rodriguez, female    DOB: [birth year], 47 y.o.   MRN: [id number]  HPI Presents for routine follow-up on chronic health conditions.  Has an upcoming appointment with cardiology in February for routine follow-up.  Gets regular preventive health physicals with gynecology. Does not check her blood sugar on a regular basis the last fasting was around 100.  Currently on [facility], denies any side effects including abdominal pain nausea vomiting or bowel issues.  Mainly has noticed eating much smaller amounts.  No regular exercise but describes an active lifestyle.  Has her eye exam planned for early this year. Has had a chronic congested cough for over 3 months.  Minimal relief with Advair and currently use of Advair.  No fevers.  No chest pain.  Occasional wheezing.  Tries to limit her use of albuterol inhaler.  Non-smoker.  No vaping. Reflux stable on pantoprazole.    Review of Systems  Constitutional:  Negative for activity change, appetite change and fever.  Respiratory:  Positive for cough and wheezing. Negative for chest tightness and shortness of breath.   Cardiovascular:  Positive for leg swelling. Negative for chest pain.  Gastrointestinal:  Negative for abdominal pain, constipation, diarrhea, nausea and vomiting.          Objective:   Physical Exam NAD.  Alert, oriented.  Calm cheerful affect.  Lungs scattered expiratory wheezing, otherwise clear.  Heart regular rate rhythm.  Occasional congested cough noted.  No tachypnea.  Abdomen soft nondistended with no epigastric area tenderness noted on exam. Today's Vitals   05/10/22 1649  BP: 122/81  Weight: 209 lb 1.6 oz (94.8 kg)  Height: 5\' 8"  (1.727 m)   Body mass index is 31.79 kg/m.     Assessment & Plan:   Problem List Items Addressed This Visit       Cardiovascular and Mediastinum   Hypertension   Relevant Medications   diltiazem (CARDIZEM CD) 240 MG 24 hr capsule   furosemide  (LASIX) 80 MG tablet     Respiratory   Asthma with acute exacerbation   Relevant Medications   Fluticasone-Umeclidin-Vilant (TRELEGY ELLIPTA) 100-62.5-25 MCG/ACT AEPB   albuterol (VENTOLIN HFA) 108 (90 Base) MCG/ACT inhaler     Endocrine   Diabetes mellitus (HCC) - Primary   Relevant Medications   metFORMIN (GLUCOPHAGE-XR) 500 MG 24 hr tablet   tirzepatide (MOUNJARO) 15 MG/0.5ML Pen     Other   Other hyperlipidemia   Relevant Medications   diltiazem (CARDIZEM CD) 240 MG 24 hr capsule   furosemide (LASIX) 80 MG tablet   Peripheral edema   Meds ordered this encounter  Medications   Fluticasone-Umeclidin-Vilant (TRELEGY ELLIPTA) 100-62.5-25 MCG/ACT AEPB    Sig: Inhale 1 puff into the lungs daily.    Dispense:  60 each    Refill:  5    Order Specific Question:   Supervising Provider    Answer:   Sallee Lange A [[id number]]   albuterol (VENTOLIN HFA) 108 (90 Base) MCG/ACT inhaler    Sig: Inhale 2 puffs into the lungs every 4 (four) hours as needed for wheezing or shortness of breath.    Dispense:  18 g    Refill:  5    Order Specific Question:   Supervising Provider    Answer:   Sallee Lange A [[id number]]   diltiazem (CARDIZEM CD) 240 MG 24 hr capsule    Sig: Take 1 capsule (240 mg total) by mouth daily.  Dispense:  90 capsule    Refill:  0    Order Specific Question:   Supervising Provider    Answer:   Lilyan Punt A [[id number]]   furosemide (LASIX) 80 MG tablet    Sig: Take 1 tablet (80 mg total) by mouth daily.    Dispense:  90 tablet    Refill:  0    Order Specific Question:   Supervising Provider    Answer:   Lilyan Punt A [[id number]]   metFORMIN (GLUCOPHAGE-XR) 500 MG 24 hr tablet    Sig: Take 3 tablets (1,500 mg total) by mouth daily with breakfast.    Dispense:  270 tablet    Refill:  0    Order Specific Question:   Supervising Provider    Answer:   Lilyan Punt A [[id number]]   pantoprazole (PROTONIX) 40 MG tablet    Sig: Take 1 tablet (40 mg total) by mouth daily.     Dispense:  90 tablet    Refill:  0    Order Specific Question:   Supervising Provider    Answer:   Lilyan Punt A [[id number]]   tirzepatide (MOUNJARO) 15 MG/0.5ML Pen    Sig: Inject 15 mg into the skin once a week.    Dispense:  6 mL    Refill:  0    DIagnosis: E11.9;  Currently on Metformin daily    Order Specific Question:   Supervising Provider    Answer:   Lilyan Punt A [[id number]]   Continue Mounjaro at current dose.  Encourage patient to continue successful weight loss efforts.  Encouraged regular exercise. Continue other medications as directed.  Discussed options regarding her persistent asthma flareup and cough.  Defers increasing Symbicort, elects to switch to Trelegy as directed.  Contact office in 2 to 3 weeks if not significantly improved. Follow-up with cardiology as planned in February. Return in about 3 months (around 08/09/2022). Call back sooner if needed.

## 2022-05-12 NOTE — Progress Notes (Signed)
Incorrect score. Updated. All scores are zero

## 2022-05-13 ENCOUNTER — Other Ambulatory Visit (HOSPITAL_COMMUNITY): Payer: Self-pay

## 2022-05-13 ENCOUNTER — Other Ambulatory Visit: Payer: Self-pay

## 2022-05-14 ENCOUNTER — Other Ambulatory Visit (HOSPITAL_COMMUNITY): Payer: Self-pay

## 2022-06-05 ENCOUNTER — Other Ambulatory Visit (HOSPITAL_COMMUNITY): Payer: Self-pay

## 2022-06-05 ENCOUNTER — Other Ambulatory Visit: Payer: Self-pay | Admitting: Nurse Practitioner

## 2022-06-05 ENCOUNTER — Other Ambulatory Visit: Payer: Self-pay

## 2022-06-06 ENCOUNTER — Other Ambulatory Visit (HOSPITAL_COMMUNITY): Payer: Self-pay

## 2022-06-06 MED ORDER — MOUNJARO 15 MG/0.5ML ~~LOC~~ SOAJ
15.0000 mg | SUBCUTANEOUS | 2 refills | Status: DC
  Filled 2022-06-06: qty 2, 28d supply, fill #0
  Filled 2022-06-07: qty 6, 84d supply, fill #0
  Filled 2022-09-03: qty 2, 28d supply, fill #1

## 2022-06-07 ENCOUNTER — Encounter (HOSPITAL_BASED_OUTPATIENT_CLINIC_OR_DEPARTMENT_OTHER): Payer: Self-pay | Admitting: Cardiology

## 2022-06-07 ENCOUNTER — Ambulatory Visit (HOSPITAL_BASED_OUTPATIENT_CLINIC_OR_DEPARTMENT_OTHER): Payer: Commercial Managed Care - PPO | Admitting: Cardiology

## 2022-06-07 ENCOUNTER — Other Ambulatory Visit: Payer: Self-pay

## 2022-06-07 VITALS — BP 114/78 | HR 81 | Ht 68.0 in | Wt 215.0 lb

## 2022-06-07 DIAGNOSIS — I471 Supraventricular tachycardia, unspecified: Secondary | ICD-10-CM

## 2022-06-07 DIAGNOSIS — Z7189 Other specified counseling: Secondary | ICD-10-CM | POA: Diagnosis not present

## 2022-06-07 DIAGNOSIS — R0602 Shortness of breath: Secondary | ICD-10-CM | POA: Diagnosis not present

## 2022-06-07 NOTE — Progress Notes (Signed)
Cardiology Office Note   Date:  06/07/2022   ID:  Sareen, Reymond 06/22/1975, MRN [id number] The patient was identified using 2 identifiers.  PCP:  Kathyrn Drown, MD   [facility] Providers Cardiologist:  Buford Dresser, MD     Evaluation Performed:  Follow-Up Visit  Chief Complaint:  Follow up  History of Present Illness:    Christina Rodriguez is a 47 y.o. female with PMH paroxysmal SVT, VSD s/p repair, bilateral LE edema. She presents for follow-up today.  At her last visit she was feeling better on Lasix, compared with HCTZ and torsemide. Her swelling and shortness of breath were less. Her palpitations also improved from once a day to once a week. She was also doing well with [facility].  Today, she states she is feeling good. Her shortness of breath and swelling have improved. Occasionally she feels a "skip" palpitation. She denies any racing heart beats.   In the past month she has been on Trelegy, which she notes has significantly improved her wheezing.  We reviewed her lipid panel from 12/2021 showing triglycerides 100, HDL 34, LDL 113. Additionally she has done well with weight loss, losing 50 lbs (266 lb in 09/2020, 215 lb in clinic today). She attributes this to her diet and exercise, and her diabetes being well controlled.  She denies any chest pain, lightheadedness, headaches, syncope, orthopnea, or PND.   Past Medical History:  Diagnosis Date   Adenomyosis    Anemia    HIstory of    Asthma    albuterol inhaler prn-recent uri-using more freq   Diabetes mellitus without complication ([place])    Dysrhythmia    history of svt-controlled on cardizem-diagnosed in [place] bladder disease    GERD (gastroesophageal reflux disease)    uses protonix   Headache    migraines    History of polycystic ovarian disease    Infertility associated with anovulation    Metabolic syndrome    Multiple food allergies    shrimp    Osteoarthritis    Palpitations    PCOS (polycystic ovarian syndrome)    PONV (postoperative nausea and vomiting)    Prediabetes    Recurrent upper respiratory infection (URI)    resolving   SVT (supraventricular tachycardia)    History of    Vitamin D deficiency    VSD (ventricular septal defect)    history of -no problems   Past Surgical History:  Procedure Laterality Date   CESAREAN SECTION  2010   CESAREAN SECTION  11/16/2010   Procedure: CESAREAN SECTION;  Surgeon: Marcial Pacas;  Location: [facility];  Service: Gynecology;  Laterality: N/A;  Repeat    CHOLECYSTECTOMY  2003   DILATION AND CURETTAGE OF UTERUS     HYSTEROSCOPY WITH D & C N/A 12/06/2014   Procedure: DILATATION AND CURETTAGE /HYSTEROSCOPY;  Surgeon: Vanessa Kick, MD;  Location: [facility];  Service: Gynecology;  Laterality: N/A;   uterine polyps       Current Meds  Medication Sig   albuterol (VENTOLIN HFA) 108 (90 Base) MCG/ACT inhaler Inhale 2 puffs into the lungs every 4 (four) hours as needed for wheezing or shortness of breath.   Continuous Blood Gluc Receiver (FREESTYLE LIBRE READER) DEVI Use reader as directed with sensors   Continuous Blood Gluc Sensor (FREESTYLE LIBRE 14 DAY SENSOR) MISC Apply as directed every 14 days   cyanocobalamin 500 MCG tablet Take 500 mcg by mouth daily.  diltiazem (CARDIZEM CD) 240 MG 24 hr capsule Take 1 capsule (240 mg total) by mouth daily.   EPINEPHrine 0.3 mg/0.3 mL IJ SOAJ injection Inject 0.3 mg into the muscle once. Reported on 05/03/2015   fexofenadine (ALLEGRA) 180 MG tablet Take by mouth.   Fluticasone-Umeclidin-Vilant (TRELEGY ELLIPTA) 100-62.5-25 MCG/ACT AEPB Inhale 1 puff into the lungs daily.   furosemide (LASIX) 80 MG tablet Take 1 tablet (80 mg total) by mouth daily.   ibuprofen (ADVIL,MOTRIN) 200 MG tablet Take 400 mg by mouth every 6 (six) hours as needed for headache, mild pain or moderate pain.   Insulin Pen Needle (PEN NEEDLES) 32G X 5 MM MISC Inject 1.8 mg into the  skin daily. Use with Victoza to inject into skin   levonorgestrel (MIRENA, 52 MG,) 20 MCG/DAY IUD Mirena 20 mcg/24 hours (8 yrs) 52 mg intrauterine device  Take 1 device by intrauterine route.   metFORMIN (GLUCOPHAGE-XR) 500 MG 24 hr tablet Take 3 tablets (1,500 mg total) by mouth daily with breakfast.   Multiple Vitamins-Minerals (MULTIVITAMIN ADULT PO) Take 1 tablet by mouth daily.   ondansetron (ZOFRAN-ODT) 8 MG disintegrating tablet Take 1 tablet (8 mg total) by mouth every 8 (eight) hours as needed for nausea or vomiting.   pantoprazole (PROTONIX) 40 MG tablet Take 1 tablet (40 mg total) by mouth daily.   potassium chloride (KLOR-CON) 10 MEQ tablet Take 2 tablets by mouth daily.   tirzepatide (MOUNJARO) 15 MG/0.5ML Pen Inject 15 mg into the skin once a week.   triamcinolone cream (KENALOG) 0.1 % Apply to affected area 2 times a day as needed for rash, use up to 2 weeks in one area     Allergies:   Beta adrenergic blockers, Shrimp [shellfish allergy], and Soybeans   Social History   Tobacco Use   Smoking status: Never   Smokeless tobacco: Never  Substance Use Topics   Alcohol use: No   Drug use: No     Family Hx: The patient's family history includes Colon cancer in her brother; Diabetes in her father; Hyperlipidemia in her father; Hypertension in her father; Obesity in her father; Sleep apnea in her father; Stomach cancer in her maternal grandmother. There is no history of Colon polyps, Esophageal cancer, or Rectal cancer.  ROS:   Please see the history of present illness.    (+) Palpitations All other systems reviewed and are negative.   Prior CV studies:   The following studies were reviewed today:  Monitor 08/03/20 Minimum HR was 60 BPM, maximum HR wa 138 BPM, the patient was in SR the entire monitoring time. Occassional PACs and PVCs, no other arrhythmias.   Normal 30 day event monitor, occassional PACs and PVCs, no other arrhythmias.  Echo 05/25/20 1. Left  ventricular ejection fraction, by estimation, is 55 to 60%. The  left ventricle has normal function. The left ventricle has no regional  wall motion abnormalities. Left ventricular diastolic parameters were  normal.   2. Reported history of VSD status post repair - no residual shunting is  noted.   3. Right ventricular systolic function is normal. The right ventricular  size is normal. Tricuspid regurgitation signal is inadequate for assessing  PA pressure.   4. The mitral valve is grossly normal. Trivial mitral valve  regurgitation.   5. The aortic valve is tricuspid. Aortic valve regurgitation is not  visualized.   6. The inferior vena cava is normal in size with greater than 50%  respiratory variability, suggesting right atrial  pressure of 3 mmHg.   Labs/Other Tests and Data Reviewed:    EKG:  EKG is personally reviewed. 06/07/2022:  NSR at 81 bpm 05/12/2020:  NSR    Recent Labs: 12/31/2021: ALT 22; BUN 11; Creatinine, Ser 0.72; Hemoglobin 12.4; Platelets 358; Potassium 3.9; Sodium 138; TSH 1.800   Recent Lipid Panel Lab Results  Component Value Date/Time   CHOL 166 12/31/2021 08:39 AM   TRIG 100 12/31/2021 08:39 AM   HDL 34 (L) 12/31/2021 08:39 AM   CHOLHDL 4.9 (H) 12/31/2021 08:39 AM   CHOLHDL 4.6 01/20/2014 01:17 PM   LDLCALC 113 (H) 12/31/2021 08:39 AM    Wt Readings from Last 3 Encounters:  06/07/22 215 lb (97.5 kg)  05/10/22 209 lb 1.6 oz (94.8 kg)  09/28/21 244 lb (110.7 kg)     Risk Assessment/Calculations:      Objective:    Vital Signs:  BP 114/78   Pulse 81   Ht '5\' 8"'$  (1.727 m)   Wt 215 lb (97.5 kg)   BMI 32.69 kg/m    GEN: Well nourished, well developed in no acute distress HEENT: Normal, moist mucous membranes NECK: No JVD CARDIAC: regular rhythm, normal S1 and S2, no rubs or gallops. No murmur. VASCULAR: Radial and DP pulses 2+ bilaterally. No carotid bruits RESPIRATORY:  Clear to auscultation without rales, wheezing or rhonchi  ABDOMEN: Soft,  non-tender, non-distended MUSCULOSKELETAL:  Ambulates independently SKIN: Warm and dry, no edema NEUROLOGIC:  Alert and oriented x 3. No focal neuro deficits noted. PSYCHIATRIC:  Normal affect    ASSESSMENT & PLAN:    1. SVT (supraventricular tachycardia)   2. Shortness of breath   3. Cardiac risk counseling   4. Counseling on health promotion and disease prevention     Bilateral LE edema, intermittent Chronic shortness of breath History of VSD s/p repair -better on lasix; HCTZ and torsemide were not as helpful -no residual VSD on echo  Palpitations, history of pSVT -monitor, echo reassuring -symptoms improved with use of lasix  CV risk counseling and prevention: working with [facility] -recommend heart healthy/Mediterranean diet, with whole grains, fruits, vegetable, fish, lean meats, nuts, and olive oil. Limit salt. -recommend moderate walking, 3-5 times/week for 30-50 minutes each session. Aim for at least 150 minutes.week. Goal should be pace of 3 miles/hours, or walking 1.5 miles in 30 minutes -recommend avoidance of tobacco products. Avoid excess alcohol. -ASCVD risk score: The 10-year ASCVD risk score (Arnett DK, et al., 2019) is: 2.8%   Values used to calculate the score:     Age: 40 years     Sex: Female     Is Non-Hispanic African American: No     Diabetic: Yes     Tobacco smoker: No     Systolic Blood Pressure: 99991111 mmHg     Is BP treated: Yes     HDL Cholesterol: 34 mg/dL     Total Cholesterol: 166 mg/dL   Plan for Follow Up:  1 year or sooner as needed.  Medication Adjustments/Labs and Tests Ordered: Current medicines are reviewed at length with the patient today.  Concerns regarding medicines are outlined above.   Tests Ordered: Orders Placed This Encounter  Procedures   EKG 12-Lead   Medication Changes: No orders of the defined types were placed in this encounter.  Patient Instructions  Medication Instructions:  Your Physician  recommend you continue on your current medication as directed.    *If you need a refill on your cardiac  medications before your next appointment, please call your pharmacy*   Follow-Up: At [facility], you and your health needs are our priority.  As part of our continuing mission to provide you with exceptional heart care, we have created designated Provider Care Teams.  These Care Teams include your primary Cardiologist (physician) and Advanced Practice Providers (APPs -  Physician Assistants and Nurse Practitioners) who all work together to provide you with the care you need, when you need it.  We recommend signing up for the patient portal called "MyChart".  Sign up information is provided on this After Visit Summary.  MyChart is used to connect with patients for Virtual Visits (Telemedicine).  Patients are able to view lab/test results, encounter notes, upcoming appointments, etc.  Non-urgent messages can be sent to your provider as well.   To learn more about what you can do with MyChart, go to [email or web address].    Your next appointment:   1 year(s)  Provider:   Buford Dresser, MD     A Rosie Place Stumpf,acting as a scribe for Buford Dresser, MD.,have documented all relevant documentation on the behalf of Buford Dresser, MD,as directed by  Buford Dresser, MD while in the presence of Buford Dresser, MD.  I, Buford Dresser, MD, have reviewed all documentation for this visit. The documentation on 07/10/22 for the exam, diagnosis, procedures, and orders are all accurate and complete.   Signed, Buford Dresser, MD  06/07/2022     [facility]

## 2022-06-07 NOTE — Patient Instructions (Signed)
Medication Instructions:  Your Physician recommend you continue on your current medication as directed.    *If you need a refill on your cardiac medications before your next appointment, please call your pharmacy*  Follow-Up: At [facility], you and your health needs are our priority.  As part of our continuing mission to provide you with exceptional heart care, we have created designated Provider Care Teams.  These Care Teams include your primary Cardiologist (physician) and Advanced Practice Providers (APPs -  Physician Assistants and Nurse Practitioners) who all work together to provide you with the care you need, when you need it.  We recommend signing up for the patient portal called "MyChart".  Sign up information is provided on this After Visit Summary.  MyChart is used to connect with patients for Virtual Visits (Telemedicine).  Patients are able to view lab/test results, encounter notes, upcoming appointments, etc.  Non-urgent messages can be sent to your provider as well.   To learn more about what you can do with MyChart, go to [email or web address].    Your next appointment:   1 year(s)  Provider:   Bridgette Christopher, MD  

## 2022-06-11 DIAGNOSIS — H5203 Hypermetropia, bilateral: Secondary | ICD-10-CM | POA: Diagnosis not present

## 2022-07-08 ENCOUNTER — Other Ambulatory Visit (HOSPITAL_COMMUNITY): Payer: Self-pay

## 2022-07-08 ENCOUNTER — Ambulatory Visit: Payer: Commercial Managed Care - PPO

## 2022-07-08 ENCOUNTER — Other Ambulatory Visit: Payer: Self-pay

## 2022-07-10 ENCOUNTER — Encounter (HOSPITAL_BASED_OUTPATIENT_CLINIC_OR_DEPARTMENT_OTHER): Payer: Self-pay | Admitting: Cardiology

## 2022-08-13 ENCOUNTER — Encounter: Payer: Self-pay | Admitting: Family Medicine

## 2022-08-23 ENCOUNTER — Other Ambulatory Visit: Payer: Self-pay

## 2022-08-23 ENCOUNTER — Other Ambulatory Visit: Payer: Self-pay | Admitting: Nurse Practitioner

## 2022-08-23 ENCOUNTER — Other Ambulatory Visit (HOSPITAL_COMMUNITY): Payer: Self-pay

## 2022-08-23 MED ORDER — TIRZEPATIDE 12.5 MG/0.5ML ~~LOC~~ SOAJ
12.5000 mg | SUBCUTANEOUS | 0 refills | Status: DC
  Filled 2022-08-23: qty 2, 28d supply, fill #0

## 2022-09-03 ENCOUNTER — Other Ambulatory Visit (HOSPITAL_COMMUNITY): Payer: Self-pay

## 2022-09-11 ENCOUNTER — Other Ambulatory Visit (HOSPITAL_COMMUNITY): Payer: Self-pay

## 2022-09-18 ENCOUNTER — Other Ambulatory Visit (HOSPITAL_COMMUNITY): Payer: Self-pay

## 2022-09-24 ENCOUNTER — Other Ambulatory Visit (HOSPITAL_COMMUNITY): Payer: Self-pay

## 2022-09-25 ENCOUNTER — Other Ambulatory Visit (HOSPITAL_COMMUNITY): Payer: Self-pay

## 2022-09-25 ENCOUNTER — Other Ambulatory Visit: Payer: Self-pay | Admitting: Nurse Practitioner

## 2022-09-25 ENCOUNTER — Other Ambulatory Visit: Payer: Self-pay

## 2022-09-25 DIAGNOSIS — E119 Type 2 diabetes mellitus without complications: Secondary | ICD-10-CM

## 2022-09-25 MED ORDER — TIRZEPATIDE 12.5 MG/0.5ML ~~LOC~~ SOAJ
12.5000 mg | SUBCUTANEOUS | 2 refills | Status: DC
  Filled 2022-09-25 – 2022-09-26 (×3): qty 2, 28d supply, fill #0
  Filled 2022-10-22: qty 2, 28d supply, fill #1
  Filled 2022-11-13: qty 2, 28d supply, fill #2

## 2022-09-26 ENCOUNTER — Other Ambulatory Visit: Payer: Self-pay

## 2022-09-26 ENCOUNTER — Other Ambulatory Visit (HOSPITAL_COMMUNITY): Payer: Self-pay

## 2022-09-26 MED ORDER — PANTOPRAZOLE SODIUM 40 MG PO TBEC
40.0000 mg | DELAYED_RELEASE_TABLET | Freq: Every day | ORAL | 1 refills | Status: DC
  Filled 2022-09-26: qty 90, 90d supply, fill #0

## 2022-09-26 MED ORDER — METFORMIN HCL ER 500 MG PO TB24
1500.0000 mg | ORAL_TABLET | Freq: Every day | ORAL | 1 refills | Status: DC
  Filled 2022-09-26: qty 270, 90d supply, fill #0

## 2022-09-27 ENCOUNTER — Other Ambulatory Visit: Payer: Self-pay

## 2022-10-02 ENCOUNTER — Other Ambulatory Visit: Payer: Self-pay | Admitting: Nurse Practitioner

## 2022-10-02 DIAGNOSIS — I10 Essential (primary) hypertension: Secondary | ICD-10-CM

## 2022-10-03 ENCOUNTER — Other Ambulatory Visit (HOSPITAL_COMMUNITY): Payer: Self-pay

## 2022-10-03 MED ORDER — FUROSEMIDE 80 MG PO TABS
80.0000 mg | ORAL_TABLET | Freq: Every day | ORAL | 2 refills | Status: DC
  Filled 2022-10-03: qty 90, 90d supply, fill #0

## 2022-10-04 ENCOUNTER — Other Ambulatory Visit: Payer: Self-pay

## 2022-10-21 DIAGNOSIS — Z1231 Encounter for screening mammogram for malignant neoplasm of breast: Secondary | ICD-10-CM | POA: Diagnosis not present

## 2022-10-21 DIAGNOSIS — N760 Acute vaginitis: Secondary | ICD-10-CM | POA: Diagnosis not present

## 2022-10-21 DIAGNOSIS — Z01419 Encounter for gynecological examination (general) (routine) without abnormal findings: Secondary | ICD-10-CM | POA: Diagnosis not present

## 2022-10-22 ENCOUNTER — Other Ambulatory Visit (HOSPITAL_COMMUNITY): Payer: Self-pay

## 2022-10-25 ENCOUNTER — Other Ambulatory Visit (HOSPITAL_COMMUNITY): Payer: Self-pay

## 2022-10-25 MED ORDER — FLUCONAZOLE 150 MG PO TABS
150.0000 mg | ORAL_TABLET | ORAL | 0 refills | Status: DC
  Filled 2022-10-25: qty 2, 3d supply, fill #0

## 2022-10-30 ENCOUNTER — Ambulatory Visit: Payer: Commercial Managed Care - PPO

## 2022-10-30 ENCOUNTER — Other Ambulatory Visit: Payer: Self-pay | Admitting: Nurse Practitioner

## 2022-10-30 MED ORDER — TERCONAZOLE 0.4 % VA CREA: 1.0000 | TOPICAL_CREAM | Freq: Every day | VAGINAL | 0 refills | Status: DC

## 2022-11-13 ENCOUNTER — Other Ambulatory Visit: Payer: Self-pay | Admitting: Nurse Practitioner

## 2022-11-13 MED ORDER — AZITHROMYCIN 250 MG PO TABS: ORAL_TABLET | ORAL | 0 refills | Status: AC

## 2022-11-13 NOTE — Progress Notes (Signed)
Presents for c/o slightly swollen area in the left throat that began yesterday. No sore throat. No fever. No difficulty swallowing. Exam: pharynx non erythematous, no exudate. Soft fluctuant white lesion noted left tonsillar area. Nontender to touch. Will send in Zpack. Warning signs reviewed. Recheck if worsens or persists.

## 2022-11-14 ENCOUNTER — Other Ambulatory Visit (HOSPITAL_COMMUNITY): Payer: Self-pay

## 2022-12-06 ENCOUNTER — Other Ambulatory Visit (HOSPITAL_COMMUNITY): Payer: Self-pay

## 2022-12-06 ENCOUNTER — Encounter: Payer: Self-pay | Admitting: Nurse Practitioner

## 2022-12-06 ENCOUNTER — Ambulatory Visit (INDEPENDENT_AMBULATORY_CARE_PROVIDER_SITE_OTHER): Payer: Commercial Managed Care - PPO | Admitting: Nurse Practitioner

## 2022-12-06 ENCOUNTER — Other Ambulatory Visit: Payer: Self-pay

## 2022-12-06 VITALS — BP 106/73 | HR 84 | Wt 206.0 lb

## 2022-12-06 DIAGNOSIS — Z130 Encounter for screening for diseases of the blood and blood-forming organs and certain disorders involving the immune mechanism: Secondary | ICD-10-CM

## 2022-12-06 DIAGNOSIS — J4520 Mild intermittent asthma, uncomplicated: Secondary | ICD-10-CM

## 2022-12-06 DIAGNOSIS — Z7985 Long-term (current) use of injectable non-insulin antidiabetic drugs: Secondary | ICD-10-CM

## 2022-12-06 DIAGNOSIS — E119 Type 2 diabetes mellitus without complications: Secondary | ICD-10-CM | POA: Diagnosis not present

## 2022-12-06 DIAGNOSIS — I10 Essential (primary) hypertension: Secondary | ICD-10-CM

## 2022-12-06 DIAGNOSIS — Z1329 Encounter for screening for other suspected endocrine disorder: Secondary | ICD-10-CM

## 2022-12-06 MED ORDER — DILTIAZEM HCL ER COATED BEADS 240 MG PO CP24
240.0000 mg | ORAL_CAPSULE | Freq: Every day | ORAL | 1 refills | Status: AC
  Filled 2022-12-06: qty 90, 90d supply, fill #0

## 2022-12-06 MED ORDER — MOUNJARO 15 MG/0.5ML ~~LOC~~ SOAJ
15.0000 mg | SUBCUTANEOUS | 2 refills | Status: DC
  Filled 2022-12-06 – 2022-12-09 (×3): qty 2, 28d supply, fill #0
  Filled 2023-01-01: qty 2, 28d supply, fill #1
  Filled 2023-02-04: qty 2, 28d supply, fill #2
  Filled 2023-03-04: qty 2, 28d supply, fill #3
  Filled 2023-04-04: qty 2, 28d supply, fill #4

## 2022-12-06 MED ORDER — PANTOPRAZOLE SODIUM 40 MG PO TBEC
40.0000 mg | DELAYED_RELEASE_TABLET | Freq: Every day | ORAL | 1 refills | Status: DC
  Filled 2022-12-06: qty 90, 90d supply, fill #0

## 2022-12-06 MED ORDER — METFORMIN HCL ER 500 MG PO TB24
1500.0000 mg | ORAL_TABLET | Freq: Every day | ORAL | 1 refills | Status: DC
  Filled 2022-12-06 – 2023-01-01 (×2): qty 270, 90d supply, fill #0
  Filled 2023-04-04: qty 270, 90d supply, fill #1

## 2022-12-06 MED ORDER — CELECOXIB 200 MG PO CAPS
200.0000 mg | ORAL_CAPSULE | Freq: Every day | ORAL | 0 refills | Status: DC | PRN
  Filled 2022-12-06: qty 90, 90d supply, fill #0

## 2022-12-06 MED ORDER — FUROSEMIDE 80 MG PO TABS
80.0000 mg | ORAL_TABLET | Freq: Every day | ORAL | 1 refills | Status: DC
  Filled 2022-12-06 – 2023-01-01 (×2): qty 90, 90d supply, fill #0
  Filled 2023-04-04: qty 90, 90d supply, fill #1

## 2022-12-06 MED ORDER — TRELEGY ELLIPTA 100-62.5-25 MCG/ACT IN AEPB
1.0000 | INHALATION_SPRAY | Freq: Every day | RESPIRATORY_TRACT | 5 refills | Status: AC
  Filled 2022-12-06: qty 60, 30d supply, fill #0

## 2022-12-06 MED ORDER — POTASSIUM CHLORIDE ER 10 MEQ PO TBCR
20.0000 meq | EXTENDED_RELEASE_TABLET | Freq: Every day | ORAL | 1 refills | Status: DC
  Filled 2022-12-06: qty 180, 90d supply, fill #0

## 2022-12-06 NOTE — Progress Notes (Unsigned)
Subjective:    Patient ID: Christina Rodriguez, female    DOB: 1975-11-07, 47 y.o.   MRN: [id number]  HPI Presents for routine follow-up for diabetes and hypertension.  Currently on Mounjaro 12.5 mg weekly, denies any adverse effects.  Could not increase to 15 mg at the time because medication was not available.  States her weight has plateaued.  Takes occasional pantoprazole for acid reflux.  Relates this mainly to certain foods such as [place] or [place].  Also takes 2-4 Advil every day for various joint pains.  Sees cardiology once a year.  Regular vision exams, her last one was in February this year.  Sees gynecologist for her regular preventive health physicals.  Has Mirena for birth control. Asthma stable on Trelegy.  Review of Systems  HENT:  Negative for sore throat and trouble swallowing.   Respiratory:  Negative for cough, chest tightness, shortness of breath and wheezing.   Cardiovascular:  Negative for chest pain and leg swelling.  Gastrointestinal:  Negative for abdominal pain, constipation, diarrhea, nausea and vomiting.      12/06/2022    1:15 PM  Depression screen PHQ 2/9  Decreased Interest 0  Down, Depressed, Hopeless 0  PHQ - 2 Score 0  Altered sleeping 0  Tired, decreased energy 0  Change in appetite 0  Feeling bad or failure about yourself  0  Trouble concentrating 0  Moving slowly or fidgety/restless 0  Suicidal thoughts 0  PHQ-9 Score 0      12/06/2022    1:15 PM 05/12/2022    4:44 PM 05/12/2022    4:22 PM 05/10/2022    3:30 PM  GAD 7 : Generalized Anxiety Score  Nervous, Anxious, on Edge 0  0 0  Control/stop worrying 0  0 0  Worry too much - different things 0  0 0  Trouble relaxing 0  0 0  Restless 0  0 0  Easily annoyed or irritable 0 0 0 0  Afraid - awful might happen 0  0 0  Total GAD 7 Score 0  0 0  Anxiety Difficulty  Not difficult at all Not difficult at all Not difficult at all          Objective:   Physical Exam NAD.  Alert, oriented.   Thyroid nontender to palpation, no mass or goiter noted.  Lungs clear.  Heart regular rate rhythm. Today's Vitals   12/06/22 1118  BP: 106/73  Pulse: 84  SpO2: 99%  Weight: 206 lb (93.4 kg)   Body mass index is 31.32 kg/m.        Assessment & Plan:   Problem List Items Addressed This Visit       Cardiovascular and Mediastinum   Hypertension   Relevant Medications   diltiazem (CARDIZEM CD) 240 MG 24 hr capsule   furosemide (LASIX) 80 MG tablet   Other Relevant Orders   Comprehensive metabolic panel   Lipid panel     Respiratory   Asthma, stable   Relevant Medications   Fluticasone-Umeclidin-Vilant (TRELEGY ELLIPTA) 100-62.5-25 MCG/ACT AEPB     Endocrine   Diabetes mellitus (HCC)   Relevant Medications   metFORMIN (GLUCOPHAGE-XR) 500 MG 24 hr tablet   tirzepatide (MOUNJARO) 15 MG/0.5ML Pen   Other Relevant Orders   Comprehensive metabolic panel   Hemoglobin A1c   Lipid panel   Microalbumin/Creatinine Ratio, Urine   Other Visit Diagnoses     Screening for deficiency anemia    -  Primary  Relevant Orders   CBC with Differential/Platelet   Screening for thyroid disorder       Relevant Orders   TSH      Meds ordered this encounter  Medications   diltiazem (CARDIZEM CD) 240 MG 24 hr capsule    Sig: Take 1 capsule (240 mg total) by mouth daily.    Dispense:  90 capsule    Refill:  1    Order Specific Question:   Supervising Provider    Answer:   Lilyan Punt A [[id number]]   Fluticasone-Umeclidin-Vilant (TRELEGY ELLIPTA) 100-62.5-25 MCG/ACT AEPB    Sig: Inhale 1 puff into the lungs daily.    Dispense:  60 each    Refill:  5    Order Specific Question:   Supervising Provider    Answer:   Lilyan Punt A [[id number]]   furosemide (LASIX) 80 MG tablet    Sig: Take 1 tablet (80 mg total) by mouth daily.    Dispense:  90 tablet    Refill:  1    Order Specific Question:   Supervising Provider    Answer:   Lilyan Punt A [[id number]]   metFORMIN (GLUCOPHAGE-XR) 500 MG 24  hr tablet    Sig: Take 3 tablets (1,500 mg total) by mouth daily with breakfast.    Dispense:  270 tablet    Refill:  1    Order Specific Question:   Supervising Provider    Answer:   Lilyan Punt A [[id number]]   pantoprazole (PROTONIX) 40 MG tablet    Sig: Take 1 tablet (40 mg total) by mouth daily.    Dispense:  90 tablet    Refill:  1    Order Specific Question:   Supervising Provider    Answer:   Lilyan Punt A [[id number]]   potassium chloride (KLOR-CON) 10 MEQ tablet    Sig: Take 2 tablets (20 mEq total) by mouth daily.    Dispense:  180 tablet    Refill:  1    Order Specific Question:   Supervising Provider    Answer:   Lilyan Punt A [[id number]]   tirzepatide (MOUNJARO) 15 MG/0.5ML Pen    Sig: Inject 15 mg into the skin once a week.    Dispense:  6 mL    Refill:  2    DIagnosis: E11.9;  Currently on Metformin daily    Order Specific Question:   Supervising Provider    Answer:   Lilyan Punt A [[id number]]   celecoxib (CELEBREX) 200 MG capsule    Sig: Take 1 capsule (200 mg total) by mouth daily as needed for pain    Dispense:  90 capsule    Refill:  0    Order Specific Question:   Supervising Provider    Answer:   Lilyan Punt A [[id number]]   Labs pending. Increase Mounjaro to 15 mg weekly.  Call back if any adverse effects. Continue other medications as directed. Switch from OTC ibuprofen to Celebrex daily as directed.  Take with food.  Call back if any reflux symptoms. Continue follow-up with cardiology and gynecology. Recommend flu vaccine this fall. Return in about 6 months (around 06/08/2023).

## 2022-12-07 ENCOUNTER — Encounter: Payer: Self-pay | Admitting: Nurse Practitioner

## 2022-12-09 ENCOUNTER — Other Ambulatory Visit: Payer: Self-pay

## 2022-12-09 ENCOUNTER — Other Ambulatory Visit (HOSPITAL_BASED_OUTPATIENT_CLINIC_OR_DEPARTMENT_OTHER): Payer: Self-pay

## 2022-12-09 ENCOUNTER — Other Ambulatory Visit (HOSPITAL_COMMUNITY): Payer: Self-pay

## 2022-12-10 ENCOUNTER — Other Ambulatory Visit (HOSPITAL_BASED_OUTPATIENT_CLINIC_OR_DEPARTMENT_OTHER): Payer: Self-pay

## 2022-12-10 ENCOUNTER — Other Ambulatory Visit (HOSPITAL_COMMUNITY): Payer: Self-pay

## 2022-12-11 ENCOUNTER — Other Ambulatory Visit: Payer: Self-pay

## 2022-12-11 DIAGNOSIS — J358 Other chronic diseases of tonsils and adenoids: Secondary | ICD-10-CM

## 2022-12-12 DIAGNOSIS — I10 Essential (primary) hypertension: Secondary | ICD-10-CM | POA: Diagnosis not present

## 2022-12-12 DIAGNOSIS — E119 Type 2 diabetes mellitus without complications: Secondary | ICD-10-CM | POA: Diagnosis not present

## 2022-12-12 DIAGNOSIS — Z1329 Encounter for screening for other suspected endocrine disorder: Secondary | ICD-10-CM | POA: Diagnosis not present

## 2022-12-12 DIAGNOSIS — Z130 Encounter for screening for diseases of the blood and blood-forming organs and certain disorders involving the immune mechanism: Secondary | ICD-10-CM | POA: Diagnosis not present

## 2022-12-13 ENCOUNTER — Other Ambulatory Visit: Payer: Self-pay

## 2022-12-13 ENCOUNTER — Other Ambulatory Visit: Payer: Self-pay | Admitting: Nurse Practitioner

## 2022-12-13 DIAGNOSIS — Z79899 Other long term (current) drug therapy: Secondary | ICD-10-CM

## 2022-12-13 DIAGNOSIS — E7849 Other hyperlipidemia: Secondary | ICD-10-CM

## 2022-12-13 MED ORDER — ROSUVASTATIN CALCIUM 5 MG PO TABS
5.0000 mg | ORAL_TABLET | Freq: Every day | ORAL | 0 refills | Status: DC
  Filled 2022-12-13: qty 90, 90d supply, fill #0

## 2022-12-20 ENCOUNTER — Other Ambulatory Visit: Payer: Self-pay

## 2022-12-20 ENCOUNTER — Other Ambulatory Visit (HOSPITAL_COMMUNITY): Payer: Self-pay

## 2022-12-20 ENCOUNTER — Other Ambulatory Visit: Payer: Self-pay | Admitting: Nurse Practitioner

## 2022-12-20 MED ORDER — PANTOPRAZOLE SODIUM 40 MG PO TBEC
40.0000 mg | DELAYED_RELEASE_TABLET | Freq: Every day | ORAL | 1 refills | Status: DC
  Filled 2022-12-20: qty 90, 90d supply, fill #0
  Filled 2023-04-04: qty 90, 90d supply, fill #1

## 2023-01-01 ENCOUNTER — Other Ambulatory Visit: Payer: Self-pay

## 2023-01-01 ENCOUNTER — Ambulatory Visit: Payer: Commercial Managed Care - PPO

## 2023-01-01 ENCOUNTER — Other Ambulatory Visit (HOSPITAL_COMMUNITY): Payer: Self-pay

## 2023-01-01 DIAGNOSIS — E119 Type 2 diabetes mellitus without complications: Secondary | ICD-10-CM

## 2023-01-01 LAB — HM DIABETES EYE EXAM

## 2023-01-02 ENCOUNTER — Other Ambulatory Visit: Payer: Self-pay

## 2023-01-03 ENCOUNTER — Other Ambulatory Visit: Payer: Self-pay

## 2023-01-07 ENCOUNTER — Other Ambulatory Visit: Payer: Self-pay

## 2023-01-08 NOTE — Progress Notes (Signed)
We have received your retinal screening report and it shows no diabetic retinopathy.I have printed and routed results to provider.

## 2023-01-23 ENCOUNTER — Encounter (INDEPENDENT_AMBULATORY_CARE_PROVIDER_SITE_OTHER): Payer: Self-pay | Admitting: Otolaryngology

## 2023-01-23 ENCOUNTER — Other Ambulatory Visit: Payer: Self-pay

## 2023-01-23 ENCOUNTER — Ambulatory Visit (INDEPENDENT_AMBULATORY_CARE_PROVIDER_SITE_OTHER): Payer: Commercial Managed Care - PPO | Admitting: Otolaryngology

## 2023-01-23 ENCOUNTER — Other Ambulatory Visit (HOSPITAL_COMMUNITY): Payer: Self-pay

## 2023-01-23 VITALS — BP 102/68 | HR 75 | Ht 68.0 in | Wt 214.0 lb

## 2023-01-23 DIAGNOSIS — R0982 Postnasal drip: Secondary | ICD-10-CM

## 2023-01-23 DIAGNOSIS — J343 Hypertrophy of nasal turbinates: Secondary | ICD-10-CM

## 2023-01-23 DIAGNOSIS — R09A2 Foreign body sensation, throat: Secondary | ICD-10-CM | POA: Diagnosis not present

## 2023-01-23 DIAGNOSIS — J3089 Other allergic rhinitis: Secondary | ICD-10-CM

## 2023-01-23 DIAGNOSIS — R0981 Nasal congestion: Secondary | ICD-10-CM

## 2023-01-23 DIAGNOSIS — R0989 Other specified symptoms and signs involving the circulatory and respiratory systems: Secondary | ICD-10-CM

## 2023-01-23 DIAGNOSIS — K219 Gastro-esophageal reflux disease without esophagitis: Secondary | ICD-10-CM

## 2023-01-23 DIAGNOSIS — J358 Other chronic diseases of tonsils and adenoids: Secondary | ICD-10-CM

## 2023-01-23 DIAGNOSIS — J342 Deviated nasal septum: Secondary | ICD-10-CM | POA: Diagnosis not present

## 2023-01-23 MED ORDER — FLUTICASONE PROPIONATE 50 MCG/ACT NA SUSP
2.0000 | Freq: Every day | NASAL | 6 refills | Status: AC
  Filled 2023-01-23 (×2): qty 16, 30d supply, fill #0

## 2023-01-23 MED ORDER — CETIRIZINE HCL 10 MG PO TABS
10.0000 mg | ORAL_TABLET | Freq: Every day | ORAL | 11 refills | Status: AC
  Filled 2023-01-23 (×2): qty 30, 30d supply, fill #0

## 2023-01-23 NOTE — Patient Instructions (Addendum)
-   start Zyrtec and stop Allegra - start Flonase  - start nasal rinses - continue Protonix - try reflux gourmet   Lloyd Huger Med Nasal Saline Rinse   - start nasal saline rinses with [employer] available over the counter or online to help with nasal congestion     - Take Reflux Gourmet (natural supplement available on Amazon) to help with symptoms of chronic throat irritation

## 2023-01-23 NOTE — Progress Notes (Signed)
ENT CONSULT:  Reason for Consult: tonsillar cyst    HPI: Christina Rodriguez is an 47 y.o. female with hx environmental allergies, on daily Allegra, no prior testing or allergy shots, hx of GERD on PPI, here for left tonsillar lesion and chronic throat clearing.  She reports that she found a lesion on the left side by looking in her mouth, and feels that she has to clear her throat all the time because of the lesion. On Protonix daily for GERD. At times has post-nasal drip, on daily Allegra.  Not on nasal sprays. No prior sinus imaging. No hx of smoking or drinking.   Records Reviewed:  Office visit with [name], NP Presents for routine follow-up for diabetes and hypertension.  Currently on Mounjaro 12.5 mg weekly, denies any adverse effects.  Could not increase to 15 mg at the time because medication was not available.  States her weight has plateaued.  Takes occasional pantoprazole for acid reflux.  Relates this mainly to certain foods such as [place] or [place].  Also takes 2-4 Advil every day for various joint pains.  Sees cardiology once a year.  Regular vision exams, her last one was in February this year.  Sees gynecologist for her regular preventive health physicals.  Has Mirena for birth control. Asthma stable on Trelegy.      Past Medical History:  Diagnosis Date   Adenomyosis    Anemia    HIstory of    Asthma    albuterol inhaler prn-recent uri-using more freq   Diabetes mellitus without complication (HCC)    Dysrhythmia    history of svt-controlled on cardizem-diagnosed in 1999-   Gall bladder disease    GERD (gastroesophageal reflux disease)    uses protonix   Headache    migraines    History of polycystic ovarian disease    Infertility associated with anovulation    Metabolic syndrome    Multiple food allergies    shrimp   Osteoarthritis    Palpitations    PCOS (polycystic ovarian syndrome)    PONV (postoperative nausea and vomiting)    Prediabetes     Recurrent upper respiratory infection (URI)    resolving   SVT (supraventricular tachycardia)    History of    Vitamin D deficiency    VSD (ventricular septal defect)    history of -no problems    Past Surgical History:  Procedure Laterality Date   CESAREAN SECTION  2010   CESAREAN SECTION  11/16/2010   Procedure: CESAREAN SECTION;  Surgeon: Almon Hercules;  Location: [facility];  Service: Gynecology;  Laterality: N/A;  Repeat    CHOLECYSTECTOMY  2003   DILATION AND CURETTAGE OF UTERUS     HYSTEROSCOPY WITH D & C N/A 12/06/2014   Procedure: DILATATION AND CURETTAGE /HYSTEROSCOPY;  Surgeon: Waynard Reeds, MD;  Location: [facility];  Service: Gynecology;  Laterality: N/A;   uterine polyps      Family History  Problem Relation Age of Onset   Diabetes Father    Hypertension Father    Hyperlipidemia Father    Sleep apnea Father    Obesity Father    Colon cancer Brother    Stomach cancer Maternal Grandmother    Colon polyps Neg Hx    Esophageal cancer Neg Hx    Rectal cancer Neg Hx     Social History:  reports that she has never smoked. She has never used smokeless tobacco. She reports that she does not drink alcohol and  does not use drugs.  Allergies:  Allergies  Allergen Reactions   Beta Adrenergic Blockers Shortness Of Breath   Shrimp [Shellfish Allergy] Hives   Soybeans Hives    Medications: I have reviewed the patient's current medications.  The PMH, PSH, Medications, Allergies, and SH were reviewed and updated.  ROS: Constitutional: Negative for fever, weight loss and weight gain. Cardiovascular: Negative for chest pain and dyspnea on exertion. Respiratory: Is not experiencing shortness of breath at rest. Gastrointestinal: Negative for nausea and vomiting. Neurological: Negative for headaches. Psychiatric: The patient is not nervous/anxious  Blood pressure 102/68, pulse 75, height 5\' 8"  (1.727 m), weight 214 lb (97.1 kg), SpO2 97%.  PHYSICAL EXAM:  Exam: General:  Well-developed, well-nourished Communication and Voice: raspy Respiratory Respiratory effort: Equal inspiration and expiration without stridor Cardiovascular Peripheral Vascular: Warm extremities with equal color/perfusion Eyes: No nystagmus with equal extraocular motion bilaterally Neuro/Psych/Balance: Patient oriented to person, place, and time; Appropriate mood and affect; Gait is intact with no imbalance; Cranial nerves I-XII are intact Head and Face Inspection: Normocephalic and atraumatic without mass or lesion Palpation: Facial skeleton intact without bony stepoffs Salivary Glands: No mass or tenderness Facial Strength: Facial motility symmetric and full bilaterally ENT Pinna: External ear intact and fully developed External canal: Canal is patent with intact skin Tympanic Membrane: Clear and mobile External Nose: No scar or anatomic deformity Internal Nose: Septum is deviated to the left. No polyp, or purulence. Mucosal edema and erythema present.  Bilateral inferior turbinate hypertrophy.  Lips, Teeth, and gums: Mucosa and teeth intact and viable TMJ: No pain to palpation with full mobility Oral cavity/oropharynx: No erythema or exudate, no lesions present 2+ cryptic tonsils without exudate Nasopharynx: No mass or lesion with intact mucosa Hypopharynx: Intact mucosa without pooling of secretions Larynx Glottic: Full true vocal cord mobility without lesion or mass Supraglottic: Normal appearing epiglottis and AE folds Interarytenoid Space: moderate pachydermia edema Subglottic Space: Patent without lesion or edema Neck Neck and Trachea: Midline trachea without mass or lesion Thyroid: No mass or nodularity Lymphatics: No lymphadenopathy  Procedure:   PROCEDURE NOTE: nasal endoscopy  Preoperative diagnosis: chronic sinusitis symptoms  Postoperative diagnosis: same  Procedure: Diagnostic nasal endoscopy (62703)  Surgeon: Ashok Croon, M.D.  Anesthesia: Topical  lidocaine and Afrin  H&P REVIEW: The patient's history and physical were reviewed today prior to procedure. All medications were reviewed and updated as well. Complications: None Condition is stable throughout exam Indications and consent: The patient presents with symptoms of chronic sinusitis not responding to previous therapies. All the risks, benefits, and potential complications were reviewed with the patient preoperatively and informed consent was obtained. The time out was completed with confirmation of the correct procedure.   Procedure: The patient was seated upright in the clinic. Topical lidocaine and Afrin were applied to the nasal cavity. After adequate anesthesia had occurred, the rigid nasal endoscope was passed into the nasal cavity. The nasal mucosa, turbinates, septum, and sinus drainage pathways were visualized bilaterally. This revealed minimal purulence but mostly clear secretions in nasal passages and nasopharynx. There were no polyps or sites of significant inflammation. The mucosa was intact and there was no crusting present. The scope was then slowly withdrawn and the patient tolerated the procedure well. There were no complications or blood loss.   Preoperative diagnosis: throat clearing globus sensation  Postoperative diagnosis:   Same  Procedure: Flexible fiberoptic laryngoscopy  Surgeon: Ashok Croon, MD  Anesthesia: Topical lidocaine and Afrin Complications: None Condition is stable throughout  exam  Indications and consent:  The patient presents to the clinic with Indirect laryngoscopy view was incomplete. Thus it was recommended that they undergo a flexible fiberoptic laryngoscopy. All of the risks, benefits, and potential complications were reviewed with the patient preoperatively and verbal informed consent was obtained.  Procedure: The patient was seated upright in the clinic. Topical lidocaine and Afrin were applied to the nasal cavity. After adequate  anesthesia had occurred, I then proceeded to pass the flexible telescope into the nasal cavity. The nasal cavity was patent without rhinorrhea or polyp. The nasopharynx was also patent without mass or lesion. The base of tongue was visualized and was normal. There were no signs of pooling of secretions in the piriform sinuses. The true vocal folds were mobile bilaterally. There were no signs of glottic or supraglottic mucosal lesion or mass. There was moderate interarytenoid pachydermia and post cricoid edema. The telescope was then slowly withdrawn and the patient tolerated the procedure throughout.  Studies Reviewed:no recent imaging   Assessment/Plan: Encounter Diagnoses  Name Primary?   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 Exam with evidence of significant nasal congestion post-nasal drainage and cobblestoning of pharyngeal wall as well as findings c/w GERD/LPR  Chronic throat clearing - I discussed that her tonsillar cyst is not the main reason for throat clearing and I suspect chronic post-nasal drainage and GERD contribute the most GERD LPR - continue Protonix 40 mg daily and try reflux gourmet 3. Nasal congestion post-nasal drainage allergies (also noted to have NSD/ITH on nasal endoscopy today) - nasal saline rinses  - Flonase 2 puffs each naris BID - change from Allegra to Zyrtec 10 mg daily   I discussed that her left tonsillar cyst does not require removal and she will return on as needed basis.   Thank you for allowing me to participate in the care of this patient. Please do not  hesitate to contact me with any questions or concerns.   Ashok Croon, MD Otolaryngology [facility] Phone: [phone] Fax: [phone]    01/23/2023, 2:30 PM

## 2023-01-31 ENCOUNTER — Other Ambulatory Visit (HOSPITAL_COMMUNITY): Payer: Self-pay

## 2023-02-04 ENCOUNTER — Other Ambulatory Visit (HOSPITAL_COMMUNITY): Payer: Self-pay

## 2023-02-21 ENCOUNTER — Other Ambulatory Visit: Payer: Self-pay

## 2023-02-21 ENCOUNTER — Other Ambulatory Visit (HOSPITAL_COMMUNITY): Payer: Self-pay

## 2023-03-04 ENCOUNTER — Other Ambulatory Visit: Payer: Self-pay

## 2023-03-05 ENCOUNTER — Encounter: Payer: Self-pay | Admitting: Family Medicine

## 2023-03-10 ENCOUNTER — Other Ambulatory Visit: Payer: Self-pay

## 2023-03-10 ENCOUNTER — Other Ambulatory Visit: Payer: Self-pay | Admitting: Nurse Practitioner

## 2023-03-10 ENCOUNTER — Other Ambulatory Visit (HOSPITAL_COMMUNITY): Payer: Self-pay

## 2023-03-10 MED ORDER — CELECOXIB 200 MG PO CAPS
200.0000 mg | ORAL_CAPSULE | Freq: Every day | ORAL | 1 refills | Status: DC | PRN
  Filled 2023-03-10: qty 90, 90d supply, fill #0

## 2023-03-10 MED ORDER — ROSUVASTATIN CALCIUM 5 MG PO TABS
5.0000 mg | ORAL_TABLET | Freq: Every day | ORAL | 0 refills | Status: DC
  Filled 2023-03-10: qty 90, 90d supply, fill #0

## 2023-04-04 ENCOUNTER — Other Ambulatory Visit: Payer: Self-pay

## 2023-04-04 ENCOUNTER — Other Ambulatory Visit (HOSPITAL_COMMUNITY): Payer: Self-pay

## 2023-04-07 ENCOUNTER — Other Ambulatory Visit (HOSPITAL_COMMUNITY): Payer: Self-pay

## 2023-04-07 ENCOUNTER — Other Ambulatory Visit: Payer: Self-pay | Admitting: Nurse Practitioner

## 2023-04-07 MED ORDER — MOUNJARO 15 MG/0.5ML ~~LOC~~ SOAJ
15.0000 mg | SUBCUTANEOUS | 0 refills | Status: DC
  Filled 2023-04-07 – 2023-05-01 (×2): qty 6, 84d supply, fill #0

## 2023-04-08 ENCOUNTER — Other Ambulatory Visit (HOSPITAL_COMMUNITY): Payer: Self-pay

## 2023-05-02 ENCOUNTER — Other Ambulatory Visit (HOSPITAL_COMMUNITY): Payer: Self-pay

## 2023-05-13 ENCOUNTER — Other Ambulatory Visit (HOSPITAL_COMMUNITY): Payer: Self-pay

## 2023-05-30 ENCOUNTER — Encounter (HOSPITAL_BASED_OUTPATIENT_CLINIC_OR_DEPARTMENT_OTHER): Payer: Self-pay

## 2023-06-04 ENCOUNTER — Ambulatory Visit (HOSPITAL_BASED_OUTPATIENT_CLINIC_OR_DEPARTMENT_OTHER): Payer: Commercial Managed Care - PPO | Admitting: Cardiology

## 2023-07-04 ENCOUNTER — Other Ambulatory Visit (HOSPITAL_COMMUNITY): Payer: Self-pay

## 2023-07-04 ENCOUNTER — Other Ambulatory Visit: Payer: Self-pay | Admitting: Nurse Practitioner

## 2023-07-04 ENCOUNTER — Other Ambulatory Visit: Payer: Self-pay

## 2023-07-04 ENCOUNTER — Ambulatory Visit (INDEPENDENT_AMBULATORY_CARE_PROVIDER_SITE_OTHER): Payer: Commercial Managed Care - PPO | Admitting: Nurse Practitioner

## 2023-07-04 VITALS — BP 105/72 | Ht 68.0 in | Wt 206.0 lb

## 2023-07-04 DIAGNOSIS — D500 Iron deficiency anemia secondary to blood loss (chronic): Secondary | ICD-10-CM

## 2023-07-04 DIAGNOSIS — E7849 Other hyperlipidemia: Secondary | ICD-10-CM

## 2023-07-04 DIAGNOSIS — R7989 Other specified abnormal findings of blood chemistry: Secondary | ICD-10-CM

## 2023-07-04 DIAGNOSIS — M25541 Pain in joints of right hand: Secondary | ICD-10-CM | POA: Diagnosis not present

## 2023-07-04 DIAGNOSIS — E1169 Type 2 diabetes mellitus with other specified complication: Secondary | ICD-10-CM | POA: Diagnosis not present

## 2023-07-04 DIAGNOSIS — R5383 Other fatigue: Secondary | ICD-10-CM

## 2023-07-04 DIAGNOSIS — M25542 Pain in joints of left hand: Secondary | ICD-10-CM

## 2023-07-04 DIAGNOSIS — Z7984 Long term (current) use of oral hypoglycemic drugs: Secondary | ICD-10-CM

## 2023-07-04 DIAGNOSIS — E119 Type 2 diabetes mellitus without complications: Secondary | ICD-10-CM

## 2023-07-04 DIAGNOSIS — I10 Essential (primary) hypertension: Secondary | ICD-10-CM

## 2023-07-04 DIAGNOSIS — J4520 Mild intermittent asthma, uncomplicated: Secondary | ICD-10-CM | POA: Diagnosis not present

## 2023-07-04 MED ORDER — METFORMIN HCL ER 500 MG PO TB24
1500.0000 mg | ORAL_TABLET | Freq: Every day | ORAL | 1 refills | Status: DC
  Filled 2023-07-04: qty 270, 90d supply, fill #0
  Filled 2023-10-01: qty 270, 90d supply, fill #1

## 2023-07-04 MED ORDER — FUROSEMIDE 80 MG PO TABS
80.0000 mg | ORAL_TABLET | Freq: Every day | ORAL | 1 refills | Status: DC
  Filled 2023-07-04: qty 90, 90d supply, fill #0
  Filled 2023-10-01: qty 90, 90d supply, fill #1

## 2023-07-04 MED ORDER — POTASSIUM CHLORIDE ER 10 MEQ PO TBCR
20.0000 meq | EXTENDED_RELEASE_TABLET | Freq: Every day | ORAL | 1 refills | Status: AC
Start: 2023-07-04 — End: ?
  Filled 2023-07-04: qty 180, 90d supply, fill #0

## 2023-07-04 MED ORDER — PANTOPRAZOLE SODIUM 40 MG PO TBEC
40.0000 mg | DELAYED_RELEASE_TABLET | Freq: Every day | ORAL | 1 refills | Status: DC
  Filled 2023-07-04: qty 90, 90d supply, fill #0
  Filled 2023-10-01: qty 90, 90d supply, fill #1

## 2023-07-04 MED ORDER — CELECOXIB 200 MG PO CAPS
200.0000 mg | ORAL_CAPSULE | Freq: Every day | ORAL | 1 refills | Status: AC | PRN
  Filled 2023-07-04: qty 90, 90d supply, fill #0
  Filled 2023-12-27 – 2024-03-17 (×2): qty 90, 90d supply, fill #1

## 2023-07-04 MED ORDER — ROSUVASTATIN CALCIUM 5 MG PO TABS
5.0000 mg | ORAL_TABLET | Freq: Every day | ORAL | 1 refills | Status: AC
  Filled 2023-07-04: qty 90, 90d supply, fill #0
  Filled 2024-03-17: qty 90, 90d supply, fill #1

## 2023-07-04 MED ORDER — MOUNJARO 15 MG/0.5ML ~~LOC~~ SOAJ
15.0000 mg | SUBCUTANEOUS | 1 refills | Status: DC
  Filled 2023-07-04: qty 6, 84d supply, fill #0
  Filled 2023-10-01: qty 6, 84d supply, fill #1

## 2023-07-05 ENCOUNTER — Encounter: Payer: Self-pay | Admitting: Nurse Practitioner

## 2023-07-05 DIAGNOSIS — M25542 Pain in joints of left hand: Secondary | ICD-10-CM | POA: Insufficient documentation

## 2023-07-05 NOTE — Progress Notes (Signed)
 Subjective:    Patient ID: Christina Rodriguez, female    DOB: 1975/10/30, 48 y.o.   MRN: [id number]  HPI Presents for routine follow-up.  Currently on Mounjaro 15 mg weekly.  Denies constipation or diarrhea.  No nausea or vomiting.  Denies any adverse effects.  Currently on daily pantoprazole.  Overall GERD symptoms are stable, occasionally has breakthrough mainly when she eats certain foods.  Also taking her furosemide and potassium daily which is working well for her edema.  Having some pain in her hand specifically fingers on both sides more on the right.  Takes Celebrex for this as needed for pain.  Gets regular preventive health physicals and mammograms from gynecology.  Is due for routine lab work. Does fairly well with her diet and activity but very busy schedule at this time.  Weight has been stable, fluctuates within a few pounds.  Review of Systems  Respiratory:  Negative for cough, chest tightness, shortness of breath and wheezing.   Cardiovascular:  Negative for chest pain and leg swelling.  Gastrointestinal:  Negative for abdominal pain, constipation, diarrhea, nausea and vomiting.  Musculoskeletal:  Positive for arthralgias.      07/04/2023   12:35 PM  Depression screen PHQ 2/9  Decreased Interest 0  Down, Depressed, Hopeless 0  PHQ - 2 Score 0  Altered sleeping 0  Tired, decreased energy 0  Change in appetite 0  Feeling bad or failure about yourself  0  Trouble concentrating 0  Moving slowly or fidgety/restless 0  Suicidal thoughts 0  PHQ-9 Score 0  Difficult doing work/chores Not difficult at all      07/04/2023   12:36 PM 12/06/2022    1:15 PM 05/12/2022    4:44 PM 05/12/2022    4:22 PM  GAD 7 : Generalized Anxiety Score  Nervous, Anxious, on Edge 0 0  0  Control/stop worrying 0 0  0  Worry too much - different things 0 0  0  Trouble relaxing 0 0  0  Restless 0 0  0  Easily annoyed or irritable 0 0 0 0  Afraid - awful might happen 0 0  0  Total GAD 7 Score 0  0  0  Anxiety Difficulty Not difficult at all  Not difficult at all Not difficult at all         Objective:   Physical Exam NAD.  Alert, oriented.  Lungs clear.  Heart regular rate rhythm.  Abdomen soft nondistended nontender.  Several small nodules noted at the distal joints particularly on the right hand.  No erythema or warmth.  Diabetic Foot Exam - Simple   Simple Foot Form Visual Inspection No deformities, no ulcerations, no other skin breakdown bilaterally: Yes Sensation Testing Intact to touch and monofilament testing bilaterally: Yes Pulse Check Posterior Tibialis and Dorsalis pulse intact bilaterally: Yes Comments    Today's Vitals   07/04/23 1127  BP: 105/72  Weight: 206 lb (93.4 kg)  Height: 5\' 8"  (1.727 m)   Body mass index is 31.32 kg/m.       Assessment & Plan:   Problem List Items Addressed This Visit       Cardiovascular and Mediastinum   Hypertension   Relevant Medications   furosemide (LASIX) 80 MG tablet   rosuvastatin (CRESTOR) 5 MG tablet   Other Relevant Orders   Comprehensive metabolic panel     Respiratory   Asthma, stable     Endocrine   Diabetes mellitus (HCC) - Primary  Relevant Medications   rosuvastatin (CRESTOR) 5 MG tablet   tirzepatide (MOUNJARO) 15 MG/0.5ML Pen   Other Relevant Orders   Comprehensive metabolic panel   Hemoglobin A1c     Other   Anemia   Relevant Orders   CBC with Differential/Platelet   Arthralgia of both hands   Relevant Orders   Sedimentation Rate   Elevated LFTs   Relevant Orders   Comprehensive metabolic panel   Other fatigue   Relevant Orders   CBC with Differential/Platelet   Comprehensive metabolic panel   Other hyperlipidemia   Relevant Medications   furosemide (LASIX) 80 MG tablet   rosuvastatin (CRESTOR) 5 MG tablet   Other Relevant Orders   Lipid panel   Meds ordered this encounter  Medications   celecoxib (CELEBREX) 200 MG capsule    Sig: Take 1 capsule (200 mg total) by  mouth daily as needed for pain    Dispense:  90 capsule    Refill:  1    Supervising Provider:   Lilyan Punt A [[id number]]   furosemide (LASIX) 80 MG tablet    Sig: Take 1 tablet (80 mg total) by mouth daily.    Dispense:  90 tablet    Refill:  1    Supervising Provider:   Lilyan Punt A [[id number]]   pantoprazole (PROTONIX) 40 MG tablet    Sig: Take 1 tablet (40 mg total) by mouth daily.    Dispense:  90 tablet    Refill:  1    Supervising Provider:   Lilyan Punt A [[id number]]   potassium chloride (KLOR-CON) 10 MEQ tablet    Sig: Take 2 tablets (20 mEq total) by mouth daily.    Dispense:  180 tablet    Refill:  1    Supervising Provider:   Lilyan Punt A [[id number]]   rosuvastatin (CRESTOR) 5 MG tablet    Sig: Take 1 tablet (5 mg total) by mouth daily.    Dispense:  90 tablet    Refill:  1    Supervising Provider:   Lilyan Punt A [[id number]]   tirzepatide (MOUNJARO) 15 MG/0.5ML Pen    Sig: Inject 15 mg into the skin once a week.    Dispense:  6 mL    Refill:  1    DIagnosis: E11.9;  Currently on Metformin daily    Supervising Provider:   Lilyan Punt A [[id number]]   Labs pending.  Sed rate added as a precaution.  Further workup may be needed if symptoms worsen.  At this time the arthralgias appear to be early osteoarthritis.  Continue Celebrex as directed as needed. Continue other medications as directed. Return in about 6 months (around 01/01/2024).

## 2023-07-07 ENCOUNTER — Other Ambulatory Visit: Payer: Self-pay | Admitting: Obstetrics and Gynecology

## 2023-07-07 DIAGNOSIS — Z1231 Encounter for screening mammogram for malignant neoplasm of breast: Secondary | ICD-10-CM

## 2023-07-16 DIAGNOSIS — M25542 Pain in joints of left hand: Secondary | ICD-10-CM | POA: Diagnosis not present

## 2023-07-16 DIAGNOSIS — E119 Type 2 diabetes mellitus without complications: Secondary | ICD-10-CM | POA: Diagnosis not present

## 2023-07-16 DIAGNOSIS — E7849 Other hyperlipidemia: Secondary | ICD-10-CM | POA: Diagnosis not present

## 2023-07-16 DIAGNOSIS — D500 Iron deficiency anemia secondary to blood loss (chronic): Secondary | ICD-10-CM | POA: Diagnosis not present

## 2023-07-16 DIAGNOSIS — R5383 Other fatigue: Secondary | ICD-10-CM | POA: Diagnosis not present

## 2023-07-16 DIAGNOSIS — I10 Essential (primary) hypertension: Secondary | ICD-10-CM | POA: Diagnosis not present

## 2023-07-16 DIAGNOSIS — R7989 Other specified abnormal findings of blood chemistry: Secondary | ICD-10-CM | POA: Diagnosis not present

## 2023-07-16 DIAGNOSIS — M25541 Pain in joints of right hand: Secondary | ICD-10-CM | POA: Diagnosis not present

## 2023-07-17 ENCOUNTER — Encounter: Payer: Self-pay | Admitting: Nurse Practitioner

## 2023-07-17 LAB — COMPREHENSIVE METABOLIC PANEL
ALT: 13 IU/L (ref 0–32)
AST: 16 IU/L (ref 0–40)
Albumin: 4.3 g/dL (ref 3.9–4.9)
Alkaline Phosphatase: 89 IU/L (ref 44–121)
BUN/Creatinine Ratio: 20 (ref 9–23)
BUN: 14 mg/dL (ref 6–24)
Bilirubin Total: 0.5 mg/dL (ref 0.0–1.2)
CO2: 21 mmol/L (ref 20–29)
Calcium: 9.5 mg/dL (ref 8.7–10.2)
Chloride: 102 mmol/L (ref 96–106)
Creatinine, Ser: 0.7 mg/dL (ref 0.57–1.00)
Globulin, Total: 3.3 g/dL (ref 1.5–4.5)
Glucose: 81 mg/dL (ref 70–99)
Potassium: 4.4 mmol/L (ref 3.5–5.2)
Sodium: 140 mmol/L (ref 134–144)
Total Protein: 7.6 g/dL (ref 6.0–8.5)
eGFR: 107 mL/min/{1.73_m2} (ref >59)

## 2023-07-17 LAB — CBC WITH DIFFERENTIAL/PLATELET
Basophils Absolute: 0 10*3/uL (ref 0.0–0.2)
Basos: 1 %
EOS (ABSOLUTE): 0.2 10*3/uL (ref 0.0–0.4)
Eos: 3 %
Hematocrit: 38.6 % (ref 34.0–46.6)
Hemoglobin: 12.6 g/dL (ref 11.1–15.9)
Immature Grans (Abs): 0 10*3/uL (ref 0.0–0.1)
Immature Granulocytes: 0 %
Lymphocytes Absolute: 2.1 10*3/uL (ref 0.7–3.1)
Lymphs: 31 %
MCH: 28.7 pg (ref 26.6–33.0)
MCHC: 32.6 g/dL (ref 31.5–35.7)
MCV: 88 fL (ref 79–97)
Monocytes Absolute: 0.6 10*3/uL (ref 0.1–0.9)
Monocytes: 8 %
Neutrophils Absolute: 3.7 10*3/uL (ref 1.4–7.0)
Neutrophils: 57 %
Platelets: 354 10*3/uL (ref 150–450)
RBC: 4.39 x10E6/uL (ref 3.77–5.28)
RDW: 13.6 % (ref 11.7–15.4)
WBC: 6.6 10*3/uL (ref 3.4–10.8)

## 2023-07-17 LAB — LIPID PANEL
Chol/HDL Ratio: 3 ratio (ref 0.0–4.4)
Cholesterol, Total: 125 mg/dL (ref 100–199)
HDL: 41 mg/dL (ref >39)
LDL Chol Calc (NIH): 69 mg/dL (ref 0–99)
Triglycerides: 71 mg/dL (ref 0–149)
VLDL Cholesterol Cal: 15 mg/dL (ref 5–40)

## 2023-07-17 LAB — SEDIMENTATION RATE: Sed Rate: 21 mm/h (ref 0–32)

## 2023-07-17 LAB — HEMOGLOBIN A1C
Est. average glucose Bld gHb Est-mCnc: 103 mg/dL
Hgb A1c MFr Bld: 5.2 % (ref 4.8–5.6)

## 2023-09-19 ENCOUNTER — Encounter (HOSPITAL_BASED_OUTPATIENT_CLINIC_OR_DEPARTMENT_OTHER): Payer: Self-pay | Admitting: Cardiology

## 2023-09-19 ENCOUNTER — Ambulatory Visit (HOSPITAL_BASED_OUTPATIENT_CLINIC_OR_DEPARTMENT_OTHER): Payer: Commercial Managed Care - PPO | Admitting: Cardiology

## 2023-09-19 VITALS — BP 110/66 | HR 99 | Ht 68.0 in | Wt 208.3 lb

## 2023-09-19 DIAGNOSIS — Z7189 Other specified counseling: Secondary | ICD-10-CM | POA: Diagnosis not present

## 2023-09-19 DIAGNOSIS — I471 Supraventricular tachycardia, unspecified: Secondary | ICD-10-CM | POA: Diagnosis not present

## 2023-09-19 DIAGNOSIS — R600 Localized edema: Secondary | ICD-10-CM

## 2023-09-19 DIAGNOSIS — E7800 Pure hypercholesterolemia, unspecified: Secondary | ICD-10-CM

## 2023-09-19 DIAGNOSIS — Z8774 Personal history of (corrected) congenital malformations of heart and circulatory system: Secondary | ICD-10-CM

## 2023-09-19 NOTE — Progress Notes (Signed)
  Cardiology Office Note:  .   Date:  09/19/2023  ID:  Christina Rodriguez, DOB 1976-03-31, MRN [id number] PCP: Bennet Brasil, MD  [facility] Providers Cardiologist:  Sheryle Donning, MD {  History of Present Illness: .   Christina Rodriguez is a 48 y.o. female with PMH paroxysmal SVT, VSD s/p repair, bilateral LE edema  Pertinent CV history: monitor and echo from 2022 unremarkable.  Today: Overall doing well. Has occasional mild palpitations, nonlimiting, feels this is well managed. No shortness of breath. No LE edema unless she skips a dose of lasix  (only rarely when she is traveling).   Doing well on mounjaro , minimal side effects. Reviewed lipids, labs. Breathing is stable, no significant shortness of breath.   ROS: Denies chest pain, shortness of breath at rest or with normal exertion. No PND, orthopnea, LE edema or unexpected weight gain. No syncope or palpitations. ROS otherwise negative except as noted.   Studies Reviewed: Aaron Aas    EKG:  EKG Interpretation Date/Time:  Friday Sep 19 2023 14:06:13 EDT Ventricular Rate:  82 PR Interval:  144 QRS Duration:  82 QT Interval:  376 QTC Calculation: 439 R Axis:   33  Text Interpretation: Normal sinus rhythm Normal ECG When compared with ECG of 09-Nov-2010 12:58, No significant change was found Confirmed by Sheryle Donning [phone]) on 09/19/2023 2:31:39 PM    Physical Exam:   VS:  BP 110/66   Pulse 99   Ht 5\' 8"  (1.727 m)   Wt 208 lb 4.8 oz (94.5 kg)   SpO2 98%   BMI 31.67 kg/m    Wt Readings from Last 3 Encounters:  09/19/23 208 lb 4.8 oz (94.5 kg)  07/04/23 206 lb (93.4 kg)  01/23/23 214 lb (97.1 kg)    GEN: Well nourished, well developed in no acute distress HEENT: Normal, moist mucous membranes NECK: No JVD CARDIAC: regular rhythm, normal S1 and S2, no rubs or gallops. No murmur. VASCULAR: Radial and DP pulses 2+ bilaterally. No carotid bruits RESPIRATORY:  Clear to auscultation without rales,  wheezing or rhonchi  ABDOMEN: Soft, non-tender, non-distended MUSCULOSKELETAL:  Ambulates independently SKIN: Warm and dry, no edema NEUROLOGIC:  Alert and oriented x 3. No focal neuro deficits noted. PSYCHIATRIC:  Normal affect    ASSESSMENT AND PLAN: .    Bilateral LE edema, intermittent History of VSD s/p repair -better on lasix ; HCTZ and torsemide  were not as helpful -no residual VSD on echo   Palpitations, history of pSVT -monitor, echo reassuring -symptoms improved with use of lasix  -continue diltiazem   Hypercholesterolemia -now on 5 mg rosuvastatin  with excellent improvement in her lipids (124 to 69)  Risk of metabolic syndrome: now on metformin  and GLP, with PMH obesity, abnormal glucose handling, PCOS, history of elevated LFTs  CV risk counseling and prevention -recommend heart healthy/Mediterranean diet, with whole grains, fruits, vegetable, fish, lean meats, nuts, and olive oil. Limit salt. -recommend moderate walking, 3-5 times/week for 30-50 minutes each session. Aim for at least 150 minutes.week. Goal should be pace of 3 miles/hours, or walking 1.5 miles in 30 minutes -recommend avoidance of tobacco products. Avoid excess alcohol.  Dispo: 1 year or sooner as needed  Signed, Sheryle Donning, MD   Sheryle Donning, MD, PhD, [facility]  [facility]  [place]  [facility] at [facility] [address] [place], [place] [address] [phone]

## 2023-09-19 NOTE — Patient Instructions (Signed)
 Medication Instructions:  Your physician recommends that you continue on your current medications as directed. Please refer to the Current Medication list given to you today.   Follow-Up: Please follow up in 1 year with Dr. Veryl Gottron, Slater Duncan, NP or Neomi Banks, NP

## 2023-10-01 ENCOUNTER — Other Ambulatory Visit (HOSPITAL_COMMUNITY): Payer: Self-pay

## 2023-10-02 ENCOUNTER — Other Ambulatory Visit (HOSPITAL_COMMUNITY): Payer: Self-pay

## 2023-10-02 ENCOUNTER — Other Ambulatory Visit (HOSPITAL_BASED_OUTPATIENT_CLINIC_OR_DEPARTMENT_OTHER): Payer: Self-pay

## 2023-10-03 ENCOUNTER — Ambulatory Visit

## 2023-11-03 ENCOUNTER — Ambulatory Visit
Admission: RE | Admit: 2023-11-03 | Discharge: 2023-11-03 | Disposition: A | Discharge status: Home / Self Care | Source: Ambulatory Visit | Attending: Obstetrics and Gynecology | Admitting: Obstetrics and Gynecology

## 2023-11-03 ENCOUNTER — Ambulatory Visit (INDEPENDENT_AMBULATORY_CARE_PROVIDER_SITE_OTHER): Payer: Commercial Managed Care - PPO | Admitting: Obstetrics and Gynecology

## 2023-11-03 ENCOUNTER — Encounter: Payer: Self-pay | Admitting: Obstetrics and Gynecology

## 2023-11-03 VITALS — BP 132/86 | HR 91 | Ht 68.75 in | Wt 208.0 lb

## 2023-11-03 DIAGNOSIS — Z308 Encounter for other contraceptive management: Secondary | ICD-10-CM

## 2023-11-03 DIAGNOSIS — Z1331 Encounter for screening for depression: Secondary | ICD-10-CM | POA: Diagnosis not present

## 2023-11-03 DIAGNOSIS — Z01419 Encounter for gynecological examination (general) (routine) without abnormal findings: Secondary | ICD-10-CM | POA: Diagnosis not present

## 2023-11-03 DIAGNOSIS — Z1231 Encounter for screening mammogram for malignant neoplasm of breast: Secondary | ICD-10-CM | POA: Diagnosis not present

## 2023-11-03 DIAGNOSIS — Z9189 Other specified personal risk factors, not elsewhere classified: Secondary | ICD-10-CM

## 2023-11-03 NOTE — Patient Instructions (Signed)

## 2023-11-03 NOTE — Progress Notes (Unsigned)
 48 y.o. G29P1002 Married Caucasian female here for annual exam.   Returning patient of mine.   Has Mirena  IUD, placed in 2017.   Menstruation has returned.  Would like a new Mirena  IUD.   Has increased risk of breast cancer and does periodic breast MRI.  English as a second language teacher for [employer].   48 yo and 48 yo daughters.   PCP: Alphonsa Glendia LABOR, MD   Patient's last menstrual period was 10/27/2023 (approximate).     Period Cycle (Days): 28 Period Duration (Days): 3-4 Period Pattern: Regular Menstrual Flow: Light Menstrual Control: Maxi pad Dysmenorrhea: (!) Mild Dysmenorrhea Symptoms: Cramping, Headache     Sexually active: Yes.    The current method of family planning is Mirena  IUD, 03/06/2016.     Menopausal hormone therapy:  n/a Exercising: Yes.    Walking Smoker:  no  OB History  Gravida Para Term Preterm AB Living  2 1 1   2   SAB IAB Ectopic Multiple Live Births      2    # Outcome Date GA Lbr Len/2nd Weight Sex Type Anes PTL Lv  2 Term 2010 [redacted]w[redacted]d    CS-LTranv EPI       Birth Comments: macrosomia  1 Gravida              Birth Comments: System Generated. Please review and update pregnancy details.     HEALTH MAINTENANCE: Last 2 paps:  10/25/21, neg HR HPV neg- per pt, 01/24/16 neg, 01/19/15 neg History of abnormal Pap or positive HPV:  no Mammogram:   2024 per pt, scheduled for one today.  [facility].  Colonoscopy:  03/12/21 -  due in 2032.  Bone Density:  n/a  Result  n/a   Immunization History  Administered Date(s) Administered   Influenza, Quadrivalent, Recombinant, Inj, Pf 02/15/2021, 02/19/2022   Influenza-Unspecified 02/04/2020   Tdap 08/25/2019      reports that she has never smoked. She has never used smokeless tobacco. She reports that she does not drink alcohol and does not use drugs.  Past Medical History:  Diagnosis Date   Adenomyosis    Adenomyosis    Anemia    HIstory of    Asthma    albuterol  inhaler prn-recent  uri-using more freq   At high risk for breast cancer    Diabetes mellitus without complication (HCC)    Dysrhythmia    history of svt-controlled on cardizem -diagnosed in 1999-   Fibroid    Gall bladder disease    GERD (gastroesophageal reflux disease)    uses protonix    Headache    migraines    History of polycystic ovarian disease    Infertility associated with anovulation    Metabolic syndrome    Multiple food allergies    shrimp   Osteoarthritis    Palpitations    PCOS (polycystic ovarian syndrome)    PONV (postoperative nausea and vomiting)    Prediabetes    Recurrent upper respiratory infection (URI)    resolving   SVT (supraventricular tachycardia) (HCC)    History of    Vitamin D  deficiency    VSD (ventricular septal defect)    history of -no problems    Past Surgical History:  Procedure Laterality Date   CESAREAN SECTION  2010   CESAREAN SECTION  11/16/2010   Procedure: CESAREAN SECTION;  Surgeon: Marjorie HILARIO Gull;  Location: [facility];  Service: Gynecology;  Laterality: N/A;  Repeat    CHOLECYSTECTOMY  2003  DILATION AND CURETTAGE OF UTERUS     HYSTEROSCOPY WITH D & C N/A 12/06/2014   Procedure: DILATATION AND CURETTAGE /HYSTEROSCOPY;  Surgeon: Marjorie Gull, MD;  Location: [facility];  Service: Gynecology;  Laterality: N/A;   uterine polyps      Current Outpatient Medications  Medication Sig Dispense Refill   albuterol  (VENTOLIN  HFA) 108 (90 Base) MCG/ACT inhaler Inhale 2 puffs into the lungs every 4 (four) hours as needed for wheezing or shortness of breath. 6.7 g 5   celecoxib  (CELEBREX ) 200 MG capsule Take 1 capsule (200 mg total) by mouth daily as needed for pain 90 capsule 1   cetirizine  (ZYRTEC ) 10 MG tablet Take 1 tablet (10 mg total) by mouth daily. 30 tablet 11   Continuous Blood Gluc Receiver (FREESTYLE LIBRE READER) DEVI Use reader as directed with sensors 1 each 0   Continuous Blood Gluc Sensor (FREESTYLE LIBRE 14 DAY SENSOR) MISC Apply as directed every 14  days 2 each 11   cyanocobalamin 500 MCG tablet Take 500 mcg by mouth daily.     diltiazem  (CARDIZEM  CD) 240 MG 24 hr capsule Take 1 capsule (240 mg total) by mouth daily. 90 capsule 1   EPINEPHrine 0.3 mg/0.3 mL IJ SOAJ injection Inject 0.3 mg into the muscle once. Reported on 05/03/2015     fluticasone  (FLONASE ) 50 MCG/ACT nasal spray Place 2 sprays into both nostrils daily. 16 g 6   Fluticasone -Umeclidin-Vilant (TRELEGY ELLIPTA ) 100-62.5-25 MCG/ACT AEPB Inhale 1 puff into the lungs daily. 60 each 5   furosemide  (LASIX ) 80 MG tablet Take 1 tablet (80 mg total) by mouth daily. 90 tablet 1   levonorgestrel  (MIRENA , 52 MG,) 20 MCG/DAY IUD Mirena  20 mcg/24 hours (8 yrs) 52 mg intrauterine device  Take 1 device by intrauterine route.     metFORMIN  (GLUCOPHAGE -XR) 500 MG 24 hr tablet Take 3 tablets (1,500 mg total) by mouth daily with breakfast. 270 tablet 1   Multiple Vitamins-Minerals (MULTIVITAMIN ADULT PO) Take 1 tablet by mouth daily.     ondansetron  (ZOFRAN -ODT) 8 MG disintegrating tablet Take 1 tablet (8 mg total) by mouth every 8 (eight) hours as needed for nausea or vomiting. 20 tablet 0   pantoprazole  (PROTONIX ) 40 MG tablet Take 1 tablet (40 mg total) by mouth daily. 90 tablet 1   potassium chloride  (KLOR-CON ) 10 MEQ tablet Take 2 tablets (20 mEq total) by mouth daily. 180 tablet 1   rosuvastatin  (CRESTOR ) 5 MG tablet Take 1 tablet (5 mg total) by mouth daily. 90 tablet 1   tirzepatide  (MOUNJARO ) 15 MG/0.5ML Pen Inject 15 mg into the skin once a week. 6 mL 1   triamcinolone  cream (KENALOG ) 0.1 % Apply to affected area 2 times a day as needed for rash, use up to 2 weeks in one area 30 g 0   No current facility-administered medications for this visit.    ALLERGIES: Beta adrenergic blockers, Metoprolol, Shrimp [shellfish allergy], and Soybeans  Family History  Problem Relation Age of Onset   Diabetes Father    Hypertension Father    Hyperlipidemia Father    Sleep apnea Father     Obesity Father    Colon polyps Maternal Aunt    Breast cancer Maternal Grandmother    Stomach cancer Maternal Grandmother    Esophageal cancer Neg Hx    Rectal cancer Neg Hx     Review of Systems  All other systems reviewed and are negative.   PHYSICAL EXAM:  BP 132/86 (BP Location: Left  Arm, Patient Position: Sitting)   Pulse 91   Ht 5' 8.75 (1.746 m)   Wt 208 lb (94.3 kg)   LMP 10/27/2023 (Approximate)   SpO2 98%   BMI 30.94 kg/m     General appearance: alert, cooperative and appears stated age Head: normocephalic, without obvious abnormality, atraumatic Neck: no adenopathy, supple, symmetrical, trachea midline and thyroid  normal to inspection and palpation Lungs: clear to auscultation bilaterally Breasts: normal appearance, no masses or tenderness, No nipple retraction or dimpling, No nipple discharge or bleeding, No axillary adenopathy Heart: regular rate and rhythm Abdomen: soft, non-tender; no masses, no organomegaly Extremities: extremities normal, atraumatic, no cyanosis or edema Skin: skin color, texture, turgor normal. No rashes or lesions Lymph nodes: cervical, supraclavicular, and axillary nodes normal. Neurologic: grossly normal  Pelvic: External genitalia:  no lesions              No abnormal inguinal nodes palpated.              Urethra:  normal appearing urethra with no masses, tenderness or lesions              Bartholins and Skenes: normal                 Vagina: normal appearing vagina with normal color and discharge, no lesions              Cervix: no lesions.  IUD strings noted.               Pap taken: no Bimanual Exam:  Uterus:  normal size, contour, position, consistency, mobility, non-tender              Adnexa: no mass, fullness, tenderness              Rectal exam: yes.  Confirms.              Anus:  normal sphincter tone, no lesions  Chaperone was present for exam:  Jada M.   ASSESSMENT: Well woman visit with gynecologic exam. Adenomyosis.   Mirena  IUD.  Increased risk of Breast CA, 23%.  Documentation on mammogram from [facility].  PHQ-2-9: 0  PLAN: Mammogram screening discussed. Anticipate yearly breast MRI, due at end of Dec or begin of Jan.  Order placed.  Self breast awareness reviewed. Pap and HRV collected:  no, likely due in 2028.  Guidelines for Calcium , Vitamin D , regular exercise program including cardiovascular and weight bearing exercise. Medication refills:  NA Labs with PCP. Return for Mirena  IUD exchange. Will get a copy of medical records from prior OB/GYN office since 2019.  Follow up:  yearly and prn.

## 2023-11-04 ENCOUNTER — Telehealth: Payer: Self-pay | Admitting: Obstetrics and Gynecology

## 2023-11-04 NOTE — Telephone Encounter (Signed)
 Order placed for breast MRI with and without contrast for end of December, 2025 or beginning of January, 2026.  Dx:  increased risk of breast cancer, 23% lifetime risk.

## 2023-11-05 NOTE — Telephone Encounter (Addendum)
 Patient aware order is placed. Patient aware to call to make an appointment soon for December/January if she does not hear from them.

## 2023-11-06 ENCOUNTER — Ambulatory Visit: Payer: Self-pay | Admitting: Obstetrics and Gynecology

## 2023-11-12 NOTE — Telephone Encounter (Signed)
 Per review of EPIC, patient is scheduled for breast MRI on 05/01/24.

## 2023-12-08 NOTE — Progress Notes (Signed)
 GYNECOLOGY  VISIT   HPI: 48 y.o.   Married  Caucasian  female   G2P1002 with No LMP recorded. (Menstrual status: IUD).   here for   Mirena  IUD exchange.  UPT negative.   GYNECOLOGIC HISTORY: No LMP recorded. (Menstrual status: IUD). Contraception:  IUD,  Mirena  placed 03/06/2016.  Menopausal hormone therapy:  n/a Last mammogram:  11/03/23 density B Bi-rads 1 neg  Last pap smear:   10/25/21, neg HR HPV neg- per pt         OB History     Gravida  2   Para  1   Term  1   Preterm      AB      Living  2      SAB      IAB      Ectopic      Multiple      Live Births  2              Patient Active Problem List   Diagnosis Date Noted   S/P VSD repair 09/19/2023   Arthralgia of both hands 07/05/2023   Dyshidrotic eczema 04/13/2021   Other hyperlipidemia 06/29/2020   SVT (supraventricular tachycardia) (HCC) 02/28/2020   Diabetes mellitus (HCC) 02/28/2020   Hypertension 12/02/2019   Elevated LFTs 04/23/2017   Class 2 severe obesity with serious comorbidity and body mass index (BMI) of 39.0 to 39.9 in adult (HCC) 12/26/2016   Shortness of breath on exertion 12/26/2016   Other fatigue 12/26/2016   Metabolic syndrome 11/20/2016   Hyperinsulinemia 11/20/2016   PCOS (polycystic ovarian syndrome) 05/05/2016   Morbid obesity (HCC) 05/05/2016   Tachycardia 05/05/2016   Peripheral edema 05/05/2016   Anemia 01/23/2015   Vitamin D  deficiency 01/23/2015   Plantar fasciitis of right foot 09/17/2013   Migraine headache with aura 09/03/2013   Asthma, stable 07/19/2013    Past Medical History:  Diagnosis Date   Adenomyosis    Adenomyosis    Anemia    HIstory of    Asthma    albuterol  inhaler prn-recent uri-using more freq   At high risk for breast cancer    Diabetes mellitus without complication (HCC)    Dysrhythmia    history of svt-controlled on cardizem -diagnosed in 1999-   Fibroid    Gall bladder disease    GERD (gastroesophageal reflux disease)    uses  protonix    Headache    migraines    History of polycystic ovarian disease    Infertility associated with anovulation    Metabolic syndrome    Multiple food allergies    shrimp   Osteoarthritis    Palpitations    PCOS (polycystic ovarian syndrome)    PONV (postoperative nausea and vomiting)    Prediabetes    Recurrent upper respiratory infection (URI)    resolving   SVT (supraventricular tachycardia) (HCC)    History of    Vitamin D  deficiency    VSD (ventricular septal defect)    history of -no problems    Past Surgical History:  Procedure Laterality Date   CESAREAN SECTION  2010   CESAREAN SECTION  11/16/2010   Procedure: CESAREAN SECTION;  Surgeon: Marjorie HILARIO Gull;  Location: [facility];  Service: Gynecology;  Laterality: N/A;  Repeat    CHOLECYSTECTOMY  2003   DILATION AND CURETTAGE OF UTERUS     HYSTEROSCOPY WITH D & C N/A 12/06/2014   Procedure: DILATATION AND CURETTAGE /HYSTEROSCOPY;  Surgeon: Marjorie Gull, MD;  Location:  [facility];  Service: Gynecology;  Laterality: N/A;   uterine polyps      Current Outpatient Medications  Medication Sig Dispense Refill   albuterol  (VENTOLIN  HFA) 108 (90 Base) MCG/ACT inhaler Inhale 2 puffs into the lungs every 4 (four) hours as needed for wheezing or shortness of breath. 6.7 g 5   celecoxib  (CELEBREX ) 200 MG capsule Take 1 capsule (200 mg total) by mouth daily as needed for pain 90 capsule 1   cetirizine  (ZYRTEC ) 10 MG tablet Take 1 tablet (10 mg total) by mouth daily. 30 tablet 11   Continuous Blood Gluc Receiver (FREESTYLE LIBRE READER) DEVI Use reader as directed with sensors 1 each 0   Continuous Blood Gluc Sensor (FREESTYLE LIBRE 14 DAY SENSOR) MISC Apply as directed every 14 days 2 each 11   cyanocobalamin 500 MCG tablet Take 500 mcg by mouth daily.     diltiazem  (CARDIZEM  CD) 240 MG 24 hr capsule Take 1 capsule (240 mg total) by mouth daily. 90 capsule 1   EPINEPHrine 0.3 mg/0.3 mL IJ SOAJ injection Inject 0.3 mg into the muscle once.  Reported on 05/03/2015     Fluticasone -Umeclidin-Vilant (TRELEGY ELLIPTA ) 100-62.5-25 MCG/ACT AEPB Inhale 1 puff into the lungs daily. 60 each 5   furosemide  (LASIX ) 80 MG tablet Take 1 tablet (80 mg total) by mouth daily. 90 tablet 1   levonorgestrel  (MIRENA , 52 MG,) 20 MCG/DAY IUD Mirena  20 mcg/24 hours (8 yrs) 52 mg intrauterine device  Take 1 device by intrauterine route.     metFORMIN  (GLUCOPHAGE -XR) 500 MG 24 hr tablet Take 3 tablets (1,500 mg total) by mouth daily with breakfast. 270 tablet 1   Multiple Vitamins-Minerals (MULTIVITAMIN ADULT PO) Take 1 tablet by mouth daily.     ondansetron  (ZOFRAN -ODT) 8 MG disintegrating tablet Take 1 tablet (8 mg total) by mouth every 8 (eight) hours as needed for nausea or vomiting. 20 tablet 0   pantoprazole  (PROTONIX ) 40 MG tablet Take 1 tablet (40 mg total) by mouth daily. 90 tablet 1   potassium chloride  (KLOR-CON ) 10 MEQ tablet Take 2 tablets (20 mEq total) by mouth daily. 180 tablet 1   rosuvastatin  (CRESTOR ) 5 MG tablet Take 1 tablet (5 mg total) by mouth daily. 90 tablet 1   tirzepatide  (MOUNJARO ) 15 MG/0.5ML Pen Inject 15 mg into the skin once a week. 6 mL 1   triamcinolone  cream (KENALOG ) 0.1 % Apply to affected area 2 times a day as needed for rash, use up to 2 weeks in one area 30 g 0   fluticasone  (FLONASE ) 50 MCG/ACT nasal spray Place 2 sprays into both nostrils daily. (Patient not taking: Reported on 12/15/2023) 16 g 6   No current facility-administered medications for this visit.     ALLERGIES: Beta adrenergic blockers, Metoprolol, Shrimp [shellfish allergy], and Soybeans  Family History  Problem Relation Age of Onset   Diabetes Father    Hypertension Father    Hyperlipidemia Father    Sleep apnea Father    Obesity Father    Colon polyps Maternal Aunt    Breast cancer Maternal Grandmother    Stomach cancer Maternal Grandmother    Esophageal cancer Neg Hx    Rectal cancer Neg Hx     Social History   Socioeconomic History    Marital status: Married    Spouse name: Penne   Number of children: 2   Years of education: Not on file   Highest education level: Not on file  Occupational History  Occupation: [name], Psychologist, forensic:   Tobacco Use   Smoking status: Never   Smokeless tobacco: Never  Substance and Sexual Activity   Alcohol use: No   Drug use: No   Sexual activity: Yes    Partners: Male    Birth control/protection: I.U.D.    Comment: Mirena  - inserted in 03/07/2016  Other Topics Concern   Not on file  Social History Narrative   Not on file   Social Drivers of Health   Financial Resource Strain: Not on file  Food Insecurity: Not on file  Transportation Needs: Not on file  Physical Activity: Not on file  Stress: Not on file  Social Connections: Not on file  Intimate Partner Violence: Not on file    Review of Systems  All other systems reviewed and are negative.   PHYSICAL EXAMINATION:    BP 116/76   Pulse 71   Wt 208 lb (94.3 kg)   SpO2 100%   BMI 30.94 kg/m     General appearance: alert, cooperative and appears stated age  Procedure:  Mirena  IUD removal and reinsertion.  New Mirena  IUD:  Lot [id number], exp May 2027. Consent and time out done.  Sterile prep with Hibiclens.  Paracervical block with 10 cc 1% lidocaine :  lot [id number] , exp Sept 2026. IUD strings grasped, IUD removed intact, shown to patient, and discarded. Tenaculum to anterior cervical lip.  Os finder used to dilate cervical os.  Uterus sounded to 8 cm.  Mirena  IUD placed without difficulty. Strings trimmed.  Repeat bimanual exam, no change.  No complications.  Minimal EBL.   Chaperone was present for exam:  Heinz HERO, CMA  ASSESSMENT  Mirena  IUD exchange.   PLAN  Back up protection for one week.  New IUD card to patient.  FU for IUD check up in 4 weeks.

## 2023-12-15 ENCOUNTER — Ambulatory Visit (INDEPENDENT_AMBULATORY_CARE_PROVIDER_SITE_OTHER): Admitting: Obstetrics and Gynecology

## 2023-12-15 VITALS — BP 116/76 | HR 71 | Wt 208.0 lb

## 2023-12-15 DIAGNOSIS — Z01812 Encounter for preprocedural laboratory examination: Secondary | ICD-10-CM | POA: Diagnosis not present

## 2023-12-15 DIAGNOSIS — Z30433 Encounter for removal and reinsertion of intrauterine contraceptive device: Secondary | ICD-10-CM | POA: Diagnosis not present

## 2023-12-15 DIAGNOSIS — Z308 Encounter for other contraceptive management: Secondary | ICD-10-CM

## 2023-12-15 LAB — PREGNANCY, URINE: Preg Test, Ur: NEGATIVE

## 2023-12-15 NOTE — Patient Instructions (Signed)
 Intrauterine Device (IUD) Insertion: What to Expect  An intrauterine device (IUD) is put in (inserted) your uterus to prevent pregnancy. It's a small, T-shaped device that has one or two nylon strings hanging down from it. The strings hang out of your cervix, which is the lowest part of your uterus. Tell a health care provider about: Any allergies you have. All medicines you take. These include vitamins, herbs, eye drops, and creams. Any surgeries you have had. Any medical problems you have. Whether you're pregnant or may be pregnant. What are the risks? Your health care provider will talk with you about risks. These may include: Infection. Bleeding. Allergic reactions to medicines. A cut to the uterus, also called perforation, or damage to other structures or organs. Accidental placement of the IUD either in the muscle layer of the uterus or outside the uterus. The IUD falling out of the uterus. This is more common if you recently had a baby. Higher risk of ectopic pregnancy. This is when an egg is fertilized outside your uterus. This is rare. Pelvic inflammatory disease (PID). This is an infection in the uterus and fallopian tubes. The IUD doesn't cause the infection. The infection is usually from a sexually transmitted infection (STI). If this happens, it is usually during the first 20 days after the IUD is put in. This is rare. What happens before? Ask about changing or stopping: Any medicines you take. Any vitamins, herbs, or supplements you take. Your provider may tell you to take pain medicines you can buy at the store before the procedure. You may have tests for: Pregnancy. You may have a pee (urine) or blood sample taken. STIs. Placing an IUD can make an infection worse. To check for cervical cancer. You may have a Pap test, which is when cells from your cervix are removed for testing. What happens during an IUD insertion? A tool, called a speculum, will be placed in your  vagina and widened so that your provider can see your cervix. A medicine to clean your cervix may be used to help lower your risk of infection. You may be given medicine to numb your cervix. This medicine is usually given by an injection into your cervix. A tool will be put into your uterus to check the length of your uterus. A thin tube that holds the IUD will be put into your vagina, through the opening of your cervix, and into your uterus. The IUD will be placed in your uterus. The tube that holds the IUD will be removed. The strings that are attached to the IUD will be trimmed so that they sit just outside your cervix. The speculum will be removed. These steps may vary. Ask what you can expect. What happens after? You may have: Bleeding. It can vary from light bleeding or spotting for a few days to period-like bleeding. This is normal. Cramps and pain in your belly. Dizziness or light-headedness. Pain in your lower back. Headaches and the feeling like you may throw up Follow these instructions at home: Do not have sex or put anything into your vagina for 24 hours after the IUD is placed. Before having sex, check to make sure that you can feel the IUD string or strings. You should be able to feel the end of the string below the opening of your cervix. If your IUD string is in place, you may continue with sex. If you had a hormonal IUD put in more than 7 days after your most recent period  started, you will need to use a backup method of birth control for 7 days after the IUD was placed. Hormones are chemicals that affect how the body works. Check that the IUD is still in place by feeling for the strings after every period, or check once a month. Use a condom every time you have sex to prevent STIs. An IUD won't protect you from STIs. Take your medicines only as told. Contact a health care provider if: You have any of the following problems with your IUD string or strings: The string  bothers or hurts you or your sexual partner. You can't feel the string. The string has gotten longer. The IUD comes out or you can feel the IUD in your vagina. You think you may be pregnant, or you miss your period. You think you may have an STI. You have bad-smelling discharge from your vagina. You have a fever and chills. You have pain during sex. Get help right away if: You have heavy bleeding, which means soaking more than 2 pads per hour for 2 hours in a row. You have sudden, really bad belly pain. This information is not intended to replace advice given to you by your health care provider. Make sure you discuss any questions you have with your health care provider. Document Revised: 12/30/2022 Document Reviewed: 12/30/2022 [employer] Patient Education  2024 [employer].

## 2023-12-16 ENCOUNTER — Encounter: Payer: Self-pay | Admitting: Obstetrics and Gynecology

## 2023-12-27 ENCOUNTER — Other Ambulatory Visit: Payer: Self-pay | Admitting: Nurse Practitioner

## 2023-12-27 ENCOUNTER — Other Ambulatory Visit (HOSPITAL_COMMUNITY): Payer: Self-pay

## 2023-12-27 DIAGNOSIS — E119 Type 2 diabetes mellitus without complications: Secondary | ICD-10-CM

## 2023-12-27 DIAGNOSIS — I10 Essential (primary) hypertension: Secondary | ICD-10-CM

## 2023-12-29 ENCOUNTER — Other Ambulatory Visit: Payer: Self-pay | Admitting: Nurse Practitioner

## 2023-12-29 ENCOUNTER — Encounter: Payer: Self-pay | Admitting: Pharmacist

## 2023-12-29 ENCOUNTER — Other Ambulatory Visit: Payer: Self-pay

## 2023-12-29 ENCOUNTER — Other Ambulatory Visit (HOSPITAL_COMMUNITY): Payer: Self-pay

## 2023-12-29 MED ORDER — MOUNJARO 15 MG/0.5ML ~~LOC~~ SOAJ
15.0000 mg | SUBCUTANEOUS | 1 refills | Status: DC
  Filled 2023-12-29: qty 6, 84d supply, fill #0
  Filled 2024-03-17: qty 6, 84d supply, fill #1

## 2023-12-29 MED ORDER — PANTOPRAZOLE SODIUM 40 MG PO TBEC
40.0000 mg | DELAYED_RELEASE_TABLET | Freq: Every day | ORAL | 1 refills | Status: AC
  Filled 2023-12-29: qty 90, 90d supply, fill #0
  Filled 2024-03-17: qty 90, 90d supply, fill #1

## 2023-12-29 MED ORDER — FUROSEMIDE 80 MG PO TABS
80.0000 mg | ORAL_TABLET | Freq: Every day | ORAL | 1 refills | Status: AC
  Filled 2023-12-29: qty 90, 90d supply, fill #0
  Filled 2024-03-17: qty 90, 90d supply, fill #1

## 2023-12-29 MED ORDER — METFORMIN HCL ER 500 MG PO TB24
1500.0000 mg | ORAL_TABLET | Freq: Every day | ORAL | 1 refills | Status: AC
  Filled 2023-12-29: qty 270, 90d supply, fill #0
  Filled 2024-06-08: qty 270, 90d supply, fill #1

## 2024-01-01 ENCOUNTER — Other Ambulatory Visit: Payer: Self-pay

## 2024-02-04 ENCOUNTER — Ambulatory Visit: Admitting: Obstetrics and Gynecology

## 2024-03-16 ENCOUNTER — Ambulatory Visit: Admitting: Obstetrics and Gynecology

## 2024-03-17 ENCOUNTER — Other Ambulatory Visit: Payer: Self-pay

## 2024-04-14 ENCOUNTER — Encounter: Payer: Self-pay | Admitting: Obstetrics and Gynecology

## 2024-05-01 ENCOUNTER — Other Ambulatory Visit

## 2024-05-02 ENCOUNTER — Encounter: Payer: Self-pay | Admitting: Family Medicine

## 2024-06-03 ENCOUNTER — Other Ambulatory Visit: Payer: Self-pay | Admitting: Nurse Practitioner

## 2024-06-03 MED ORDER — ALBUTEROL SULFATE HFA 108 (90 BASE) MCG/ACT IN AERS: 2.0000 | INHALATION_SPRAY | RESPIRATORY_TRACT | 2 refills | Status: AC | PRN

## 2024-06-05 ENCOUNTER — Other Ambulatory Visit

## 2024-06-08 ENCOUNTER — Encounter: Payer: Self-pay | Admitting: Family Medicine

## 2024-06-08 ENCOUNTER — Other Ambulatory Visit: Payer: Self-pay | Admitting: Family Medicine

## 2024-06-08 ENCOUNTER — Other Ambulatory Visit: Payer: Self-pay

## 2024-06-08 ENCOUNTER — Other Ambulatory Visit: Payer: Self-pay | Admitting: Nurse Practitioner

## 2024-06-08 DIAGNOSIS — D500 Iron deficiency anemia secondary to blood loss (chronic): Secondary | ICD-10-CM

## 2024-06-08 DIAGNOSIS — I10 Essential (primary) hypertension: Secondary | ICD-10-CM

## 2024-06-08 DIAGNOSIS — E119 Type 2 diabetes mellitus without complications: Secondary | ICD-10-CM

## 2024-06-08 DIAGNOSIS — E1169 Type 2 diabetes mellitus with other specified complication: Secondary | ICD-10-CM

## 2024-06-08 DIAGNOSIS — Z79899 Other long term (current) drug therapy: Secondary | ICD-10-CM

## 2024-06-08 MED ORDER — MOUNJARO 15 MG/0.5ML ~~LOC~~ SOAJ
15.0000 mg | SUBCUTANEOUS | 1 refills | Status: AC
  Filled 2024-06-08: qty 6, 84d supply, fill #0

## 2024-06-09 ENCOUNTER — Other Ambulatory Visit: Payer: Self-pay

## 2024-06-22 ENCOUNTER — Ambulatory Visit: Admitting: Nurse Practitioner

## 2024-07-24 ENCOUNTER — Other Ambulatory Visit

## 2024-11-03 ENCOUNTER — Ambulatory Visit: Admitting: Obstetrics and Gynecology
